# Patient Record
Sex: Female | Born: 1945
Health system: Southern US, Community
[De-identification: ages and names within clinical notes are randomized; demographics above are authoritative.]

## PROBLEM LIST (undated history)

## (undated) DIAGNOSIS — C50919 Malignant neoplasm of unspecified site of unspecified female breast: Secondary | ICD-10-CM

## (undated) DIAGNOSIS — Z923 Personal history of irradiation: Secondary | ICD-10-CM

## (undated) DIAGNOSIS — Z853 Personal history of malignant neoplasm of breast: Secondary | ICD-10-CM

## (undated) DIAGNOSIS — R569 Unspecified convulsions: Secondary | ICD-10-CM

## (undated) DIAGNOSIS — I10 Essential (primary) hypertension: Secondary | ICD-10-CM

## (undated) DIAGNOSIS — Z803 Family history of malignant neoplasm of breast: Secondary | ICD-10-CM

## (undated) DIAGNOSIS — Z9221 Personal history of antineoplastic chemotherapy: Secondary | ICD-10-CM

## (undated) DIAGNOSIS — T7840XA Allergy, unspecified, initial encounter: Secondary | ICD-10-CM

## (undated) DIAGNOSIS — Z8041 Family history of malignant neoplasm of ovary: Secondary | ICD-10-CM

## (undated) DIAGNOSIS — C801 Malignant (primary) neoplasm, unspecified: Secondary | ICD-10-CM

## (undated) HISTORY — PX: BREAST SURGERY: SHX581

## (undated) HISTORY — DX: Family history of malignant neoplasm of breast: Z80.3

## (undated) HISTORY — DX: Family history of malignant neoplasm of ovary: Z80.41

## (undated) HISTORY — PX: ABDOMINAL HYSTERECTOMY: SHX81

## (undated) HISTORY — DX: Malignant (primary) neoplasm, unspecified: C80.1

## (undated) HISTORY — DX: Allergy, unspecified, initial encounter: T78.40XA

## (undated) HISTORY — DX: Essential (primary) hypertension: I10

## (undated) HISTORY — PX: JOINT REPLACEMENT: SHX530

## (undated) HISTORY — DX: Unspecified convulsions: R56.9

## (undated) HISTORY — PX: SPINE SURGERY: SHX786

## (undated) HISTORY — PX: BREAST EXCISIONAL BIOPSY: SUR124

## (undated) HISTORY — DX: Personal history of malignant neoplasm of breast: Z85.3

---

## 1998-05-24 ENCOUNTER — Ambulatory Visit (HOSPITAL_COMMUNITY): Admission: RE | Admit: 1998-05-24 | Discharge: 1998-05-24 | Payer: Self-pay | Admitting: Internal Medicine

## 1998-06-14 ENCOUNTER — Inpatient Hospital Stay (HOSPITAL_COMMUNITY): Admission: RE | Admit: 1998-06-14 | Discharge: 1998-06-16 | Payer: Self-pay | Admitting: Neurosurgery

## 1999-02-15 ENCOUNTER — Ambulatory Visit (HOSPITAL_COMMUNITY): Admission: RE | Admit: 1999-02-15 | Discharge: 1999-02-15 | Payer: Self-pay | Admitting: Gynecology

## 1999-02-15 ENCOUNTER — Encounter: Payer: Self-pay | Admitting: Gynecology

## 1999-05-31 ENCOUNTER — Encounter: Payer: Self-pay | Admitting: Emergency Medicine

## 1999-05-31 ENCOUNTER — Emergency Department (HOSPITAL_COMMUNITY): Admission: EM | Admit: 1999-05-31 | Discharge: 1999-05-31 | Payer: Self-pay | Admitting: Emergency Medicine

## 1999-06-21 ENCOUNTER — Encounter: Admission: RE | Admit: 1999-06-21 | Discharge: 1999-08-05 | Payer: Self-pay | Admitting: Neurosurgery

## 1999-09-20 ENCOUNTER — Encounter: Payer: Self-pay | Admitting: Emergency Medicine

## 1999-09-20 ENCOUNTER — Inpatient Hospital Stay (HOSPITAL_COMMUNITY): Admission: EM | Admit: 1999-09-20 | Discharge: 1999-09-22 | Payer: Self-pay | Admitting: Emergency Medicine

## 1999-09-21 ENCOUNTER — Encounter: Payer: Self-pay | Admitting: Internal Medicine

## 2000-02-20 ENCOUNTER — Encounter: Payer: Self-pay | Admitting: Internal Medicine

## 2000-02-20 ENCOUNTER — Ambulatory Visit (HOSPITAL_COMMUNITY): Admission: RE | Admit: 2000-02-20 | Discharge: 2000-02-20 | Payer: Self-pay | Admitting: Internal Medicine

## 2001-05-27 ENCOUNTER — Ambulatory Visit (HOSPITAL_COMMUNITY): Admission: RE | Admit: 2001-05-27 | Discharge: 2001-05-27 | Payer: Self-pay | Admitting: Gynecology

## 2001-05-27 ENCOUNTER — Encounter: Payer: Self-pay | Admitting: Gynecology

## 2001-06-06 ENCOUNTER — Other Ambulatory Visit: Admission: RE | Admit: 2001-06-06 | Discharge: 2001-06-06 | Payer: Self-pay | Admitting: Gynecology

## 2001-06-27 ENCOUNTER — Encounter: Payer: Self-pay | Admitting: Internal Medicine

## 2001-06-27 ENCOUNTER — Ambulatory Visit (HOSPITAL_COMMUNITY): Admission: RE | Admit: 2001-06-27 | Discharge: 2001-06-27 | Payer: Self-pay | Admitting: Internal Medicine

## 2003-01-27 ENCOUNTER — Ambulatory Visit (HOSPITAL_COMMUNITY): Admission: RE | Admit: 2003-01-27 | Discharge: 2003-01-27 | Payer: Self-pay | Admitting: Internal Medicine

## 2003-01-27 ENCOUNTER — Encounter: Payer: Self-pay | Admitting: Internal Medicine

## 2003-05-21 ENCOUNTER — Other Ambulatory Visit: Admission: RE | Admit: 2003-05-21 | Discharge: 2003-05-21 | Payer: Self-pay | Admitting: Gynecology

## 2003-05-21 ENCOUNTER — Ambulatory Visit (HOSPITAL_COMMUNITY): Admission: RE | Admit: 2003-05-21 | Discharge: 2003-05-21 | Payer: Self-pay | Admitting: Gynecology

## 2003-05-21 ENCOUNTER — Encounter: Payer: Self-pay | Admitting: Gynecology

## 2003-11-22 ENCOUNTER — Emergency Department (HOSPITAL_COMMUNITY): Admission: AD | Admit: 2003-11-22 | Discharge: 2003-11-22 | Payer: Self-pay | Admitting: Internal Medicine

## 2004-07-04 ENCOUNTER — Ambulatory Visit (HOSPITAL_COMMUNITY): Admission: RE | Admit: 2004-07-04 | Discharge: 2004-07-04 | Payer: Self-pay | Admitting: Gynecology

## 2004-09-29 ENCOUNTER — Emergency Department (HOSPITAL_COMMUNITY): Admission: EM | Admit: 2004-09-29 | Discharge: 2004-09-29 | Payer: Self-pay | Admitting: Family Medicine

## 2004-11-17 ENCOUNTER — Other Ambulatory Visit: Admission: RE | Admit: 2004-11-17 | Discharge: 2004-11-17 | Payer: Self-pay | Admitting: Gynecology

## 2005-02-01 ENCOUNTER — Encounter: Admission: RE | Admit: 2005-02-01 | Discharge: 2005-03-28 | Payer: Self-pay | Admitting: Orthopedic Surgery

## 2005-03-30 ENCOUNTER — Encounter: Admission: RE | Admit: 2005-03-30 | Discharge: 2005-03-30 | Payer: Self-pay | Admitting: Orthopaedic Surgery

## 2005-06-21 ENCOUNTER — Encounter: Admission: RE | Admit: 2005-06-21 | Discharge: 2005-07-13 | Payer: Self-pay | Admitting: Orthopedic Surgery

## 2005-07-14 ENCOUNTER — Ambulatory Visit (HOSPITAL_COMMUNITY): Admission: RE | Admit: 2005-07-14 | Discharge: 2005-07-14 | Payer: Self-pay | Admitting: Gynecology

## 2005-12-10 ENCOUNTER — Emergency Department (HOSPITAL_COMMUNITY): Admission: EM | Admit: 2005-12-10 | Discharge: 2005-12-10 | Payer: Self-pay | Admitting: Family Medicine

## 2005-12-10 ENCOUNTER — Emergency Department (HOSPITAL_COMMUNITY): Admission: EM | Admit: 2005-12-10 | Discharge: 2005-12-11 | Payer: Self-pay | Admitting: Emergency Medicine

## 2005-12-27 ENCOUNTER — Encounter: Payer: Self-pay | Admitting: Internal Medicine

## 2005-12-27 ENCOUNTER — Encounter: Payer: Self-pay | Admitting: Neurosurgery

## 2006-06-08 ENCOUNTER — Ambulatory Visit (HOSPITAL_COMMUNITY): Admission: RE | Admit: 2006-06-08 | Discharge: 2006-06-08 | Payer: Self-pay | Admitting: Internal Medicine

## 2006-07-05 ENCOUNTER — Inpatient Hospital Stay (HOSPITAL_COMMUNITY): Admission: RE | Admit: 2006-07-05 | Discharge: 2006-07-09 | Payer: Self-pay | Admitting: Orthopedic Surgery

## 2006-09-04 ENCOUNTER — Ambulatory Visit (HOSPITAL_COMMUNITY): Admission: RE | Admit: 2006-09-04 | Discharge: 2006-09-04 | Payer: Self-pay | Admitting: Gynecology

## 2006-09-06 ENCOUNTER — Other Ambulatory Visit: Admission: RE | Admit: 2006-09-06 | Discharge: 2006-09-06 | Payer: Self-pay | Admitting: Gynecology

## 2006-09-12 ENCOUNTER — Encounter: Admission: RE | Admit: 2006-09-12 | Discharge: 2006-09-12 | Payer: Self-pay | Admitting: Gynecology

## 2006-10-01 ENCOUNTER — Ambulatory Visit (HOSPITAL_COMMUNITY): Admission: RE | Admit: 2006-10-01 | Discharge: 2006-10-01 | Payer: Self-pay | Admitting: Internal Medicine

## 2007-03-08 ENCOUNTER — Encounter: Admission: RE | Admit: 2007-03-08 | Discharge: 2007-03-08 | Payer: Self-pay | Admitting: Gynecology

## 2007-04-12 ENCOUNTER — Ambulatory Visit (HOSPITAL_COMMUNITY): Admission: RE | Admit: 2007-04-12 | Discharge: 2007-04-12 | Payer: Self-pay | Admitting: Internal Medicine

## 2007-04-14 ENCOUNTER — Emergency Department (HOSPITAL_COMMUNITY): Admission: EM | Admit: 2007-04-14 | Discharge: 2007-04-14 | Payer: Self-pay | Admitting: Emergency Medicine

## 2007-08-29 HISTORY — PX: BREAST LUMPECTOMY: SHX2

## 2008-03-08 ENCOUNTER — Emergency Department (HOSPITAL_COMMUNITY): Admission: EM | Admit: 2008-03-08 | Discharge: 2008-03-08 | Payer: Self-pay | Admitting: Family Medicine

## 2008-03-12 ENCOUNTER — Encounter (INDEPENDENT_AMBULATORY_CARE_PROVIDER_SITE_OTHER): Payer: Self-pay | Admitting: Gynecology

## 2008-03-12 ENCOUNTER — Encounter: Admission: RE | Admit: 2008-03-12 | Discharge: 2008-03-12 | Payer: Self-pay | Admitting: Gynecology

## 2008-03-19 ENCOUNTER — Encounter: Admission: RE | Admit: 2008-03-19 | Discharge: 2008-03-19 | Payer: Self-pay | Admitting: Gynecology

## 2008-04-06 ENCOUNTER — Encounter: Admission: RE | Admit: 2008-04-06 | Discharge: 2008-04-06 | Payer: Self-pay | Admitting: Surgery

## 2008-04-08 ENCOUNTER — Ambulatory Visit (HOSPITAL_BASED_OUTPATIENT_CLINIC_OR_DEPARTMENT_OTHER): Admission: RE | Admit: 2008-04-08 | Discharge: 2008-04-08 | Payer: Self-pay | Admitting: Surgery

## 2008-04-08 ENCOUNTER — Encounter: Admission: RE | Admit: 2008-04-08 | Discharge: 2008-04-08 | Payer: Self-pay | Admitting: Surgery

## 2008-04-08 ENCOUNTER — Encounter (INDEPENDENT_AMBULATORY_CARE_PROVIDER_SITE_OTHER): Payer: Self-pay | Admitting: Surgery

## 2008-04-09 ENCOUNTER — Ambulatory Visit: Payer: Self-pay | Admitting: Oncology

## 2008-04-21 ENCOUNTER — Ambulatory Visit: Admission: RE | Admit: 2008-04-21 | Discharge: 2008-07-20 | Payer: Self-pay | Admitting: Radiation Oncology

## 2008-04-22 LAB — CBC WITH DIFFERENTIAL/PLATELET
BASO%: 0.5 % (ref 0.0–2.0)
Basophils Absolute: 0 10*3/uL (ref 0.0–0.1)
EOS%: 1.1 % (ref 0.0–7.0)
MCH: 31.2 pg (ref 26.0–34.0)
MCHC: 33.6 g/dL (ref 32.0–36.0)
MCV: 92.8 fL (ref 81.0–101.0)
MONO%: 4.6 % (ref 0.0–13.0)
RBC: 4.4 10*6/uL (ref 3.70–5.32)
RDW: 12.3 % (ref 11.3–14.5)

## 2008-04-23 LAB — COMPREHENSIVE METABOLIC PANEL
AST: 12 U/L (ref 0–37)
Albumin: 4.1 g/dL (ref 3.5–5.2)
Alkaline Phosphatase: 78 U/L (ref 39–117)
BUN: 14 mg/dL (ref 6–23)
Potassium: 4 mEq/L (ref 3.5–5.3)

## 2008-04-23 LAB — VITAMIN D 25 HYDROXY (VIT D DEFICIENCY, FRACTURES): Vit D, 25-Hydroxy: 19 ng/mL — ABNORMAL LOW (ref 30–89)

## 2008-04-24 ENCOUNTER — Ambulatory Visit (HOSPITAL_COMMUNITY): Admission: RE | Admit: 2008-04-24 | Discharge: 2008-04-24 | Payer: Self-pay | Admitting: Oncology

## 2008-05-08 ENCOUNTER — Ambulatory Visit: Payer: Self-pay

## 2008-05-08 ENCOUNTER — Encounter: Payer: Self-pay | Admitting: Oncology

## 2008-05-11 ENCOUNTER — Ambulatory Visit (HOSPITAL_COMMUNITY): Admission: RE | Admit: 2008-05-11 | Discharge: 2008-05-11 | Payer: Self-pay | Admitting: Surgery

## 2008-05-20 ENCOUNTER — Emergency Department (HOSPITAL_COMMUNITY): Admission: EM | Admit: 2008-05-20 | Discharge: 2008-05-20 | Payer: Self-pay | Admitting: Emergency Medicine

## 2008-05-28 ENCOUNTER — Ambulatory Visit: Payer: Self-pay | Admitting: Oncology

## 2008-05-28 ENCOUNTER — Ambulatory Visit (HOSPITAL_COMMUNITY): Admission: RE | Admit: 2008-05-28 | Discharge: 2008-05-28 | Payer: Self-pay | Admitting: Oncology

## 2008-05-28 LAB — CBC WITH DIFFERENTIAL/PLATELET
BASO%: 0.9 % (ref 0.0–2.0)
Eosinophils Absolute: 0 10*3/uL (ref 0.0–0.5)
HCT: 39.2 % (ref 34.8–46.6)
HGB: 13.2 g/dL (ref 11.6–15.9)
LYMPH%: 25.8 % (ref 14.0–48.0)
MCH: 30.5 pg (ref 26.0–34.0)
MONO%: 5.3 % (ref 0.0–13.0)
Platelets: 157 10*3/uL (ref 145–400)

## 2008-06-01 ENCOUNTER — Ambulatory Visit (HOSPITAL_COMMUNITY): Admission: RE | Admit: 2008-06-01 | Discharge: 2008-06-01 | Payer: Self-pay | Admitting: General Surgery

## 2008-06-11 LAB — CBC WITH DIFFERENTIAL/PLATELET
BASO%: 1 % (ref 0.0–2.0)
Basophils Absolute: 0.1 10*3/uL (ref 0.0–0.1)
EOS%: 0.5 % (ref 0.0–7.0)
HCT: 36 % (ref 34.8–46.6)
HGB: 12.2 g/dL (ref 11.6–15.9)
LYMPH%: 25.2 % (ref 14.0–48.0)
MCH: 31.1 pg (ref 26.0–34.0)
MCHC: 33.9 g/dL (ref 32.0–36.0)
MCV: 91.8 fL (ref 81.0–101.0)
MONO%: 6.5 % (ref 0.0–13.0)
NEUT%: 66.8 % (ref 39.6–76.8)
Platelets: 90 10*3/uL — ABNORMAL LOW (ref 145–400)
lymph#: 2 10*3/uL (ref 0.9–3.3)

## 2008-06-18 LAB — CBC WITH DIFFERENTIAL/PLATELET
BASO%: 0.9 % (ref 0.0–2.0)
Basophils Absolute: 0 10*3/uL (ref 0.0–0.1)
EOS%: 0.3 % (ref 0.0–7.0)
HGB: 12.6 g/dL (ref 11.6–15.9)
MCH: 31.7 pg (ref 26.0–34.0)
MCHC: 33.6 g/dL (ref 32.0–36.0)
MCV: 94.1 fL (ref 81.0–101.0)
MONO%: 4.8 % (ref 0.0–13.0)
RDW: 14.9 % — ABNORMAL HIGH (ref 11.3–14.5)

## 2008-06-25 LAB — CBC WITH DIFFERENTIAL/PLATELET
EOS%: 0.2 % (ref 0.0–7.0)
Eosinophils Absolute: 0 10*3/uL (ref 0.0–0.5)
MCH: 31.8 pg (ref 26.0–34.0)
MCV: 95.4 fL (ref 81.0–101.0)
MONO%: 5.2 % (ref 0.0–13.0)
NEUT#: 3.9 10*3/uL (ref 1.5–6.5)
RBC: 4 10*6/uL (ref 3.70–5.32)
RDW: 15.3 % — ABNORMAL HIGH (ref 11.3–14.5)
lymph#: 1.6 10*3/uL (ref 0.9–3.3)

## 2008-06-25 LAB — COMPREHENSIVE METABOLIC PANEL
ALT: 19 U/L (ref 0–35)
AST: 20 U/L (ref 0–37)
Albumin: 4.2 g/dL (ref 3.5–5.2)
Alkaline Phosphatase: 84 U/L (ref 39–117)
Potassium: 3.9 mEq/L (ref 3.5–5.3)
Sodium: 138 mEq/L (ref 135–145)
Total Protein: 6.7 g/dL (ref 6.0–8.3)

## 2008-07-02 LAB — CBC WITH DIFFERENTIAL/PLATELET
BASO%: 1.6 % (ref 0.0–2.0)
Eosinophils Absolute: 0.1 10*3/uL (ref 0.0–0.5)
LYMPH%: 42.9 % (ref 14.0–48.0)
MCHC: 33.4 g/dL (ref 32.0–36.0)
MONO#: 0.3 10*3/uL (ref 0.1–0.9)
NEUT#: 2.8 10*3/uL (ref 1.5–6.5)
RBC: 3.93 10*6/uL (ref 3.70–5.32)
RDW: 14.5 % (ref 11.3–14.5)
WBC: 5.7 10*3/uL (ref 3.9–10.0)

## 2008-07-09 LAB — CBC WITH DIFFERENTIAL/PLATELET
BASO%: 1.3 % (ref 0.0–2.0)
Eosinophils Absolute: 0 10*3/uL (ref 0.0–0.5)
HCT: 37.1 % (ref 34.8–46.6)
LYMPH%: 44.8 % (ref 14.0–48.0)
MCHC: 33.7 g/dL (ref 32.0–36.0)
MONO#: 0.3 10*3/uL (ref 0.1–0.9)
NEUT#: 2.4 10*3/uL (ref 1.5–6.5)
NEUT%: 47.1 % (ref 39.6–76.8)
Platelets: 289 10*3/uL (ref 145–400)
RBC: 3.86 10*6/uL (ref 3.70–5.32)
WBC: 5 10*3/uL (ref 3.9–10.0)
lymph#: 2.2 10*3/uL (ref 0.9–3.3)

## 2008-07-14 ENCOUNTER — Ambulatory Visit: Payer: Self-pay | Admitting: Oncology

## 2008-07-16 LAB — CBC WITH DIFFERENTIAL/PLATELET
Basophils Absolute: 0.1 10*3/uL (ref 0.0–0.1)
HCT: 34.9 % (ref 34.8–46.6)
HGB: 11.9 g/dL (ref 11.6–15.9)
LYMPH%: 40.5 % (ref 14.0–48.0)
MONO#: 0.3 10*3/uL (ref 0.1–0.9)
NEUT%: 52.5 % (ref 39.6–76.8)
Platelets: 230 10*3/uL (ref 145–400)
WBC: 5.5 10*3/uL (ref 3.9–10.0)
lymph#: 2.2 10*3/uL (ref 0.9–3.3)

## 2008-07-16 LAB — COMPREHENSIVE METABOLIC PANEL
ALT: 26 U/L (ref 0–35)
BUN: 16 mg/dL (ref 6–23)
CO2: 24 mEq/L (ref 19–32)
Calcium: 8.9 mg/dL (ref 8.4–10.5)
Chloride: 104 mEq/L (ref 96–112)
Creatinine, Ser: 0.96 mg/dL (ref 0.40–1.20)
Glucose, Bld: 152 mg/dL — ABNORMAL HIGH (ref 70–99)
Total Bilirubin: 0.3 mg/dL (ref 0.3–1.2)

## 2008-07-21 LAB — CBC WITH DIFFERENTIAL/PLATELET
BASO%: 1.5 % (ref 0.0–2.0)
Eosinophils Absolute: 0.1 10*3/uL (ref 0.0–0.5)
HCT: 36 % (ref 34.8–46.6)
HGB: 12.4 g/dL (ref 11.6–15.9)
MCHC: 34.3 g/dL (ref 32.0–36.0)
MONO#: 0.4 10*3/uL (ref 0.1–0.9)
NEUT#: 2.3 10*3/uL (ref 1.5–6.5)
NEUT%: 46.3 % (ref 39.6–76.8)
WBC: 4.9 10*3/uL (ref 3.9–10.0)
lymph#: 2.1 10*3/uL (ref 0.9–3.3)

## 2008-07-30 LAB — CBC WITH DIFFERENTIAL/PLATELET
Basophils Absolute: 0 10*3/uL (ref 0.0–0.1)
HCT: 33.9 % — ABNORMAL LOW (ref 34.8–46.6)
HGB: 11.8 g/dL (ref 11.6–15.9)
LYMPH%: 35.6 % (ref 14.0–48.0)
MONO#: 0.2 10*3/uL (ref 0.1–0.9)
NEUT%: 58.5 % (ref 39.6–76.8)
Platelets: 84 10*3/uL — ABNORMAL LOW (ref 145–400)
WBC: 5.1 10*3/uL (ref 3.9–10.0)
lymph#: 1.8 10*3/uL (ref 0.9–3.3)

## 2008-08-06 ENCOUNTER — Ambulatory Visit: Admission: RE | Admit: 2008-08-06 | Discharge: 2008-09-03 | Payer: Self-pay | Admitting: Radiation Oncology

## 2008-08-13 LAB — CBC WITH DIFFERENTIAL/PLATELET
Basophils Absolute: 0 10*3/uL (ref 0.0–0.1)
EOS%: 0.8 % (ref 0.0–7.0)
HCT: 30.9 % — ABNORMAL LOW (ref 34.8–46.6)
HGB: 10.6 g/dL — ABNORMAL LOW (ref 11.6–15.9)
LYMPH%: 63 % — ABNORMAL HIGH (ref 14.0–48.0)
MCH: 33.9 pg (ref 26.0–34.0)
MCV: 98.8 fL (ref 81.0–101.0)
MONO%: 4.9 % (ref 0.0–13.0)
NEUT%: 30.3 % — ABNORMAL LOW (ref 39.6–76.8)
Platelets: 307 10*3/uL (ref 145–400)

## 2008-08-19 ENCOUNTER — Ambulatory Visit: Payer: Self-pay

## 2008-08-19 ENCOUNTER — Encounter: Payer: Self-pay | Admitting: Oncology

## 2008-09-01 ENCOUNTER — Ambulatory Visit: Payer: Self-pay | Admitting: Oncology

## 2008-09-03 ENCOUNTER — Ambulatory Visit: Admission: RE | Admit: 2008-09-03 | Discharge: 2008-11-05 | Payer: Self-pay | Admitting: Radiation Oncology

## 2008-09-03 LAB — CBC WITH DIFFERENTIAL/PLATELET
BASO%: 0.5 % (ref 0.0–2.0)
EOS%: 0.4 % (ref 0.0–7.0)
HCT: 36.9 % (ref 34.8–46.6)
MCHC: 33.5 g/dL (ref 32.0–36.0)
MONO#: 0.4 10*3/uL (ref 0.1–0.9)
RBC: 3.49 10*6/uL — ABNORMAL LOW (ref 3.70–5.32)
RDW: 16.7 % — ABNORMAL HIGH (ref 11.3–14.5)
WBC: 6.7 10*3/uL (ref 3.9–10.0)
lymph#: 2.1 10*3/uL (ref 0.9–3.3)

## 2008-09-03 LAB — COMPREHENSIVE METABOLIC PANEL
ALT: 21 U/L (ref 0–35)
Albumin: 4.3 g/dL (ref 3.5–5.2)
CO2: 23 mEq/L (ref 19–32)
Calcium: 9.6 mg/dL (ref 8.4–10.5)
Chloride: 102 mEq/L (ref 96–112)
Creatinine, Ser: 0.83 mg/dL (ref 0.40–1.20)
Potassium: 3.9 mEq/L (ref 3.5–5.3)
Total Protein: 7 g/dL (ref 6.0–8.3)

## 2008-09-10 LAB — CBC WITH DIFFERENTIAL/PLATELET
Basophils Absolute: 0 10*3/uL (ref 0.0–0.1)
HCT: 37.7 % (ref 34.8–46.6)
HGB: 12.6 g/dL (ref 11.6–15.9)
MONO#: 0.1 10*3/uL (ref 0.1–0.9)
NEUT#: 5.5 10*3/uL (ref 1.5–6.5)
NEUT%: 72.5 % (ref 39.6–76.8)
RDW: 15.5 % — ABNORMAL HIGH (ref 11.3–14.5)
WBC: 7.6 10*3/uL (ref 3.9–10.0)
lymph#: 1.9 10*3/uL (ref 0.9–3.3)

## 2008-09-17 LAB — CBC WITH DIFFERENTIAL/PLATELET
BASO%: 0.1 % (ref 0.0–2.0)
Basophils Absolute: 0 10*3/uL (ref 0.0–0.1)
Eosinophils Absolute: 0.1 10*3/uL (ref 0.0–0.5)
HCT: 38.7 % (ref 34.8–46.6)
HGB: 12.9 g/dL (ref 11.6–15.9)
LYMPH%: 25.4 % (ref 14.0–48.0)
MCHC: 33.3 g/dL (ref 32.0–36.0)
MONO#: 0.3 10*3/uL (ref 0.1–0.9)
NEUT#: 5.7 10*3/uL (ref 1.5–6.5)
NEUT%: 69.5 % (ref 39.6–76.8)
Platelets: 212 10*3/uL (ref 145–400)
WBC: 8.2 10*3/uL (ref 3.9–10.0)
lymph#: 2.1 10*3/uL (ref 0.9–3.3)

## 2008-09-24 LAB — CBC WITH DIFFERENTIAL/PLATELET
BASO%: 0.4 % (ref 0.0–2.0)
Basophils Absolute: 0 10*3/uL (ref 0.0–0.1)
EOS%: 1.3 % (ref 0.0–7.0)
HCT: 37.8 % (ref 34.8–46.6)
HGB: 12.6 g/dL (ref 11.6–15.9)
LYMPH%: 24.6 % (ref 14.0–48.0)
MCH: 34.6 pg — ABNORMAL HIGH (ref 26.0–34.0)
MCHC: 33.5 g/dL (ref 32.0–36.0)
MCV: 103.4 fL — ABNORMAL HIGH (ref 81.0–101.0)
NEUT%: 65.9 % (ref 39.6–76.8)
Platelets: 174 10*3/uL (ref 145–400)
lymph#: 1.8 10*3/uL (ref 0.9–3.3)

## 2008-09-24 LAB — COMPREHENSIVE METABOLIC PANEL
AST: 19 U/L (ref 0–37)
BUN: 17 mg/dL (ref 6–23)
Calcium: 9.5 mg/dL (ref 8.4–10.5)
Chloride: 106 mEq/L (ref 96–112)
Creatinine, Ser: 1 mg/dL (ref 0.40–1.20)
Total Bilirubin: 0.2 mg/dL — ABNORMAL LOW (ref 0.3–1.2)

## 2008-10-15 LAB — CBC WITH DIFFERENTIAL/PLATELET
Eosinophils Absolute: 0.1 10*3/uL (ref 0.0–0.5)
HCT: 38.3 % (ref 34.8–46.6)
LYMPH%: 20 % (ref 14.0–48.0)
MONO#: 0.3 10*3/uL (ref 0.1–0.9)
NEUT#: 3.5 10*3/uL (ref 1.5–6.5)
NEUT%: 71.6 % (ref 39.6–76.8)
Platelets: 159 10*3/uL (ref 145–400)
WBC: 4.8 10*3/uL (ref 3.9–10.0)

## 2008-10-20 ENCOUNTER — Ambulatory Visit: Payer: Self-pay | Admitting: Oncology

## 2008-10-22 LAB — COMPREHENSIVE METABOLIC PANEL
AST: 13 U/L (ref 0–37)
Alkaline Phosphatase: 78 U/L (ref 39–117)
BUN: 17 mg/dL (ref 6–23)
Calcium: 9.6 mg/dL (ref 8.4–10.5)
Chloride: 103 mEq/L (ref 96–112)
Creatinine, Ser: 0.79 mg/dL (ref 0.40–1.20)

## 2008-10-22 LAB — CBC WITH DIFFERENTIAL/PLATELET
BASO%: 0.6 % (ref 0.0–2.0)
EOS%: 1 % (ref 0.0–7.0)
HCT: 38.4 % (ref 34.8–46.6)
LYMPH%: 18.8 % (ref 14.0–49.7)
MCH: 32.1 pg (ref 25.1–34.0)
MCHC: 33.6 g/dL (ref 31.5–36.0)
NEUT%: 71.8 % (ref 38.4–76.8)
Platelets: 159 10*3/uL (ref 145–400)

## 2008-10-29 LAB — CBC WITH DIFFERENTIAL/PLATELET
Basophils Absolute: 0 10*3/uL (ref 0.0–0.1)
EOS%: 1.1 % (ref 0.0–7.0)
Eosinophils Absolute: 0.1 10*3/uL (ref 0.0–0.5)
HCT: 40.2 % (ref 34.8–46.6)
HGB: 13.6 g/dL (ref 11.6–15.9)
MCH: 33.2 pg (ref 25.1–34.0)
NEUT#: 3.8 10*3/uL (ref 1.5–6.5)
NEUT%: 71.6 % (ref 38.4–76.8)
lymph#: 1.2 10*3/uL (ref 0.9–3.3)

## 2008-11-10 ENCOUNTER — Encounter: Payer: Self-pay | Admitting: Oncology

## 2008-11-10 ENCOUNTER — Ambulatory Visit: Admission: RE | Admit: 2008-11-10 | Discharge: 2008-11-10 | Payer: Self-pay | Admitting: Oncology

## 2008-11-10 ENCOUNTER — Ambulatory Visit: Payer: Self-pay | Admitting: Cardiology

## 2008-11-12 LAB — COMPREHENSIVE METABOLIC PANEL
AST: 16 U/L (ref 0–37)
Albumin: 4.3 g/dL (ref 3.5–5.2)
BUN: 15 mg/dL (ref 6–23)
CO2: 23 mEq/L (ref 19–32)
Calcium: 9.1 mg/dL (ref 8.4–10.5)
Chloride: 106 mEq/L (ref 96–112)
Creatinine, Ser: 0.87 mg/dL (ref 0.40–1.20)
Glucose, Bld: 200 mg/dL — ABNORMAL HIGH (ref 70–99)

## 2008-11-12 LAB — CBC WITH DIFFERENTIAL/PLATELET
Basophils Absolute: 0 10*3/uL (ref 0.0–0.1)
Eosinophils Absolute: 0 10*3/uL (ref 0.0–0.5)
HCT: 40.3 % (ref 34.8–46.6)
HGB: 13.7 g/dL (ref 11.6–15.9)
MONO#: 0.4 10*3/uL (ref 0.1–0.9)
NEUT#: 3.5 10*3/uL (ref 1.5–6.5)
NEUT%: 68 % (ref 38.4–76.8)
RDW: 12.2 % (ref 11.2–14.5)
WBC: 5.2 10*3/uL (ref 3.9–10.3)
lymph#: 1.2 10*3/uL (ref 0.9–3.3)

## 2008-12-03 ENCOUNTER — Ambulatory Visit: Payer: Self-pay | Admitting: Oncology

## 2008-12-07 LAB — COMPREHENSIVE METABOLIC PANEL
AST: 14 U/L (ref 0–37)
Alkaline Phosphatase: 82 U/L (ref 39–117)
BUN: 16 mg/dL (ref 6–23)
Calcium: 9.9 mg/dL (ref 8.4–10.5)
Creatinine, Ser: 0.82 mg/dL (ref 0.40–1.20)

## 2008-12-07 LAB — CBC WITH DIFFERENTIAL/PLATELET
Basophils Absolute: 0 10*3/uL (ref 0.0–0.1)
EOS%: 1.1 % (ref 0.0–7.0)
MCH: 31.3 pg (ref 25.1–34.0)
MONO%: 6.9 % (ref 0.0–14.0)
NEUT#: 4.5 10*3/uL (ref 1.5–6.5)
NEUT%: 71.5 % (ref 38.4–76.8)
WBC: 6.3 10*3/uL (ref 3.9–10.3)

## 2008-12-09 ENCOUNTER — Encounter: Admission: RE | Admit: 2008-12-09 | Discharge: 2009-02-01 | Payer: Self-pay | Admitting: Oncology

## 2008-12-20 ENCOUNTER — Emergency Department (HOSPITAL_COMMUNITY): Admission: EM | Admit: 2008-12-20 | Discharge: 2008-12-20 | Payer: Self-pay | Admitting: Emergency Medicine

## 2008-12-27 IMAGING — CR DG CHEST 1V PORT
1 series · 1 of 1 positions shown · non-contrast
Comparison: Two-view chest x-ray 04/06/2008 and CT chest
04/24/2008.

CLINICAL DATA: Port-A-Cath placement.  History of breast cancer.

PORTABLE CHEST - 1 VIEW [DATE]/[id number] hours:

[view not recorded]
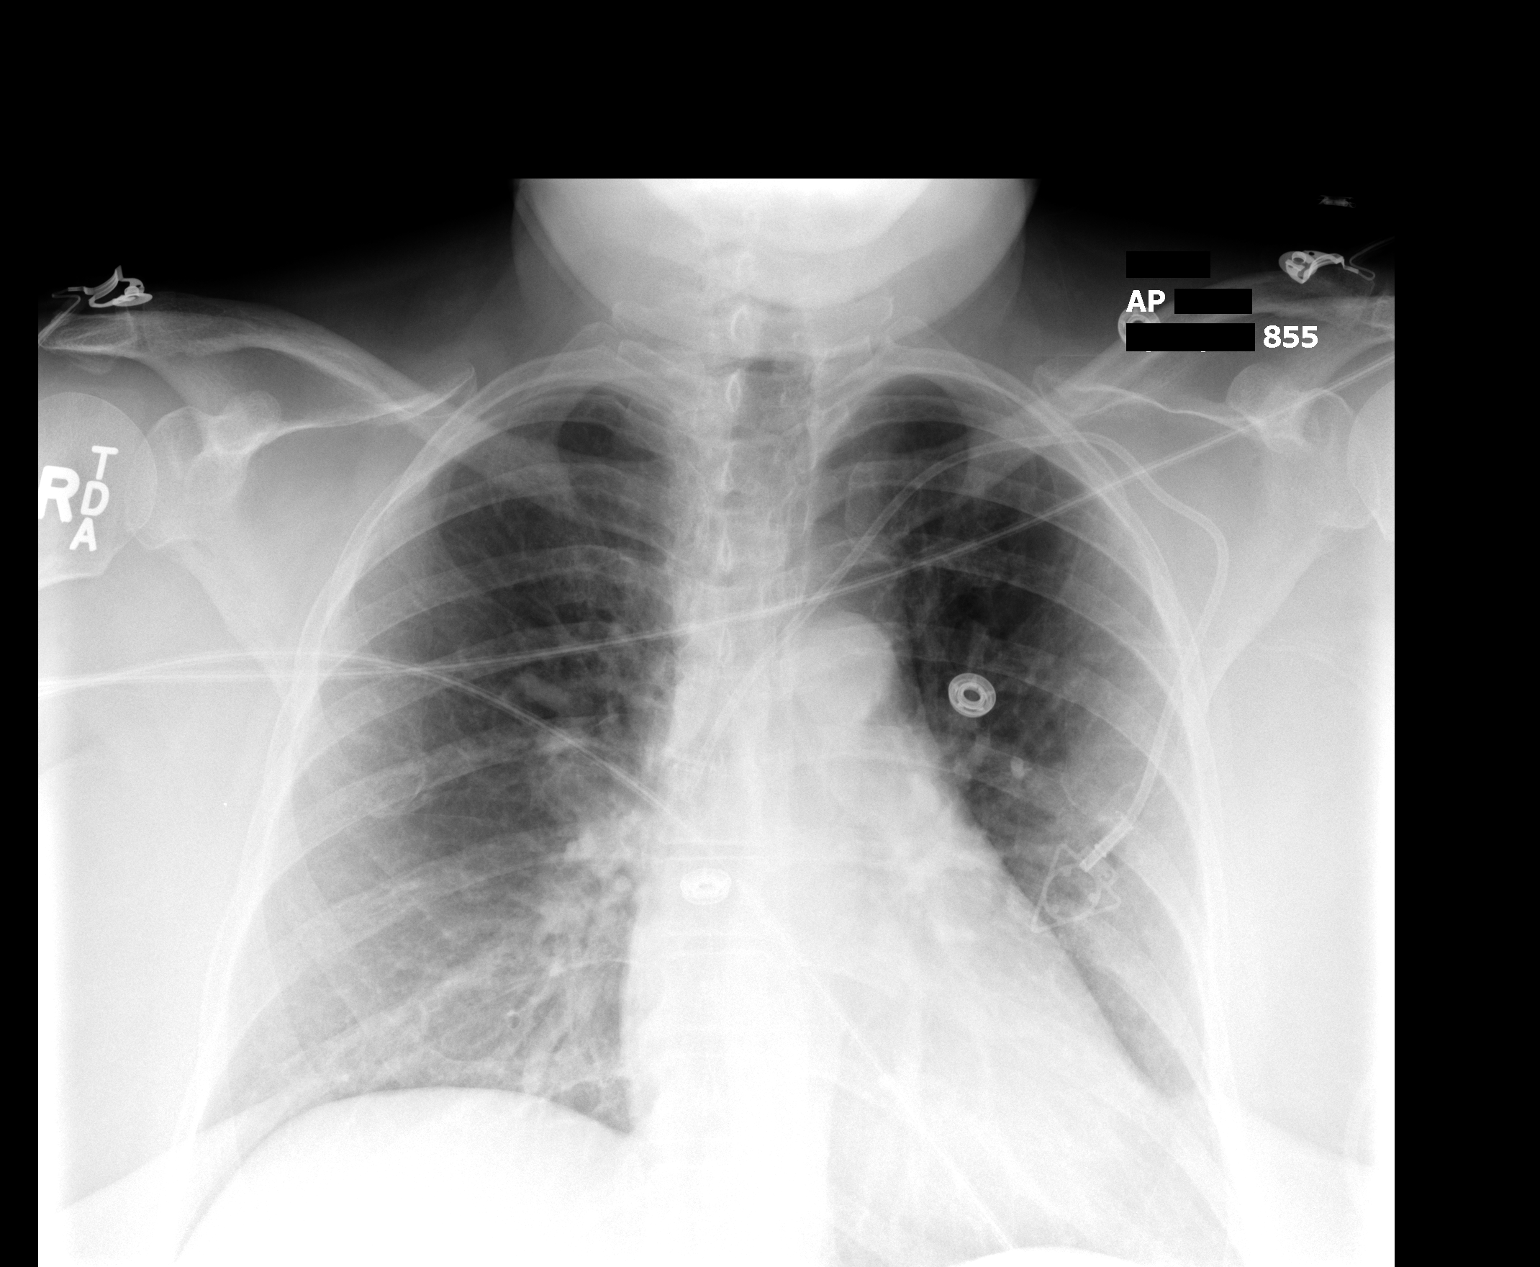

[1 of 1 positions shown; findings below may reference images not displayed]

FINDINGS: Heart size upper normal for technique.  Left subclavian
Port-A-Cath tip in the upper SVC.  No evidence of pneumothorax or
mediastinal hematoma.  Lungs clear.  No visible pleural effusions.
IMPRESSION: 1.  Left subclavian Port-A-Cath tip in the upper SVC.  No acute
complicating features.
2.  No acute cardiopulmonary disease.

## 2008-12-31 LAB — COMPREHENSIVE METABOLIC PANEL
ALT: 15 U/L (ref 0–35)
AST: 12 U/L (ref 0–37)
Albumin: 4.1 g/dL (ref 3.5–5.2)
Calcium: 9.3 mg/dL (ref 8.4–10.5)
Chloride: 104 mEq/L (ref 96–112)
Potassium: 3.8 mEq/L (ref 3.5–5.3)
Sodium: 139 mEq/L (ref 135–145)

## 2008-12-31 LAB — CBC WITH DIFFERENTIAL/PLATELET
BASO%: 0.3 % (ref 0.0–2.0)
EOS%: 1.3 % (ref 0.0–7.0)
MCH: 31.5 pg (ref 25.1–34.0)
MCHC: 34.5 g/dL (ref 31.5–36.0)
RDW: 12.7 % (ref 11.2–14.5)
lymph#: 1.6 10*3/uL (ref 0.9–3.3)

## 2008-12-31 LAB — CANCER ANTIGEN 27.29: CA 27.29: 19 U/mL (ref 0–39)

## 2009-01-13 IMAGING — RF IR CV CATH INJECTION
5 series · 14 of 16 positions shown · non-contrast
Comparison: No prior contrast injections.

CLINICAL DATA: No blood return for a Port-A-Cath.

CV CATH INJECTION

[Series 1: run · 1 of 1 slices shown (1 of 5)]
[im 1/1]
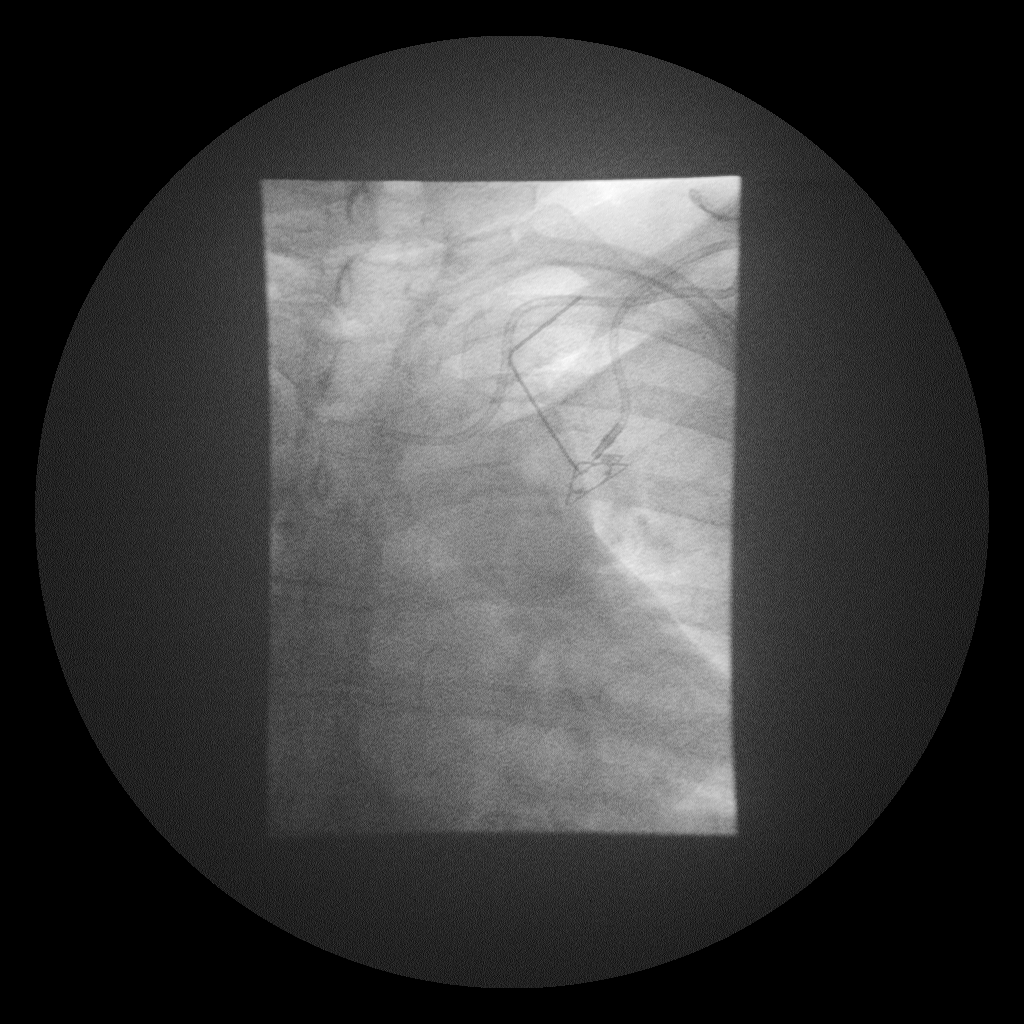

[Series 2: run · 1 of 2 slices shown (2 of 5)]
[im 1/2]
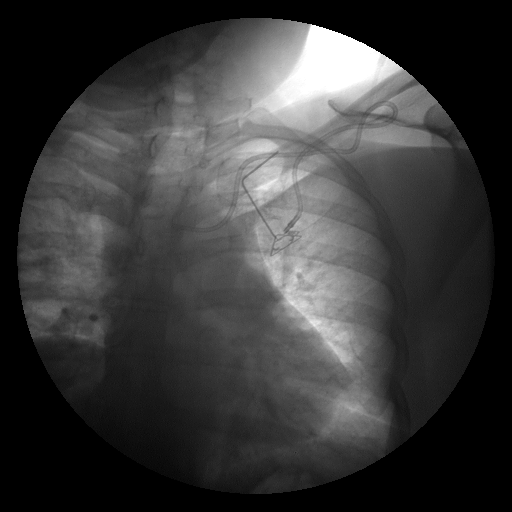

[Series 3: run · 1 of 1 slices shown (3 of 5)]
[im 1/1]
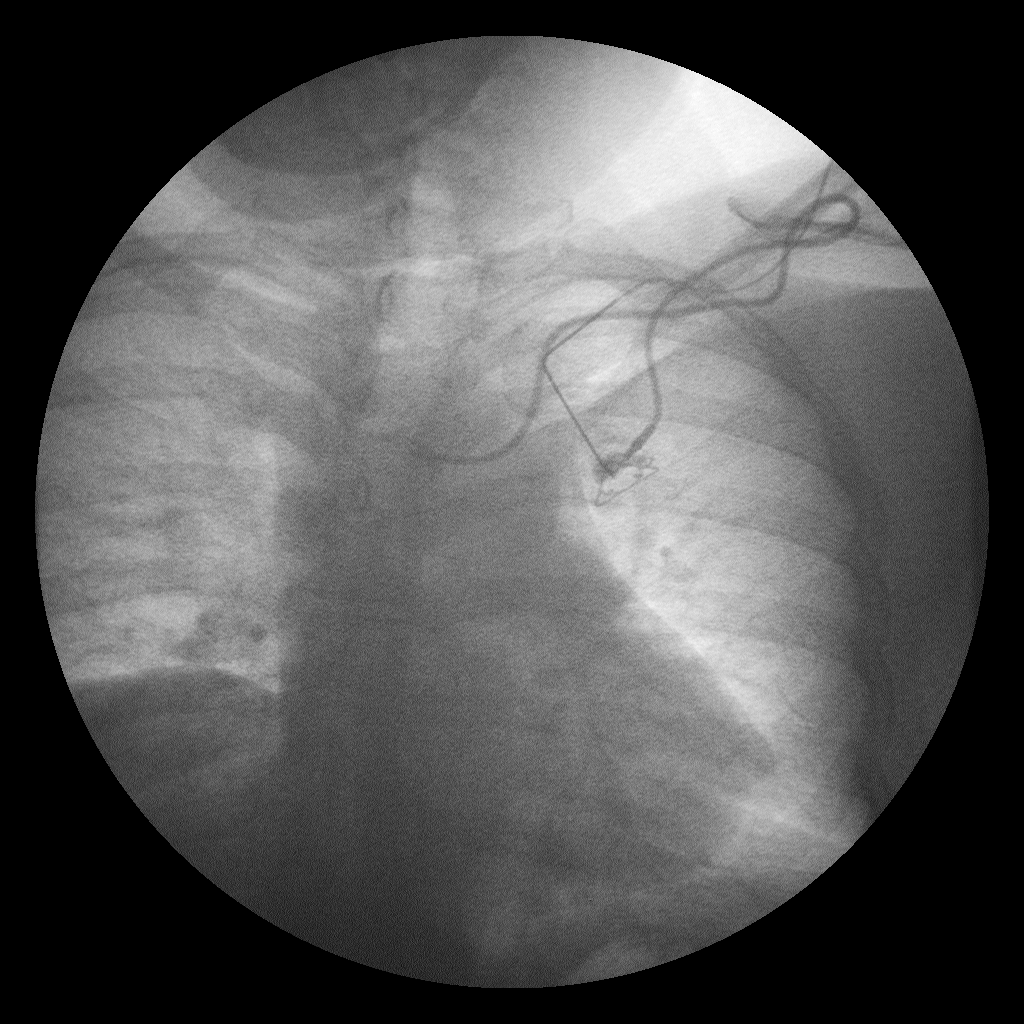

[Series 4: run · 6 of 15 slices shown (4 of 5)]
[im 3/15]
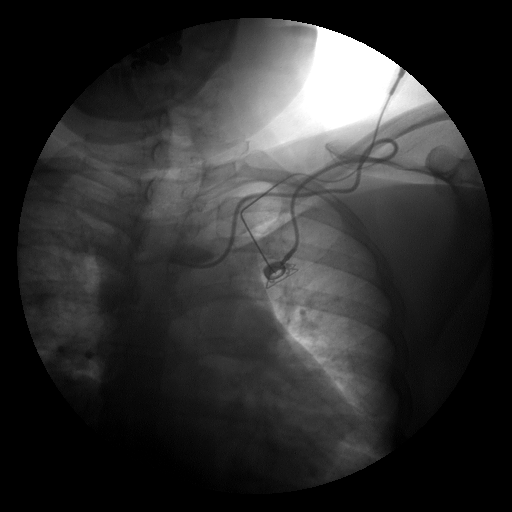
[im 5/15]
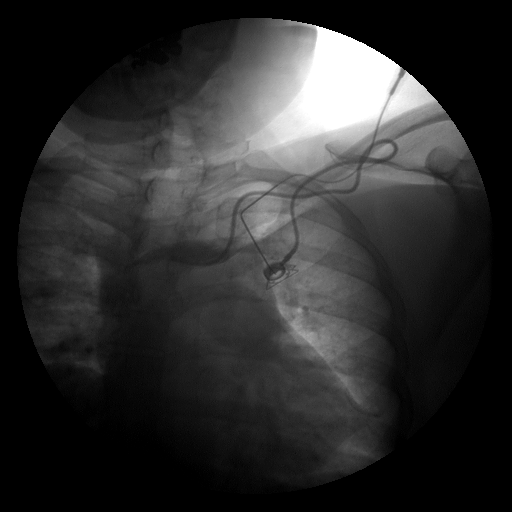
[im 8/15]
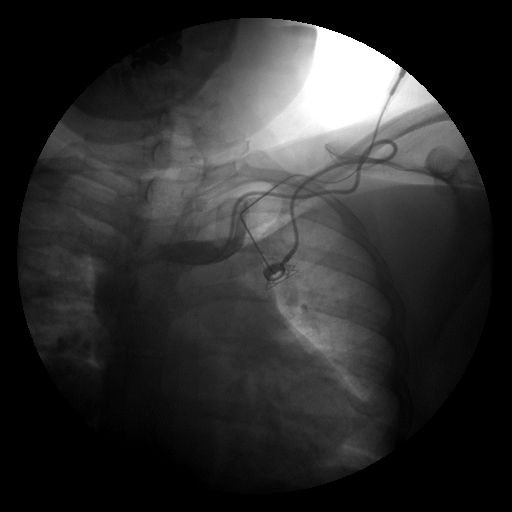
[im 10/15]
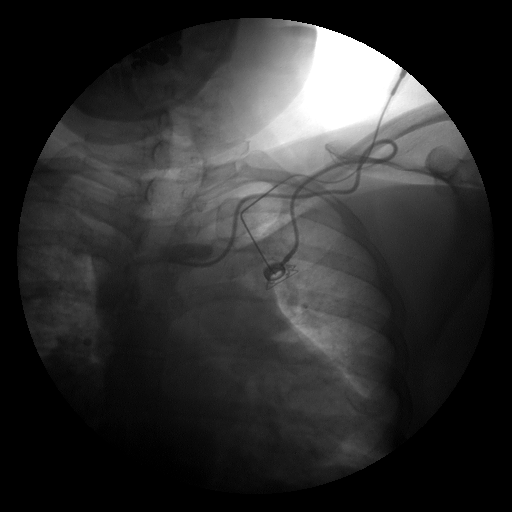
[im 12/15]
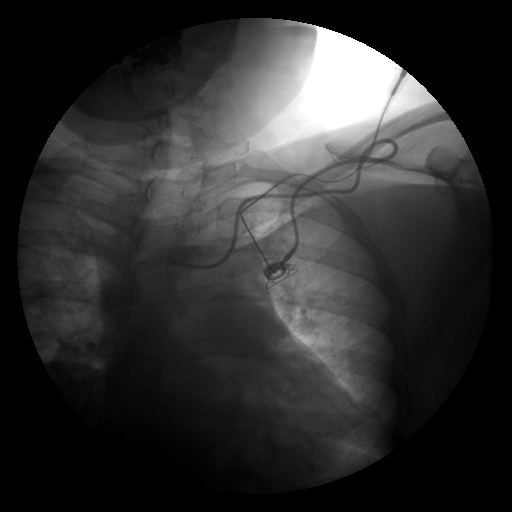
[im 15/15]
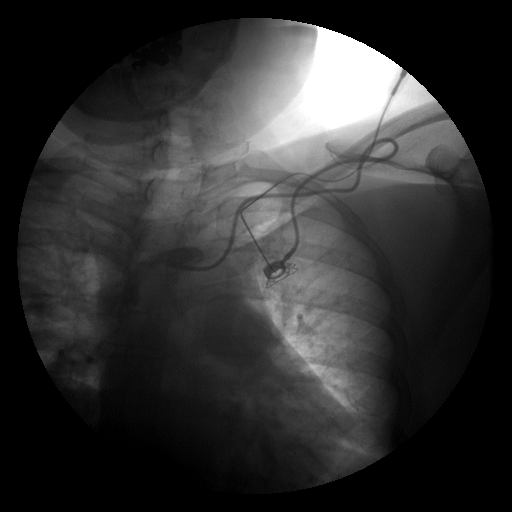

[Series 5: run · 5 of 11 slices shown (5 of 5)]
[im 1/11]
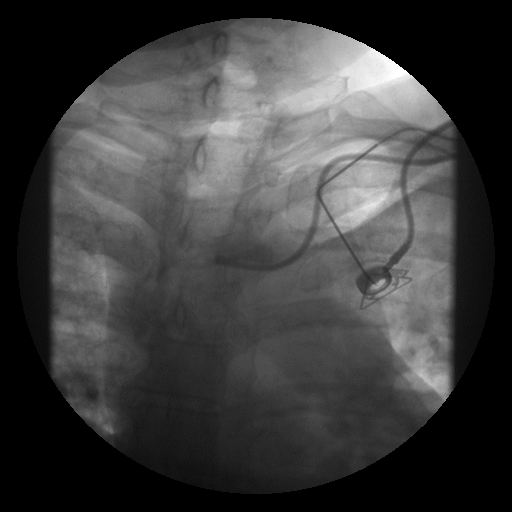
[im 5/11]
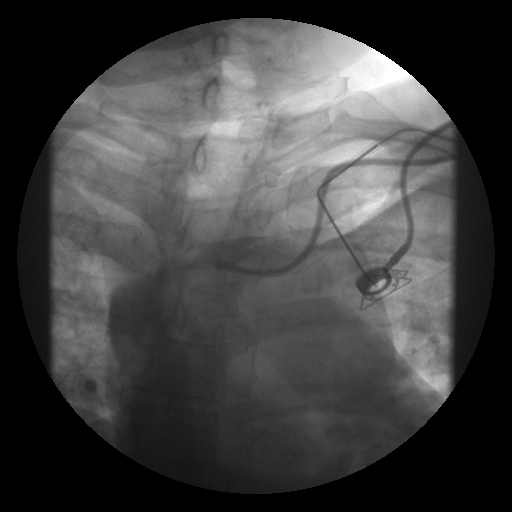
[im 7/11]
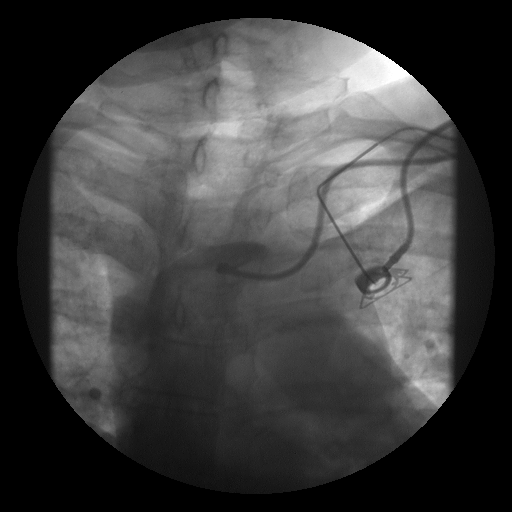
[im 9/11]
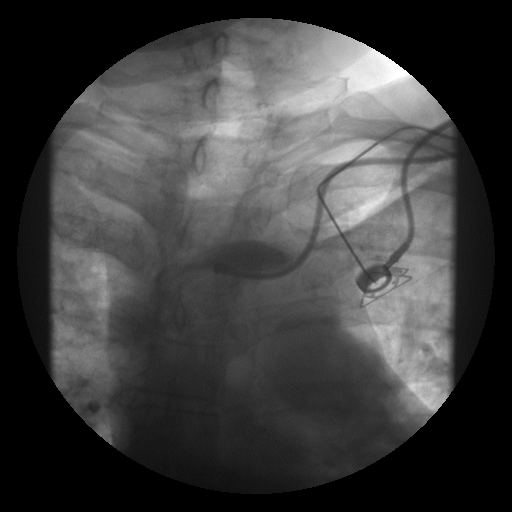
[im 11/11]
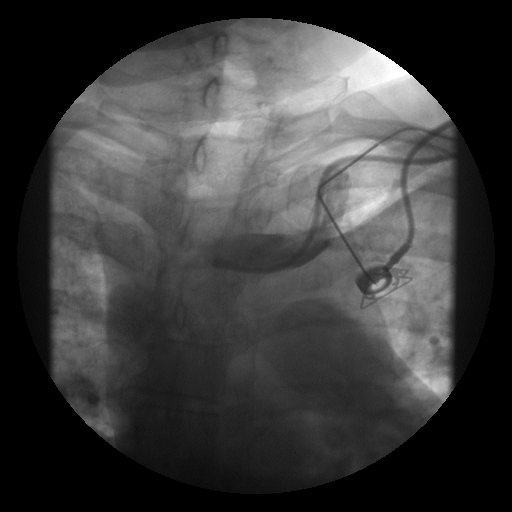

[14 of 16 positions shown; findings below may reference images not displayed]

However there is
correlation with a portable chest x-ray dated 05/11/2008, after
placement of the left Port-A-Cath.
FINDINGS: The Port-A-Cath is now malpositioned.  The tip, which was
formerly in the proximal SVC, is now in the left innominate vein.
The more proximal tubing has coiled in the subclavicular region.
Injection of contrast shows patency of the catheter without leak
from the catheter or port.  The contrast flows freely from the
catheter into a patent left innominate vein.  There is a small
amount of contrast collecting around the tip of the catheter
consistent with a small fibrin sheath.
IMPRESSION: 1.  The Port-A-Cath is now malpositioned, with the tip in the left
innominate vein, and the proximal tubing coiled in the
subclavicular space, peripheral to the venous insertion site.
2.  Venous structures patent.
3.  Small fibrin sheath around the tip of the catheter.

## 2009-01-19 ENCOUNTER — Ambulatory Visit: Payer: Self-pay | Admitting: Oncology

## 2009-01-21 LAB — COMPREHENSIVE METABOLIC PANEL
AST: 13 U/L (ref 0–37)
Albumin: 4.3 g/dL (ref 3.5–5.2)
BUN: 18 mg/dL (ref 6–23)
CO2: 23 mEq/L (ref 19–32)
Calcium: 9.8 mg/dL (ref 8.4–10.5)
Chloride: 103 mEq/L (ref 96–112)
Potassium: 3.8 mEq/L (ref 3.5–5.3)

## 2009-01-21 LAB — CBC WITH DIFFERENTIAL/PLATELET
BASO%: 0.6 % (ref 0.0–2.0)
Basophils Absolute: 0 10*3/uL (ref 0.0–0.1)
Eosinophils Absolute: 0.1 10*3/uL (ref 0.0–0.5)
HCT: 38.9 % (ref 34.8–46.6)
HGB: 13.2 g/dL (ref 11.6–15.9)
MCHC: 33.9 g/dL (ref 31.5–36.0)
MONO#: 0.5 10*3/uL (ref 0.1–0.9)
NEUT#: 4.4 10*3/uL (ref 1.5–6.5)
NEUT%: 66 % (ref 38.4–76.8)
WBC: 6.7 10*3/uL (ref 3.9–10.3)
lymph#: 1.7 10*3/uL (ref 0.9–3.3)

## 2009-02-10 ENCOUNTER — Encounter: Payer: Self-pay | Admitting: Oncology

## 2009-02-10 ENCOUNTER — Ambulatory Visit: Admission: RE | Admit: 2009-02-10 | Discharge: 2009-02-10 | Payer: Self-pay | Admitting: Oncology

## 2009-02-10 ENCOUNTER — Ambulatory Visit: Payer: Self-pay | Admitting: Internal Medicine

## 2009-02-11 LAB — COMPREHENSIVE METABOLIC PANEL
ALT: 16 U/L (ref 0–35)
AST: 14 U/L (ref 0–37)
Calcium: 9.7 mg/dL (ref 8.4–10.5)
Chloride: 102 mEq/L (ref 96–112)
Creatinine, Ser: 1 mg/dL (ref 0.40–1.20)
Potassium: 3.9 mEq/L (ref 3.5–5.3)
Sodium: 139 mEq/L (ref 135–145)
Total Protein: 7.2 g/dL (ref 6.0–8.3)

## 2009-02-11 LAB — CBC WITH DIFFERENTIAL/PLATELET
BASO%: 0.8 % (ref 0.0–2.0)
EOS%: 1.2 % (ref 0.0–7.0)
HCT: 39.9 % (ref 34.8–46.6)
LYMPH%: 22.2 % (ref 14.0–49.7)
MCH: 32.4 pg (ref 25.1–34.0)
MCHC: 34.3 g/dL (ref 31.5–36.0)
NEUT%: 69.2 % (ref 38.4–76.8)
Platelets: 226 10*3/uL (ref 145–400)
RBC: 4.23 10*6/uL (ref 3.70–5.45)
lymph#: 1.5 10*3/uL (ref 0.9–3.3)

## 2009-03-02 ENCOUNTER — Ambulatory Visit: Payer: Self-pay | Admitting: Oncology

## 2009-03-04 LAB — CBC WITH DIFFERENTIAL/PLATELET
Basophils Absolute: 0.1 10*3/uL (ref 0.0–0.1)
Eosinophils Absolute: 0.1 10*3/uL (ref 0.0–0.5)
HGB: 13.4 g/dL (ref 11.6–15.9)
MCV: 90.7 fL (ref 79.5–101.0)
MONO#: 0.5 10*3/uL (ref 0.1–0.9)
NEUT#: 4.6 10*3/uL (ref 1.5–6.5)
RDW: 12.5 % (ref 11.2–14.5)
WBC: 6.8 10*3/uL (ref 3.9–10.3)
lymph#: 1.6 10*3/uL (ref 0.9–3.3)

## 2009-03-04 LAB — COMPREHENSIVE METABOLIC PANEL
ALT: 15 U/L (ref 0–35)
Albumin: 4.2 g/dL (ref 3.5–5.2)
CO2: 21 mEq/L (ref 19–32)
Chloride: 104 mEq/L (ref 96–112)
Glucose, Bld: 198 mg/dL — ABNORMAL HIGH (ref 70–99)
Potassium: 3.8 mEq/L (ref 3.5–5.3)
Sodium: 137 mEq/L (ref 135–145)
Total Protein: 6.8 g/dL (ref 6.0–8.3)

## 2009-03-15 ENCOUNTER — Encounter: Admission: RE | Admit: 2009-03-15 | Discharge: 2009-03-15 | Payer: Self-pay | Admitting: Oncology

## 2009-03-25 LAB — CBC WITH DIFFERENTIAL/PLATELET
BASO%: 0.5 % (ref 0.0–2.0)
Eosinophils Absolute: 0.1 10*3/uL (ref 0.0–0.5)
MCHC: 33.7 g/dL (ref 31.5–36.0)
MCV: 91 fL (ref 79.5–101.0)
MONO#: 0.4 10*3/uL (ref 0.1–0.9)
MONO%: 5.6 % (ref 0.0–14.0)
NEUT#: 4.4 10*3/uL (ref 1.5–6.5)
RBC: 4.31 10*6/uL (ref 3.70–5.45)
RDW: 12.6 % (ref 11.2–14.5)
WBC: 6.3 10*3/uL (ref 3.9–10.3)

## 2009-03-25 LAB — COMPREHENSIVE METABOLIC PANEL
AST: 13 U/L (ref 0–37)
Albumin: 4.2 g/dL (ref 3.5–5.2)
BUN: 19 mg/dL (ref 6–23)
CO2: 23 mEq/L (ref 19–32)
Calcium: 9.6 mg/dL (ref 8.4–10.5)
Chloride: 106 mEq/L (ref 96–112)
Creatinine, Ser: 0.95 mg/dL (ref 0.40–1.20)
Glucose, Bld: 144 mg/dL — ABNORMAL HIGH (ref 70–99)
Potassium: 3.8 mEq/L (ref 3.5–5.3)

## 2009-04-13 ENCOUNTER — Ambulatory Visit: Payer: Self-pay | Admitting: Oncology

## 2009-04-15 LAB — COMPREHENSIVE METABOLIC PANEL
AST: 12 U/L (ref 0–37)
BUN: 18 mg/dL (ref 6–23)
Calcium: 9.4 mg/dL (ref 8.4–10.5)
Chloride: 105 mEq/L (ref 96–112)
Creatinine, Ser: 0.89 mg/dL (ref 0.40–1.20)

## 2009-04-15 LAB — CBC WITH DIFFERENTIAL/PLATELET
Basophils Absolute: 0.1 10*3/uL (ref 0.0–0.1)
EOS%: 1 % (ref 0.0–7.0)
HCT: 38.8 % (ref 34.8–46.6)
HGB: 13.1 g/dL (ref 11.6–15.9)
MCH: 30.8 pg (ref 25.1–34.0)
MCV: 91.1 fL (ref 79.5–101.0)
NEUT%: 66.4 % (ref 38.4–76.8)
lymph#: 1.7 10*3/uL (ref 0.9–3.3)

## 2009-04-15 LAB — CANCER ANTIGEN 27.29: CA 27.29: 23 U/mL (ref 0–39)

## 2009-05-06 LAB — CBC WITH DIFFERENTIAL/PLATELET
Basophils Absolute: 0 10*3/uL (ref 0.0–0.1)
Eosinophils Absolute: 0.1 10*3/uL (ref 0.0–0.5)
HGB: 13.2 g/dL (ref 11.6–15.9)
MONO#: 0.4 10*3/uL (ref 0.1–0.9)
NEUT#: 4.5 10*3/uL (ref 1.5–6.5)
RBC: 4.3 10*6/uL (ref 3.70–5.45)
RDW: 12.7 % (ref 11.2–14.5)
WBC: 6.6 10*3/uL (ref 3.9–10.3)

## 2009-05-06 LAB — COMPREHENSIVE METABOLIC PANEL
Albumin: 3.6 g/dL (ref 3.5–5.2)
Alkaline Phosphatase: 84 U/L (ref 39–117)
BUN: 15 mg/dL (ref 6–23)
CO2: 27 mEq/L (ref 19–32)
Glucose, Bld: 142 mg/dL — ABNORMAL HIGH (ref 70–99)
Sodium: 139 mEq/L (ref 135–145)
Total Bilirubin: 0.3 mg/dL (ref 0.3–1.2)
Total Protein: 6.6 g/dL (ref 6.0–8.3)

## 2009-05-17 ENCOUNTER — Encounter: Payer: Self-pay | Admitting: Oncology

## 2009-05-17 ENCOUNTER — Ambulatory Visit: Admission: RE | Admit: 2009-05-17 | Discharge: 2009-05-17 | Payer: Self-pay | Admitting: Oncology

## 2009-05-25 ENCOUNTER — Ambulatory Visit: Payer: Self-pay | Admitting: Oncology

## 2009-05-27 LAB — CBC WITH DIFFERENTIAL/PLATELET
BASO%: 0.9 % (ref 0.0–2.0)
LYMPH%: 23.1 % (ref 14.0–49.7)
MCHC: 33.8 g/dL (ref 31.5–36.0)
MONO#: 0.5 10*3/uL (ref 0.1–0.9)
Platelets: 199 10*3/uL (ref 145–400)
RBC: 4.19 10*6/uL (ref 3.70–5.45)
RDW: 12.6 % (ref 11.2–14.5)
WBC: 6.6 10*3/uL (ref 3.9–10.3)
lymph#: 1.5 10*3/uL (ref 0.9–3.3)
nRBC: 0 % (ref 0–0)

## 2009-05-27 LAB — COMPREHENSIVE METABOLIC PANEL
Albumin: 4.3 g/dL (ref 3.5–5.2)
BUN: 20 mg/dL (ref 6–23)
CO2: 24 mEq/L (ref 19–32)
Calcium: 9.4 mg/dL (ref 8.4–10.5)
Chloride: 104 mEq/L (ref 96–112)
Glucose, Bld: 151 mg/dL — ABNORMAL HIGH (ref 70–99)
Potassium: 3.7 mEq/L (ref 3.5–5.3)
Sodium: 138 mEq/L (ref 135–145)
Total Protein: 6.4 g/dL (ref 6.0–8.3)

## 2009-05-27 LAB — CANCER ANTIGEN 27.29: CA 27.29: 23 U/mL (ref 0–39)

## 2009-06-25 ENCOUNTER — Ambulatory Visit (HOSPITAL_BASED_OUTPATIENT_CLINIC_OR_DEPARTMENT_OTHER): Admission: RE | Admit: 2009-06-25 | Discharge: 2009-06-25 | Payer: Self-pay | Admitting: Surgery

## 2009-08-07 IMAGING — CR DG HAND COMPLETE 3+V*R*
3 series · 3 of 3 positions shown · non-contrast
Comparison: None

CLINICAL DATA: Fell and injured right hand.

RIGHT HAND - COMPLETE 3+ VIEW

[x hand ap right]
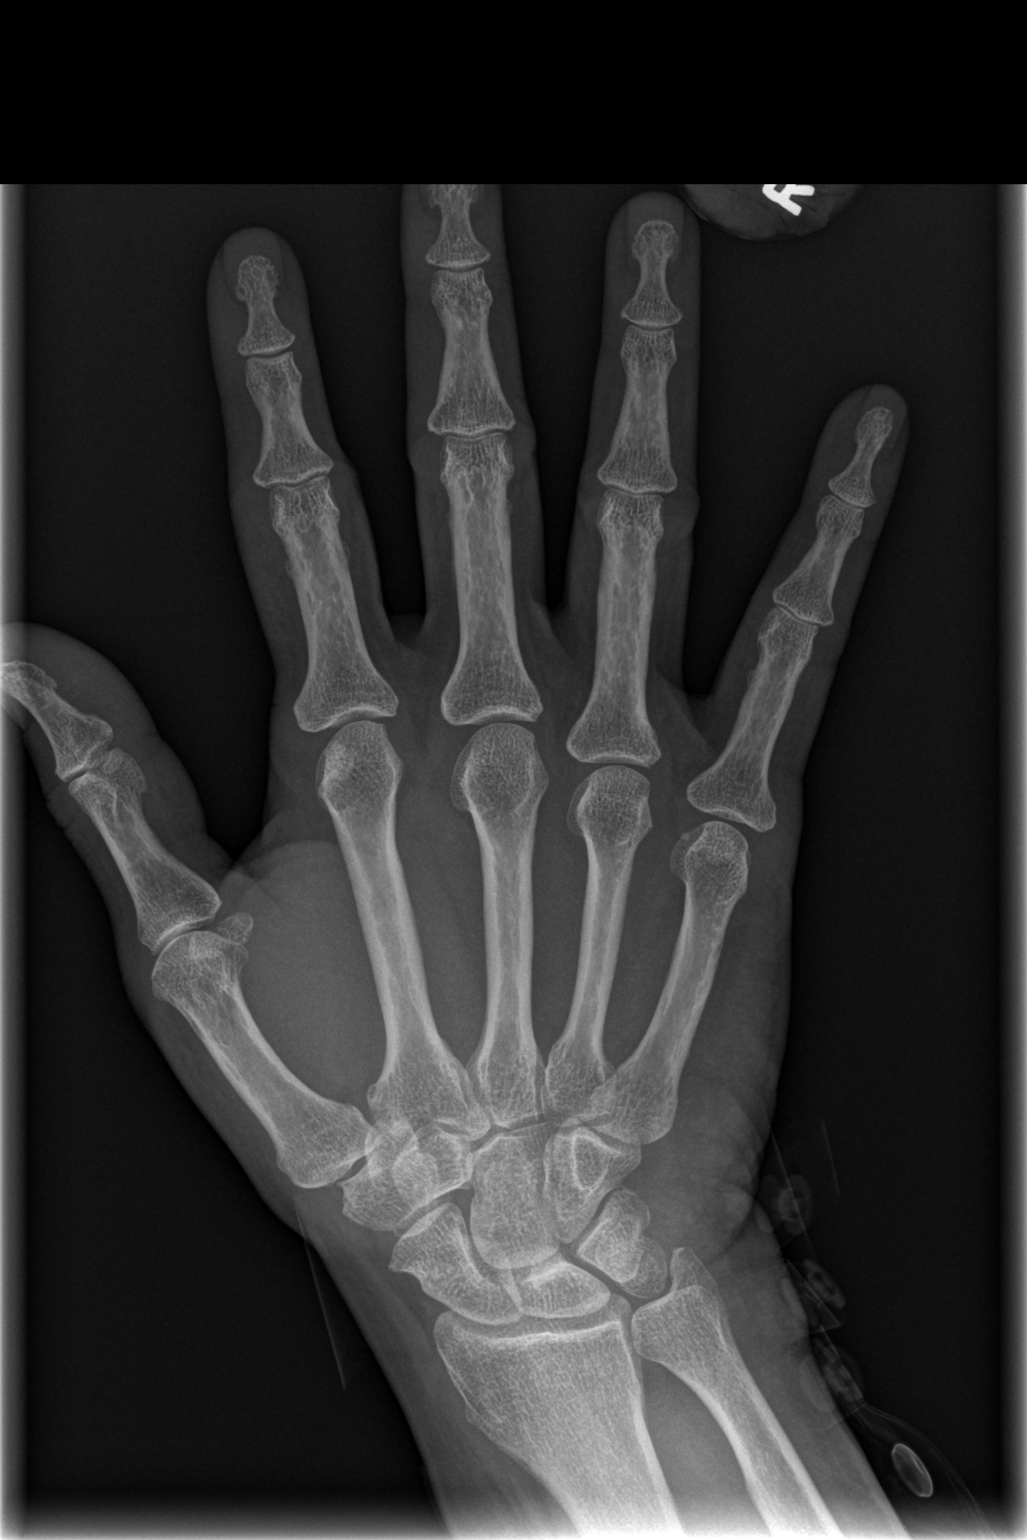

[x hand oblique right]
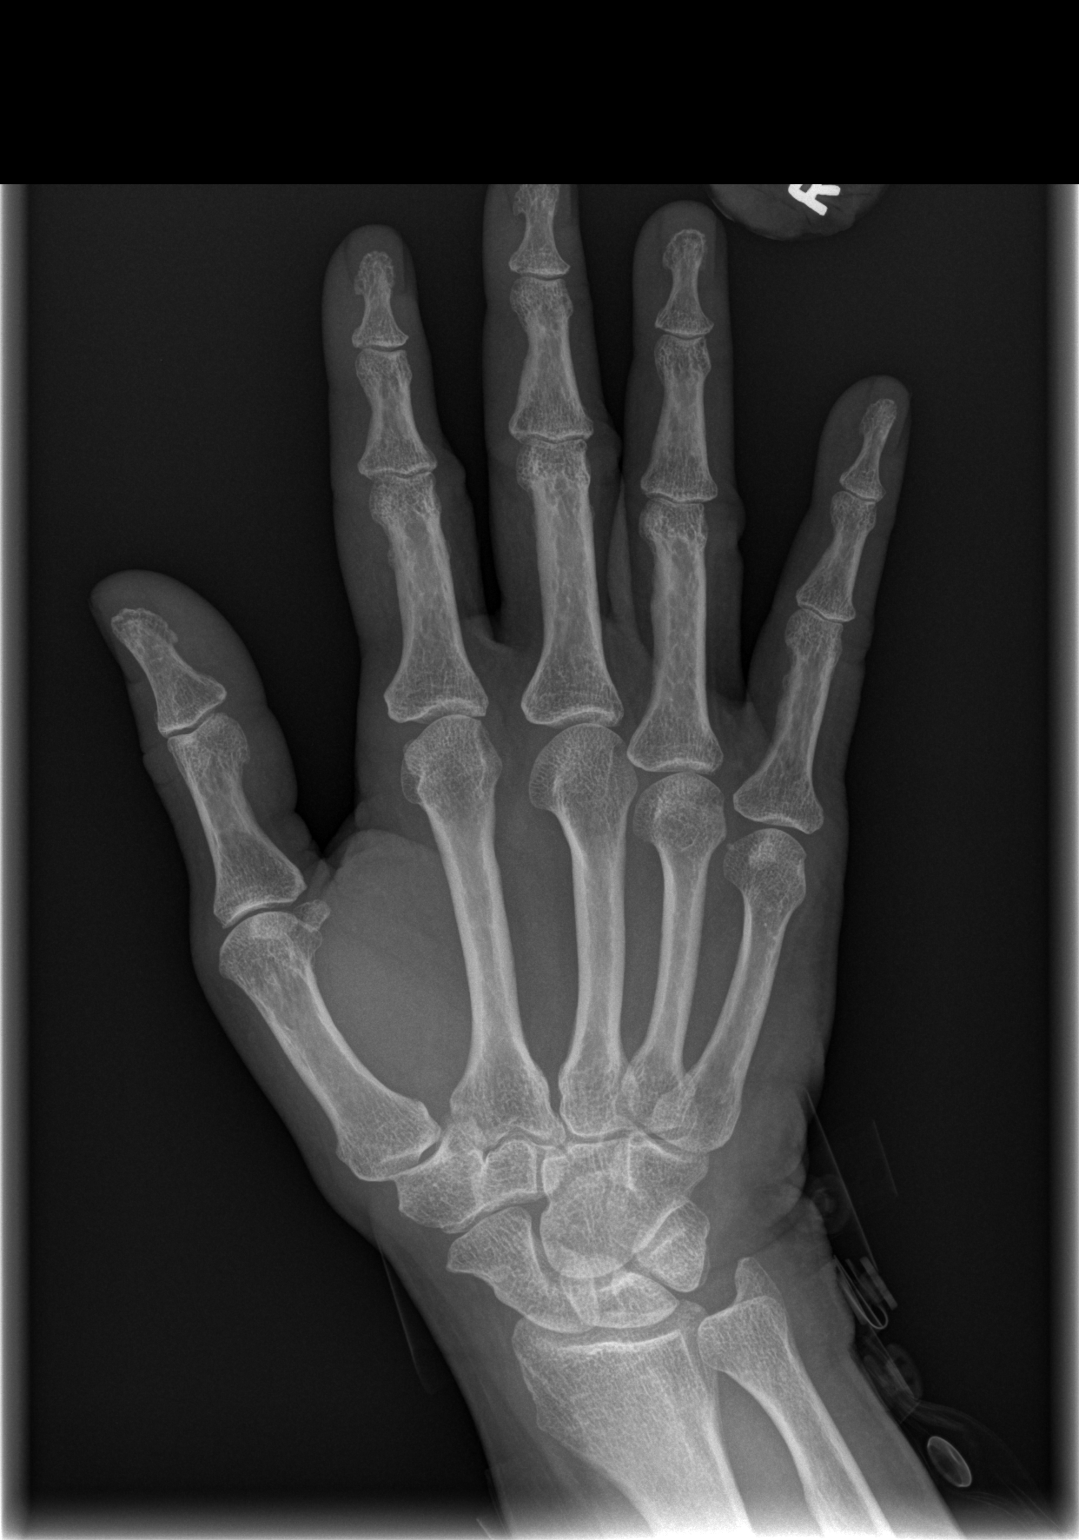

[x hand lat right]
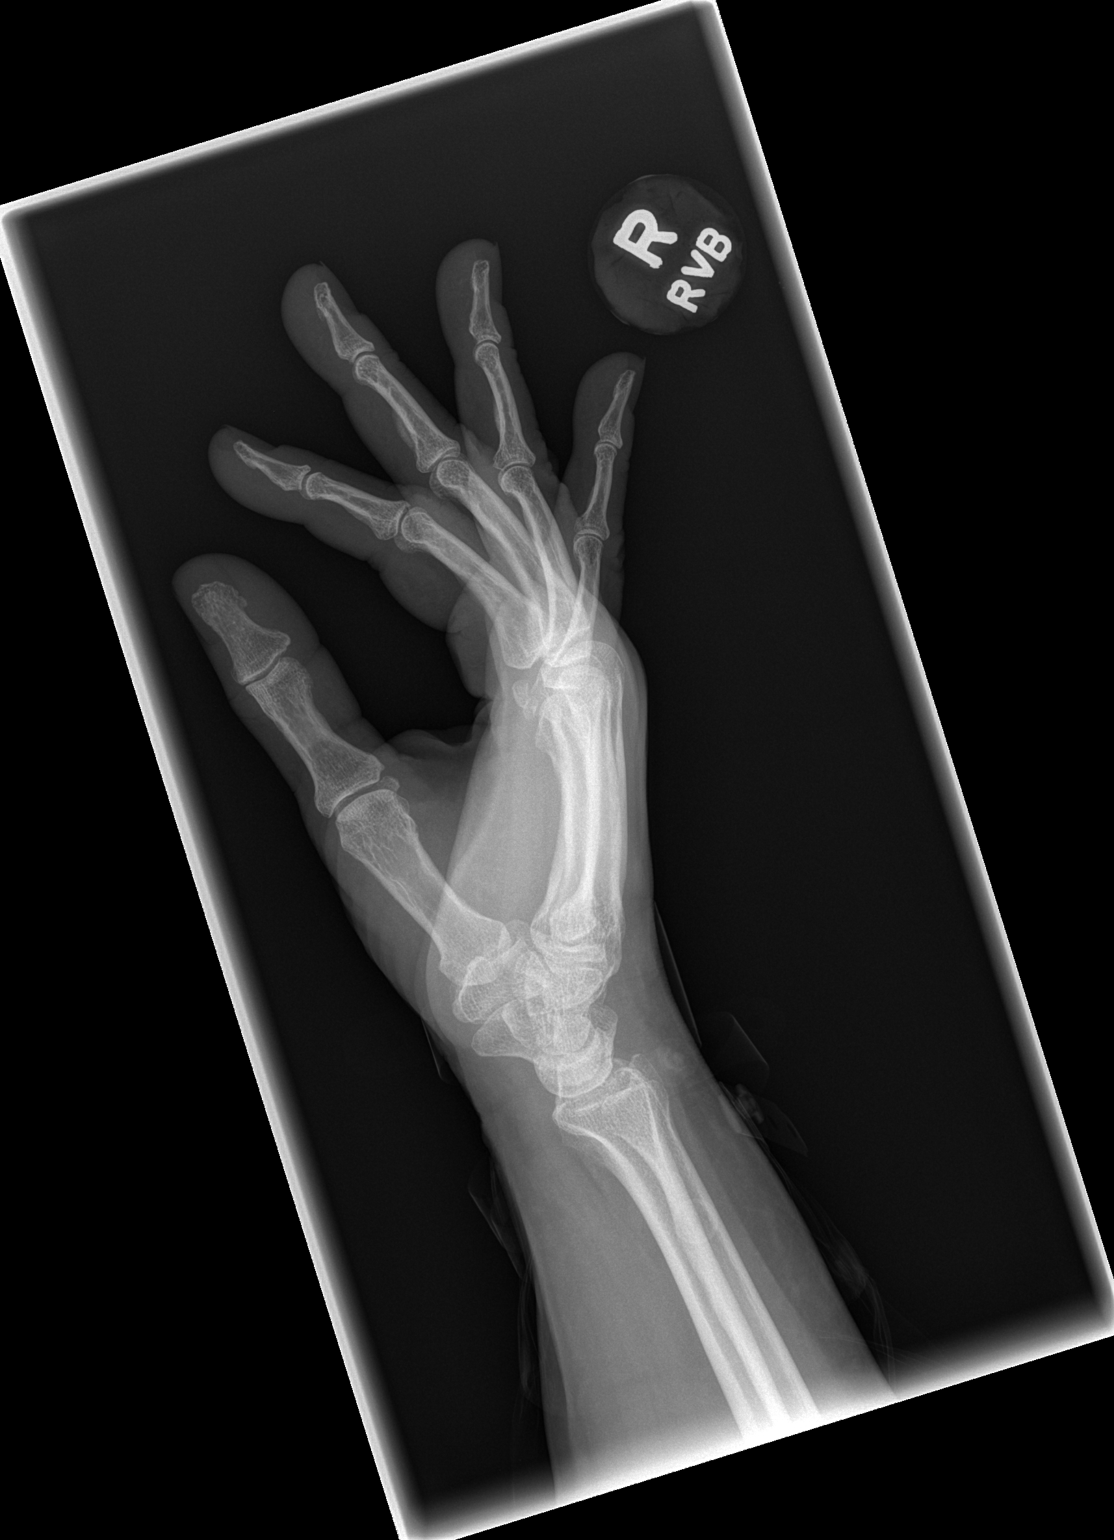

[3 of 3 positions shown; findings below may reference images not displayed]

FINDINGS: There are mild DIP and PIP joint degenerative changes.
No acute bony findings.
IMPRESSION: No acute bony findings.

## 2009-08-12 ENCOUNTER — Ambulatory Visit (HOSPITAL_BASED_OUTPATIENT_CLINIC_OR_DEPARTMENT_OTHER): Admission: RE | Admit: 2009-08-12 | Discharge: 2009-08-12 | Payer: Self-pay | Admitting: Orthopedic Surgery

## 2009-10-05 ENCOUNTER — Ambulatory Visit: Payer: Self-pay | Admitting: Oncology

## 2009-10-08 LAB — CBC WITH DIFFERENTIAL/PLATELET
BASO%: 1.2 % (ref 0.0–2.0)
EOS%: 1.5 % (ref 0.0–7.0)
HCT: 40.7 % (ref 34.8–46.6)
LYMPH%: 26.7 % (ref 14.0–49.7)
MCH: 32.2 pg (ref 25.1–34.0)
MCHC: 33.6 g/dL (ref 31.5–36.0)
MCV: 95.7 fL (ref 79.5–101.0)
MONO#: 0.3 10*3/uL (ref 0.1–0.9)
MONO%: 5.9 % (ref 0.0–14.0)
NEUT%: 64.7 % (ref 38.4–76.8)
Platelets: 225 10*3/uL (ref 145–400)

## 2009-10-08 LAB — COMPREHENSIVE METABOLIC PANEL
ALT: 18 U/L (ref 0–35)
CO2: 24 mEq/L (ref 19–32)
Creatinine, Ser: 0.86 mg/dL (ref 0.40–1.20)
Total Bilirubin: 0.4 mg/dL (ref 0.3–1.2)

## 2009-10-08 LAB — LACTATE DEHYDROGENASE: LDH: 111 U/L (ref 94–250)

## 2009-10-08 LAB — CANCER ANTIGEN 27.29: CA 27.29: 16 U/mL (ref 0–39)

## 2009-11-17 ENCOUNTER — Encounter: Admission: RE | Admit: 2009-11-17 | Discharge: 2009-12-22 | Payer: Self-pay | Admitting: Orthopedic Surgery

## 2010-03-02 ENCOUNTER — Ambulatory Visit: Payer: Self-pay | Admitting: Oncology

## 2010-03-02 LAB — COMPREHENSIVE METABOLIC PANEL
ALT: 15 U/L (ref 0–35)
AST: 15 U/L (ref 0–37)
Albumin: 4.6 g/dL (ref 3.5–5.2)
Alkaline Phosphatase: 89 U/L (ref 39–117)
Chloride: 102 mEq/L (ref 96–112)
Potassium: 3.9 mEq/L (ref 3.5–5.3)
Sodium: 138 mEq/L (ref 135–145)
Total Protein: 7.3 g/dL (ref 6.0–8.3)

## 2010-03-02 LAB — CBC WITH DIFFERENTIAL/PLATELET
EOS%: 1.1 % (ref 0.0–7.0)
MCH: 31.6 pg (ref 25.1–34.0)
MCV: 95.1 fL (ref 79.5–101.0)
MONO%: 6.7 % (ref 0.0–14.0)
NEUT#: 4.8 10*3/uL (ref 1.5–6.5)
RBC: 4.56 10*6/uL (ref 3.70–5.45)
RDW: 12.1 % (ref 11.2–14.5)
lymph#: 2.1 10*3/uL (ref 0.9–3.3)

## 2010-03-03 ENCOUNTER — Ambulatory Visit (HOSPITAL_COMMUNITY): Admission: RE | Admit: 2010-03-03 | Discharge: 2010-03-03 | Payer: Self-pay | Admitting: Internal Medicine

## 2010-03-22 ENCOUNTER — Encounter: Admission: RE | Admit: 2010-03-22 | Discharge: 2010-03-22 | Payer: Self-pay | Admitting: Oncology

## 2010-03-31 LAB — CBC WITH DIFFERENTIAL/PLATELET
Basophils Absolute: 0 10*3/uL (ref 0.0–0.1)
Eosinophils Absolute: 0.1 10*3/uL (ref 0.0–0.5)
HCT: 41.3 % (ref 34.8–46.6)
HGB: 14 g/dL (ref 11.6–15.9)
LYMPH%: 27.3 % (ref 14.0–49.7)
MCV: 93.7 fL (ref 79.5–101.0)
MONO#: 0.5 10*3/uL (ref 0.1–0.9)
MONO%: 7 % (ref 0.0–14.0)
NEUT#: 4.8 10*3/uL (ref 1.5–6.5)
NEUT%: 64.2 % (ref 38.4–76.8)
Platelets: 255 10*3/uL (ref 145–400)
RBC: 4.41 10*6/uL (ref 3.70–5.45)
WBC: 7.5 10*3/uL (ref 3.9–10.3)

## 2010-04-01 LAB — COMPREHENSIVE METABOLIC PANEL
Albumin: 4.4 g/dL (ref 3.5–5.2)
Alkaline Phosphatase: 95 U/L (ref 39–117)
BUN: 13 mg/dL (ref 6–23)
CO2: 22 mEq/L (ref 19–32)
Glucose, Bld: 175 mg/dL — ABNORMAL HIGH (ref 70–99)
Sodium: 138 mEq/L (ref 135–145)
Total Bilirubin: 0.3 mg/dL (ref 0.3–1.2)
Total Protein: 7.1 g/dL (ref 6.0–8.3)

## 2010-04-01 LAB — CANCER ANTIGEN 27.29: CA 27.29: 15 U/mL (ref 0–39)

## 2010-04-01 LAB — LACTATE DEHYDROGENASE: LDH: 109 U/L (ref 94–250)

## 2010-04-01 LAB — VITAMIN D 25 HYDROXY (VIT D DEFICIENCY, FRACTURES): Vit D, 25-Hydroxy: 29 ng/mL — ABNORMAL LOW (ref 30–89)

## 2010-04-04 ENCOUNTER — Ambulatory Visit (HOSPITAL_BASED_OUTPATIENT_CLINIC_OR_DEPARTMENT_OTHER): Payer: BC Managed Care – PPO | Admitting: Oncology

## 2010-04-12 ENCOUNTER — Ambulatory Visit (HOSPITAL_BASED_OUTPATIENT_CLINIC_OR_DEPARTMENT_OTHER): Admission: RE | Admit: 2010-04-12 | Discharge: 2010-04-12 | Payer: Self-pay | Admitting: Orthopedic Surgery

## 2010-09-17 ENCOUNTER — Encounter: Payer: Self-pay | Admitting: Gynecology

## 2010-09-18 ENCOUNTER — Encounter: Payer: Self-pay | Admitting: Gynecology

## 2010-09-18 ENCOUNTER — Encounter: Payer: Self-pay | Admitting: Oncology

## 2010-11-11 LAB — BASIC METABOLIC PANEL
CO2: 25 mEq/L (ref 19–32)
Calcium: 9.7 mg/dL (ref 8.4–10.5)
GFR calc Af Amer: >60 mL/min (ref >60)
GFR calc non Af Amer: >60 mL/min (ref >60)
Sodium: 139 mEq/L (ref 135–145)

## 2010-11-21 ENCOUNTER — Encounter (HOSPITAL_BASED_OUTPATIENT_CLINIC_OR_DEPARTMENT_OTHER): Payer: BC Managed Care – PPO | Admitting: Oncology

## 2010-11-21 DIAGNOSIS — Z170 Estrogen receptor positive status [ER+]: Secondary | ICD-10-CM

## 2010-11-21 DIAGNOSIS — C50119 Malignant neoplasm of central portion of unspecified female breast: Secondary | ICD-10-CM

## 2010-11-21 LAB — CBC WITH DIFFERENTIAL/PLATELET
Eosinophils Absolute: 0.1 10*3/uL (ref 0.0–0.5)
MONO#: 0.3 10*3/uL (ref 0.1–0.9)
NEUT#: 4.5 10*3/uL (ref 1.5–6.5)
RBC: 4.45 10*6/uL (ref 3.70–5.45)
RDW: 12.4 % (ref 11.2–14.5)
WBC: 6.7 10*3/uL (ref 3.9–10.3)

## 2010-11-21 LAB — COMPREHENSIVE METABOLIC PANEL
ALT: 14 U/L (ref 0–35)
CO2: 26 mEq/L (ref 19–32)
Calcium: 9.9 mg/dL (ref 8.4–10.5)
Chloride: 103 mEq/L (ref 96–112)
Sodium: 139 mEq/L (ref 135–145)
Total Protein: 7.1 g/dL (ref 6.0–8.3)

## 2010-11-21 LAB — CANCER ANTIGEN 27.29: CA 27.29: 27 U/mL (ref 0–39)

## 2010-11-21 LAB — LACTATE DEHYDROGENASE: LDH: 112 U/L (ref 94–250)

## 2010-11-29 LAB — BASIC METABOLIC PANEL
CO2: 28 mEq/L (ref 19–32)
Calcium: 9.5 mg/dL (ref 8.4–10.5)
Creatinine, Ser: 0.8 mg/dL (ref 0.4–1.2)
Glucose, Bld: 213 mg/dL — ABNORMAL HIGH (ref 70–99)
Sodium: 140 mEq/L (ref 135–145)

## 2011-01-10 NOTE — Op Note (Signed)
Shelly Simpson, Shelly Simpson               ACCOUNT NO.:  [id number]   MEDICAL RECORD NO.:  [id number]          PATIENT TYPE:  AMB   LOCATION:  DSC                          FACILITY:  [facility]   PHYSICIAN:  Currie Paris, M.D.DATE OF BIRTH:  [birth year]   DATE OF PROCEDURE:  04/08/2008  DATE OF DISCHARGE:                               OPERATIVE REPORT   PREOPERATIVE DIAGNOSIS:  Carcinoma, right breast, centrally located,  clinical stage I.   POSTOPERATIVE DIAGNOSIS:  Carcinoma, right breast, centrally located,  clinical stage I.   OPERATION:  Needle-guided excision of right breast cancer with blue-dye  injection and axillary sentinel lymph node biopsy.   SURGEON:  Currie Paris, MD   ANESTHESIA:  General.   CLINICAL HISTORY:  This is a 61-year lady recently presenting with a  mammographically located breast cancer, which appears to be subareolarly  and about the 6 o'clock position.  I thought that it was too close to  the nipple-areolar complex, it was successfully removed with a good  superficial margin without taking off the nipple-areolar complex.  We  discussed mastectomies, its alternatives and we also talked about  sentinel nodes as staging procedures.  After all questions were  answered, she elected to proceed to a needle-guided wide local excision  to include the nipple-areolar complex and sentinel lymph node  evaluation.   DESCRIPTION OF PROCEDURE:  The patient was seen in the holding area and  she had no further questions.  I initialed the right breast as the  operative side as did the patient.  Her guidewire had already been  placed and I reviewed those films.  She was injected with a radioactive  isotope prior to my seeing her.   The patient was taken to the operating room.  After satisfactory general  (LMA) anesthesia had been obtained, the time-out was done.  The right  breast was then injected with dilute methylene blue subareolarly and  massaged in.   Entire breast and axillary area were prepped and draped.  Using  NeoProbe, I found a hot area in the axilla made an incision directly  over that.  As I divided the subcutaneous tissue, I found a pink about  1.2-cm lymph node that seem to have counts in it and I excised that with  a cautery.  It had counts about 150, but no blue dye.  Another hotter  area seemed to be some somewhat deeper, so I divided more of the fatty  tissue, found a blue lymphatic and traced it in and found a blue lymph  node that was grasped and then excised.  Ex vivo had had counts of about  600.   With this out of the axilla, there were was no other palpable  adenopathy, no blue dye noted and all counts dropped down to a  background level of 0-10.  While waiting for pathology, I placed a moist  pack.  Once the pathology report had come back, I removed that,  irrigated, and closed in layers with 3-0 Vicryl and Monocryl  subcuticular.   While waiting for path, I turned my  attention back to the breast.  She  had the guidewire entering laterally and tracking medially, and it  appeared that the lesion was going to be directly under the areolar  margin at the 6 o'clock position.  I made an incision transversely to  take out the nipple-areolar complex, but very little other skin except  extending a little bit laterally and medially.  I divided subcutaneous  and breast tissue down along the superior edge going straight down since  I thought I would have a large margin superiorly, then I went around  medially and then came inferiorly and under it.  Again, this did not  appear very deep and she has somewhat large breasts, so I did not go all  the way to the chest wall for the deep margin, but could identify the  tip of the guidewire medially and I came well under that.  Finally, when  I was surrounded and had it freed up on the superior, medial, inferior,  and deep, I was able to put traction on it and divide the final  lateral  attachments.  I oriented the specimen for the pathologist.   I could palpate the tumor and I saw that I had good fatty tissue moving  all directions, but it appeared to be closest to the inferior margin, so  I went back and right at that area took another centimeter thick piece  of tissue for an additional inferior margin and sent that separately.  The specimen mammogram showed the clip to be in the middle of the  specimen.   I spent several minutes irrigating and making sure everything was dry.  I used several suture ligatures on some of the larger vessels and then  closed the breast with interrupted 2-0 Vicryl, 3-0 Vicryl, and 4-0  Monocryl subcuticular plus Dermabond.   Pathology did report the nodes were negative.  Dr. Deboraha Sprang thought that  the clip was well in the middle of the specimen.  This concluded the  procedure.  She has tolerated it well and there were no operative  complications.  All counts were correct.      Currie Paris, M.D.  Electronically Signed     CJS/MEDQ  D:  04/08/2008  T:  04/09/2008  Job:  [id number]   cc:   Margaretmary Bayley, M.D.  Gretta Cool, M.D.

## 2011-01-10 NOTE — Op Note (Signed)
Shelly Simpson, DORIN               ACCOUNT NO.:  [id number]   MEDICAL RECORD NO.:  [id number]          PATIENT TYPE:  AMB   LOCATION:  DAY                          FACILITY:  [facility]   PHYSICIAN:  Anselm Pancoast. Weatherly, M.D.DATE OF BIRTH:  [birth year]   DATE OF PROCEDURE:  06/01/2008  DATE OF DISCHARGE:                               OPERATIVE REPORT   PREOPERATIVE DIAGNOSIS:  Malpositioned Port-A-Cath, history of breast  cancer.   OPERATION:  A replacement Port-A-Cath, left subclavian area.  General  anesthesia, LOA.   SURGEON:  Dr. Consuello Bossier.   HISTORY:  Shelly Simpson is a 65 year old moderately overweight female  who had a recent lumpectomy and has received one dose of chemotherapy  through a Port-A-Cath that Dr. Jamey Ripa placed.  This was placed in the  subclavian area.  He placed it up kind of medial above her left breast  and recently it has been noted that the end of the catheter has migrated  on up near about left subclavian area and Dr. Tenna Child surgical time  does not permit time for him to reposition the catheter at this time.  He asked me on Friday since it looked as if I had a block could I do  this and I saw the patient for the first time this morning.  I am not  sure exactly why the catheter has tended to migrate back and I suggest  with her that we will kind of  open the area, try to figure out exactly  why and then most likely replace the catheter and use a little longer  intrathoracic portion of the catheter.   DESCRIPTION OF PROCEDURE:  This was done.  We put her to sleep with LOA  tube.  She had received a gram of Ancef and then the left chest was  prepped with Hibiclens.  She is supposedly allergic to shellfish and  then draped in sterile manner.  We brought the C-arm in and it looks as  if there is about extra kind of like coil outside of the subclavian soft  of in the left subclavian subcutaneous area.  I first opened up the  reservoir or the little  incision where the reservoir was placed.  It  gets down kind of in the top part of the breast medially and at the  completion, I placed the catheter a little bit more higher so that the  movement her breast hopefully is not going to manipulate the reservoir  as much as where it is presently positioned.  We then freed up the  reservoir, cut in the little Prolene sutures that had anchored it and  then opened the little incision where it had been inserted so I could  detached the catheter, bring out the catheter.  You could not slide the  catheter back in so I put a guidewire in it.  The guide were slipped on  down into the atrium easily and then pulled this portion of catheter  over the guide wire, put introducer and replaced a new using the Power  [place] is what he has used.  This catheter was a little bit stiffer than  some and that might be a part of an option but since he had used that  and this introducer, etc. I replaced since I had not had this problem  previously.  I then slipped the catheter on down far, switched it back  kind of through the little subcutaneous to where it would hit to the  reservoir and then tried to pull the catheter up so that it is really  just in the atrium.  With this position, we then kind of manipulated the  catheter and noted that it looks like it is definitely migrating up  again and this is without using of the movement of the arm.  I then  slipped it down again putting in the guidewire, slipping it down and  then tried to put the catheter in the subcutaneous portion a little  deeper so it was not rising up into the immediate subcutaneous area so  it is kind of fixed a little bit to the deeper subcutaneous tissue in  the subclavian area.  I then readjusted it.  This time it looks as if it  is actually in the atrium probably about an inch but I think that since  it is going to most likely back up a little bit that should be adequate  position.  I then closed  the little subcutaneous tissue in the  subclavian area, that kind of helped position it and then cut off the  catheter, hooked it up to the reservoir and then the actual little  subcutaneous pocket is more over the midline area so that it is not  really in the breast.  It was anchored to the fascia again with the 3  sutures of 2-0 Prolene and the reservoir had been previously filled with  heparinized saline.  I then closed the subcutaneous tissue around the  catheter with 3-0 Vicryl, used a 4-0 Monocryl on the subcuticular and  then put a little pressure dressing on the area after placing Steri-  Strips and Benzoin on the skin.  I put a little circle where the area  can be accessed.  I accessed it easily with a straight Huber needle.  It  withdraws blood easily and then filled with 3 cc of 100 units of heparin  per cc of the heparinized higher concentrated flush.  The compressive  dressing of 4x4s were then placed, held in place with Hypafix.  The  patient has a treatment scheduled for Thursday and we will let them  access.  I think they can go without removing the Steri-Strips and keep  a compressive dressing back on it for about a week and also advised her  not to be using the arm very vigorously during this first few days since  I think that somehow or another the movement of the arm and etc. is  tending to back the catheter out somehow.           ______________________________  Anselm Pancoast. Zachery Dakins, M.D.     WJW/MEDQ  D:  06/01/2008  T:  06/01/2008  Job:  [id number]   cc:   Pierce Crane, MD  Fax: [phone]

## 2011-01-10 NOTE — Op Note (Signed)
Shelly Simpson, LEVETT               ACCOUNT NO.:  [id number]   MEDICAL RECORD NO.:  [id number]          PATIENT TYPE:  AMB   LOCATION:  SDS                          FACILITY:  [facility]   PHYSICIAN:  Currie Paris, M.D.DATE OF BIRTH:  03-09-1946   DATE OF PROCEDURE:  05/11/2008  DATE OF DISCHARGE:  05/11/2008                               OPERATIVE REPORT   PREOPERATIVE DIAGNOSIS:  Carcinoma, right breast.   POSTOPERATIVE DIAGNOSIS:  Carcinoma, right breast.   OPERATION:  Port-a-Cath placement (power port).   SURGEON:  Currie Paris, MD   ANESTHESIA:  MAC.   CLINICAL HISTORY:  This patient is a 65 year old lady getting ready to  begin her chemotherapy and needs IV access.   DESCRIPTION OF PROCEDURE:  The patient was seen in the holding area and  she had no further questions.  We confirmed Port-a-Cath placement as the  planned procedure.   She was taken to the operating room and given IV sedation.  She was  placed in Trendelenburg position and the upper chest and lower neck were  prepped and draped as a sterile field.  The time out was done.   The left subclavian vein was entered on the second attempt and had good  backflow.  The guide wire was threaded and then using fluoro, I advanced  into the superior vena cava.   Additional local was infiltrated onto the anterior chest wall and a  transverse incision was made and a subcutaneous pocket fashioned for the  reservoir.  The Port-a-Cath tubing was pullled through a subcutaneous  tunnel from the reservoir spot to the guide wire spot.   Guidewire tract was dilated once with dilator peel-away sheath, and the  dilator and guidewire were removed.  The catheter was threaded to 25 cm  easily and the peel-away sheath removed.   Using fluoro, I backed this up to where I thought I was in the mid to  distal SVC.  It was aspirated and flushed easily.   The reservoir was flushed, attached, and locking mechanism engaged.  This  was aspirated and flushed easily.  This was secured to the fascia  with sutures of Prolene to make sure that it would not move and would  not flip.   I made sure everything was dry.  I did a final fluoro to check  positioning and everything appeared okay.   Incision was then closed with 3-0 Vicryl, 4-0 Monocryl subcuticular, and  Dermabond.   The patient tolerated the procedure well and there are no complications.  All counts were correct.      Currie Paris, M.D.  Electronically Signed     CJS/MEDQ  D:  05/11/2008  T:  05/11/2008  Job:  [id number]

## 2011-01-13 NOTE — Discharge Summary (Signed)
NAMECAILAH, REACH               ACCOUNT NO.:  [id number]   MEDICAL RECORD NO.:  [id number]          PATIENT TYPE:  INP   LOCATION:  5014                         FACILITY:  [facility]   PHYSICIAN:  Lenard Galloway. Mortenson, M.D.DATE OF BIRTH:  July 15, 1946   DATE OF ADMISSION:  07/05/2006  DATE OF DISCHARGE:  07/09/2006                               DISCHARGE SUMMARY   ADMISSION DIAGNOSIS:  Osteoarthritis of the right knee.   DISCHARGE DIAGNOSES:  1. Osteoarthritis of the right knee.  2. Hypertension.  3. Asthma.  4. History of seizures.   PROCEDURE:  Right total knee arthroplasty.   HISTORY OF PRESENT ILLNESS:  This is a 65 year old African-American  female with persistent pain in her right knee to the point where she is  having problems with activities of daily living.  Does have arthritis 3  x 2 without relief.  Viscous supplementation with only short term  benefits.  Has constant severe pain, worse with ambulation.  Wakes her  at nighttime.  She has radiographic bone-on-bone medial compartment.  Indicated now for right total knee arthroplasty.   HOSPITAL COURSE:  A 65 year old African-American female admitted  July 05, 2006 as appropriate laboratory tests were obtained as well  as 1 gram Ancef IV on-call to the operating room, was taken to the  operating room where she underwent a left only arthroplasty by Dr.  Rinaldo Ratel, assisted by Mliss Sax P.A.C.  Tolerated the procedure  well.  Continued on Ancef 1 gram IV x3 doses, Arixtra 2.5 mg  subcutaneous daily starting at 8 a.m. on July 06, 2006, was begun for  antithrombotic prophylaxis.  Foley was placed intraoperatively.  CPM 0  to 90 degrees for 6 to 8 hours per day.   Consults with PT, OT, and care management were made.  Partial weight  bearing 50% body weight only.  Out of bed to a chair the following day.  IV fluid rate was increased 100 per hour.  On July 07, 2006, she was  weaned to oral pain medicines.  The  PCA was discontinued and the IV with  saline locked.  She was taught Arixtra injections.  Placed on sliding  scale for her diabetes.  On July 07, 2006, she was given Claritin 10  mg p.o. daily p.r.n.  Her remainder hospital course was uneventful and  was discharged on the July 09, 2006, to return to the office on  Monday, 1 week from the time of discharge.   LABORATORY STUDIES:  Admitted with a hemoglobin 13.9, hematocrit 41.4%,  white count 9400, platelets 305,000.  Discharge hemoglobin 9.8,  hematocrit 29%, white count 11,500, platelets 236,000.  Preop  chemistries:  Sodium 139, potassium 3.8, chloride 105, CO2 29, glucose  147, BUN 12, creatinine 0.8, calcium 9.5.  Total protein 7.1, albumin  2.9, AST 18, ALT 21, ALP 72, total bilirubin 0.5.  Direct less than 0.1,  indirect was not calculated.  Discharge sodium 139, potassium 3.5,  chloride 103, CO2 29, glucose 174, BUN 5, creatinine 0.7, calcium 8.7.  Glycosylate hemoglobin was 6.7.  Urinalysis benign for voided urine with  only 40,000 colonies of multiple bacteria morphotypes present on  culture.  Blood type is O positive, antibody screen negative.   DISCHARGE INSTRUCTIONS:  She is to watch her carbs as previously noted.  No driving or lifting for 6 weeks.  Increase activity slowly.  May  shower.  Crutches weight bearing partially 50%.  Follow the blue  instruction sheet.   1. Arixtra 2.5 mg injected subcutaneous q.8 a.m.  Last dose July 12, 2006.  2. Begin aspirin July 13, 2006.  3. Percocet 5/325 1 to 2 tabs every 4 hours as needed for pain.  4. Robaxin 500 mg 1 tablet every 6 hours as needed for spasm.  5. Dilantin 350 mg daily at bedtime.  6. Benicar 12.5 mg daily to begin the next day post discharge.   Call for temperatures greater then 101.  [facility] for rehab.  CPM 0 to 90  degrees for 6 to 8 hours per day.  Keep the incision clean and dry and  cover daily.  Follow back up with Dr. Chaney Malling Monday  for evaluation.   Discharged in improved condition.      Oris Drone Petrarca, P.A.-C.    ______________________________  Lenard Galloway Chaney Malling, M.D.    BDP/MEDQ  D:  08/14/2006  T:  08/14/2006  Job:  [id number]

## 2011-01-13 NOTE — Op Note (Signed)
Shelly Simpson, Shelly Simpson               ACCOUNT NO.:  [id number]   MEDICAL RECORD NO.:  [id number]          PATIENT TYPE:  INP   LOCATION:  5014                         FACILITY:  [facility]   PHYSICIAN:  Lenard Galloway. Mortenson, M.D.DATE OF BIRTH:  1946-05-20   DATE OF PROCEDURE:  07/05/2006  DATE OF DISCHARGE:                                 OPERATIVE REPORT   PREOPERATIVE DIAGNOSIS:  Severe osteoarthritis, right knee.   POSTOPERATIVE DIAGNOSIS:  Severe osteoarthritis, right knee.   OPERATION:  Total knee replacement on the right using DePuy cemented  components with a standard plus right femoral component, a size 4 cemented  MBT keel tibial tray with a 12.5 poly insert and standard plus cemented  metal-backed patella.   SURGEON:  Lenard Galloway. Chaney Malling, M.D.   Threasa HeadsChestine Spore, MD   ANESTHESIA:  General.   DESCRIPTION OF PROCEDURE:  The patient placed on the operative table in the  supine position, a pneumatic tourniquet applied to the right upper thigh and  the entire  right lower extremity was prepped with DuraPrep and draped in  the usual manner.  A Vi-Drape was placed over the operative site. The leg is  wrapped  with an esmarch, tourniquet was elevated.  A straight incision made  starting above the patella and carried down to the tibial tubercle directly  in the midline.  Skin edges were retracted. Bleeders coagulated.  A long  medial parapatellar incision made and the patella was everted.  The knee was  flexed 90 degrees.  Both medial and lateral meniscus were excised.  Fat pad  was excised and cruciate ligaments were excised.  This gave excess access to  the anterior aspect of the knee.  A localized synovectomy was done and the  knee had a fair amount of scarring in the suprapatellar pouch.  Using Schanz  pins, 2 Schanz pins were placed in the distal femur, proximal tibia in the  standard manner and the femoral and tibial rays tibial were applied. Once  this was accomplished,  registration was done.  First the femoral head  centered at the pivot mechanical axis,  proximal and tibial plateau of the  distal femoral condyles and epicondyles were defined.  They registered in  the compute.  The tibial resection settings were registered and tibial  planning was then done.  Using the tibial resection guide, this was placed  in the proximal end of the tibia and using the computer this was set at  appropriate angle varus and valgus positioning and once I was satisfied with  the position, this was fixed in place with fixation pins.  The capture guide  was applied and the proximal end of the tibia was amputated.  Checking the  confirmation rays, the proximal end of the tibia cut was found to be almost  perfect.  At this point attention was then turned to soft tissue balancing.  This was done using the tension device both in flexion and extension.  Releases were done to allow this to balance and excellent balancing of the  collateral ligaments in flexed and extended positions was  achieved.  Next  the femoral implant planning was registered and following computer prompting  the anterior and posterior resection guide was placed on the femur, put into  position.  Following the computer prompting, it was locked in place.  The  __________ guides applied.  The anterior aspect of the femoral condyle was  amputated as was the posterior condyles.  A spacer block was inserted and  there was good balancing of the ligaments in flexion.  Distal femoral guide  #2:  The distal femoral resection guide was placed over the distal  femur  and manipulated into the proper position using the computer, and this was  locked in place and the femur was thin, resected and using a confirmation  guide minor touch-ups were done where this was in excellent position.  With  the leg in extension, a space block was inserted, and there was excellent  balancing of ligaments in extension.  The final femoral  guide #3 was placed  over the distal end of the femur and locked in place with fixation pins.  Chamfer cuts are made and drill holes were placed just in the femur and the  guide was removed.  Extra bones were removed from the posterior condyles.  Soft tissue release was done posteriorly.  Anterior and posterior cruciate  ligaments were completely released.  The popliteus was preserved.  The tibia  was then subluxed anteriorly using the keel and a single spike laterally.  A  size 4 trial fit very nicely with proximal cut tibia and this was locked in  place with fixation pins.  The smoke stack was applied, a drill hole was  made.  Smoke stack removed and the wind Kiel was driving down into the tibia  and this was locked into place.  A 12 mm poly trial was then inserted and  the femoral component trial was selected and placed over the distal end of  the femur and impacted.  The knee was then articulated and put through a  full range of motion.  There was full flexion, full extension almost  neutral, varus and valgus alignment.  There was slight hyperextension.  The  ligaments were balanced very nicely in both flexion and extension.  At this  point the patella was everted, cutting guide was applied and the posterior  aspect of the patella was resected.  Three holes were placed on the cut  surface of the patella and a final patella trial was put in place and it was  put through a full range of motion.  There was full flexion and full  extension.  A lateral release was not necessary.  All trial components were  removed.  Pulsating lavage was used.  All debris was removed.  At this time  all components were placed on the back table.  Glue was mixed.  Glue was  then applied to each component sequentially, was placed in the proximal  tibia and the tibial component inserted.  Excess glue was removed.  Glue was placed over the distal end of the femur and the distal femoral component was  impacted.   The poly had been put in place.  Glue was  placed posterior  aspect of the patella.  This was articulated in the holes and held in  position with the patellar clamp.  All excess glue was removed and the  patella was cured.  Once this was accomplished, osteotome was used to remove  all excess glue.  A great deal  of time was spent cleaning out any excess  glue or particles of glue.  The tibia poly was then removed.  The wound was  irrigated again with pulsating lavage and tourniquet was dropped.  Bleeders  were coagulated.  Good hemostasis was achieved.  No major bleeding was  found.  The final tibial poly was then snapped and fitted into place and the  knee put through full range of motion.  Again the patella tracked very  nicely.  Hemovac drain was inserted.  The long medial parapatellar incision  was closed with interrupted Ethibond.  Vicryl was used to close the  subcutaneous tissue.  Stainless steel staples was used to closed the skin.  Sterile dressings were applied.  The patient returned to the recovery room  in excellent condition.   DRAINS:  None.   COMPLICATIONS:  None.           ______________________________  Lenard Galloway. Chaney Malling, M.D.     RAM/MEDQ  D:  07/05/2006  T:  07/06/2006  Job:  [id number]

## 2011-01-13 NOTE — Cardiovascular Report (Signed)
[place]  Patient:    Shelly Simpson                       MRN: [id number] Proc. Date: 09/20/99 Adm. Date:  [id number] Attending:  Virgia Land CC:         Lindell Spar. Chestine Spore, M.D.             Cardiac Catheterization Laboratory                        Cardiac Catheterization  CINE NUMBER:  [id number]  REFERRING PHYSICIAN:  Lindell Spar. Chestine Spore, M.D.  PROCEDURES PERFORMED:  Left heart catheterization, selective coronary angiography, left ventricular function study, and descending aortography.  INDICATIONS:  This is a 65 year old black female who had recurrent typical angina with abnormal EKG and possibility of inferior and posterior infarction.  COMPLICATIONS:  None.  APPROACH:  Right femoral artery using 6-French diagnostic catheters.  RESULTS:  HEMODYNAMIC DATA:  The left ventricular pressure was 170/21.  The aortic pressure was 170/95.  LEFT VENTRICULOGRAM:  The left ventriculogram was essentially unremarkable.  AORTOGRAPHY:  The aortogram did not show any renal artery stenosis.  CORONARY ANATOMY: 1. The left main coronary artery was unremarkable.  2. The left anterior descending coronary artery was also unremarkable and it    wrapped around the apex of the heart.  The diagonal-1 and diagonal-2 vessels    were also unremarkable.  3. The left circumflex coronary artery was a large vessel and included obtuse    marginal branches #1 and #2, as well as small obtuse marginal branches #3    and #4.  These were all essentially unremarkable.  4. The right coronary artery was dominant and unremarkable.  There was a smaller    posterior descending coronary artery.  IMPRESSION: 1. Normal coronaries. 2. Normal left ventricular systolic function. 3. Normal renal arteries.  RECOMMENDATIONS:  This patient will continue medical therapy and noncardiac chest pain evaluation. DD:  09/20/99 TD:  09/21/99 Job: [id number]

## 2011-03-29 ENCOUNTER — Other Ambulatory Visit: Payer: Self-pay | Admitting: Oncology

## 2011-03-29 DIAGNOSIS — Z9889 Other specified postprocedural states: Secondary | ICD-10-CM

## 2011-03-29 DIAGNOSIS — Z853 Personal history of malignant neoplasm of breast: Secondary | ICD-10-CM

## 2011-04-19 ENCOUNTER — Ambulatory Visit
Admission: RE | Admit: 2011-04-19 | Discharge: 2011-04-19 | Disposition: A | Discharge status: Home / Self Care | Payer: BC Managed Care – PPO | Source: Ambulatory Visit | Attending: Oncology | Admitting: Oncology

## 2011-04-19 DIAGNOSIS — Z853 Personal history of malignant neoplasm of breast: Secondary | ICD-10-CM

## 2011-04-19 DIAGNOSIS — Z9889 Other specified postprocedural states: Secondary | ICD-10-CM

## 2011-05-24 ENCOUNTER — Other Ambulatory Visit: Payer: Self-pay | Admitting: Medical

## 2011-05-24 ENCOUNTER — Encounter (HOSPITAL_BASED_OUTPATIENT_CLINIC_OR_DEPARTMENT_OTHER): Payer: BC Managed Care – PPO | Admitting: Oncology

## 2011-05-24 DIAGNOSIS — Z170 Estrogen receptor positive status [ER+]: Secondary | ICD-10-CM

## 2011-05-24 DIAGNOSIS — C50119 Malignant neoplasm of central portion of unspecified female breast: Secondary | ICD-10-CM

## 2011-05-24 LAB — CBC WITH DIFFERENTIAL/PLATELET
Basophils Absolute: 0 10*3/uL (ref 0.0–0.1)
HCT: 42.3 % (ref 34.8–46.6)
HGB: 14.1 g/dL (ref 11.6–15.9)
MCH: 31.5 pg (ref 25.1–34.0)
MONO#: 0.3 10*3/uL (ref 0.1–0.9)
NEUT%: 59 % (ref 38.4–76.8)
WBC: 6 10*3/uL (ref 3.9–10.3)
lymph#: 2 10*3/uL (ref 0.9–3.3)

## 2011-05-24 LAB — COMPREHENSIVE METABOLIC PANEL
ALT: 16 U/L (ref 0–35)
Albumin: 4.3 g/dL (ref 3.5–5.2)
CO2: 24 mEq/L (ref 19–32)
Glucose, Bld: 145 mg/dL — ABNORMAL HIGH (ref 70–99)
Potassium: 4 mEq/L (ref 3.5–5.3)
Sodium: 140 mEq/L (ref 135–145)
Total Protein: 6.5 g/dL (ref 6.0–8.3)

## 2011-05-24 LAB — CANCER ANTIGEN 27.29: CA 27.29: 32 U/mL (ref 0–39)

## 2011-05-24 LAB — VITAMIN D 25 HYDROXY (VIT D DEFICIENCY, FRACTURES): Vit D, 25-Hydroxy: 33 ng/mL (ref 30–89)

## 2011-05-26 LAB — CBC
HCT: 43.7
MCHC: 33
MCV: 94.1
Platelets: 251
RDW: 12.2

## 2011-05-26 LAB — DIFFERENTIAL
Basophils Absolute: 0.1
Lymphocytes Relative: 31
Monocytes Absolute: 0.4
Monocytes Relative: 6
Neutro Abs: 4.2

## 2011-05-26 LAB — CANCER ANTIGEN 27.29: CA 27.29: 26

## 2011-05-26 LAB — COMPREHENSIVE METABOLIC PANEL
Albumin: 4
BUN: 12
Creatinine, Ser: 0.91
Total Protein: 7

## 2011-05-26 LAB — URINALYSIS, ROUTINE W REFLEX MICROSCOPIC
Bilirubin Urine: NEGATIVE
Leukocytes, UA: NEGATIVE
Nitrite: NEGATIVE
Specific Gravity, Urine: 1.019
pH: 6

## 2011-05-26 LAB — URINE MICROSCOPIC-ADD ON

## 2011-05-29 LAB — COMPREHENSIVE METABOLIC PANEL
Alkaline Phosphatase: 97
BUN: 11
CO2: 29
GFR calc non Af Amer: >60
Glucose, Bld: 111 — ABNORMAL HIGH
Potassium: 4
Total Protein: 6.3

## 2011-05-29 LAB — CBC
HCT: 39.8
Hemoglobin: 13
MCHC: 32.6
Platelets: 182
RBC: 4.21
RDW: 12.5
RDW: 12.9
WBC: 6.8

## 2011-05-29 LAB — DIFFERENTIAL
Basophils Absolute: 0.1
Basophils Absolute: 0
Basophils Relative: 1
Eosinophils Absolute: 0.1
Eosinophils Absolute: 0
Eosinophils Relative: 0
Lymphocytes Relative: 30
Lymphs Abs: 2.1
Monocytes Relative: 6
Neutro Abs: 7.2
Neutrophils Relative %: 69
Neutrophils Relative %: 61

## 2011-05-29 LAB — URINALYSIS, ROUTINE W REFLEX MICROSCOPIC
Bilirubin Urine: NEGATIVE
Ketones, ur: NEGATIVE
Nitrite: NEGATIVE
Protein, ur: NEGATIVE
Urobilinogen, UA: 0.2
pH: 6.5

## 2011-05-29 LAB — PROTIME-INR
INR: 1
INR: 1
Prothrombin Time: 13.7
Prothrombin Time: 13.8

## 2011-05-29 LAB — BASIC METABOLIC PANEL
BUN: 14
Calcium: 9.1
Creatinine, Ser: 0.88
GFR calc non Af Amer: >60
Glucose, Bld: 136 — ABNORMAL HIGH

## 2011-05-31 LAB — BASIC METABOLIC PANEL
CO2: 26
Calcium: 10.3
Creatinine, Ser: 0.92
GFR calc Af Amer: >60
GFR calc non Af Amer: >60
Glucose, Bld: 94
Sodium: 139

## 2011-05-31 LAB — CBC
Hemoglobin: 14.6
MCHC: 32.9
RDW: 12.4

## 2011-06-12 ENCOUNTER — Other Ambulatory Visit: Payer: Self-pay | Admitting: Oncology

## 2011-06-12 ENCOUNTER — Encounter (HOSPITAL_BASED_OUTPATIENT_CLINIC_OR_DEPARTMENT_OTHER): Payer: BC Managed Care – PPO | Admitting: Oncology

## 2011-06-12 DIAGNOSIS — Z170 Estrogen receptor positive status [ER+]: Secondary | ICD-10-CM

## 2011-06-12 DIAGNOSIS — Z853 Personal history of malignant neoplasm of breast: Secondary | ICD-10-CM

## 2011-06-12 DIAGNOSIS — C50119 Malignant neoplasm of central portion of unspecified female breast: Secondary | ICD-10-CM

## 2011-06-21 ENCOUNTER — Other Ambulatory Visit: Payer: BC Managed Care – PPO

## 2011-07-17 ENCOUNTER — Other Ambulatory Visit: Payer: Self-pay | Admitting: Oncology

## 2011-07-17 DIAGNOSIS — C50919 Malignant neoplasm of unspecified site of unspecified female breast: Secondary | ICD-10-CM

## 2011-07-31 ENCOUNTER — Ambulatory Visit
Admission: RE | Admit: 2011-07-31 | Discharge: 2011-07-31 | Disposition: A | Discharge status: Home / Self Care | Payer: BC Managed Care – PPO | Source: Ambulatory Visit | Attending: Oncology | Admitting: Oncology

## 2011-07-31 ENCOUNTER — Telehealth: Payer: Self-pay | Admitting: *Deleted

## 2011-07-31 DIAGNOSIS — C50919 Malignant neoplasm of unspecified site of unspecified female breast: Secondary | ICD-10-CM

## 2011-07-31 NOTE — Telephone Encounter (Signed)
[facility] is [facility].  Pt. Was scheduled for bone density.  She has been on Arimidex for 74yrs.  She had previous Bone Density with Dr. Nicholas Lose and radiologist felt it was better to have pt. Get this on the same machine - would give more accurate results.  So they have scheduled her back at Dr. Johnn Hai office with the referral for 09/01/11.

## 2011-08-27 ENCOUNTER — Ambulatory Visit: Payer: BC Managed Care – PPO

## 2011-09-01 ENCOUNTER — Other Ambulatory Visit: Payer: Self-pay | Admitting: Gynecology

## 2011-09-30 ENCOUNTER — Telehealth: Payer: Self-pay | Admitting: *Deleted

## 2011-09-30 NOTE — Telephone Encounter (Signed)
Pt was called on cell first to be reminded of future appts , pt stated that it was Saturday and would like to be called back on Monday. I asked if it would be ok to leave appt information on home phone answering machine, pt agreed this was ok. A message was left on home phone that stated appt date of April 19,2013(lab@[phone]  and Dr. Donnie Coffin @10am 

## 2011-11-21 ENCOUNTER — Telehealth: Payer: Self-pay | Admitting: *Deleted

## 2011-11-21 NOTE — Telephone Encounter (Signed)
patient confirmed over phone the new date and time on 12-11-2011 starting at 9:30am

## 2011-12-11 ENCOUNTER — Other Ambulatory Visit (HOSPITAL_BASED_OUTPATIENT_CLINIC_OR_DEPARTMENT_OTHER): Payer: Managed Care, Other (non HMO)

## 2011-12-11 ENCOUNTER — Ambulatory Visit (HOSPITAL_BASED_OUTPATIENT_CLINIC_OR_DEPARTMENT_OTHER): Payer: Managed Care, Other (non HMO) | Admitting: Oncology

## 2011-12-11 ENCOUNTER — Telehealth: Payer: Self-pay | Admitting: Oncology

## 2011-12-11 VITALS — BP 127/76 | HR 88 | Temp 98.6°F | Ht 65.5 in | Wt 225.5 lb

## 2011-12-11 DIAGNOSIS — C50919 Malignant neoplasm of unspecified site of unspecified female breast: Secondary | ICD-10-CM

## 2011-12-11 DIAGNOSIS — E559 Vitamin D deficiency, unspecified: Secondary | ICD-10-CM

## 2011-12-11 DIAGNOSIS — C50119 Malignant neoplasm of central portion of unspecified female breast: Secondary | ICD-10-CM

## 2011-12-11 LAB — CBC WITH DIFFERENTIAL/PLATELET
Basophils Absolute: 0 10*3/uL (ref 0.0–0.1)
Eosinophils Absolute: 0.1 10*3/uL (ref 0.0–0.5)
HGB: 13.7 g/dL (ref 11.6–15.9)
MONO#: 0.4 10*3/uL (ref 0.1–0.9)
NEUT#: 4.5 10*3/uL (ref 1.5–6.5)
RBC: 4.34 10*6/uL (ref 3.70–5.45)
RDW: 12.1 % (ref 11.2–14.5)
WBC: 6.6 10*3/uL (ref 3.9–10.3)
lymph#: 1.6 10*3/uL (ref 0.9–3.3)
nRBC: 0 % (ref 0–0)

## 2011-12-11 NOTE — Telephone Encounter (Signed)
gve the pt her nov 2013 appt calendar along with the mammo appt in aug.

## 2011-12-11 NOTE — Progress Notes (Signed)
Hematology and Oncology Follow Up Visit  Shelly Simpson [id number] 25-Mar-1946 66 y.o. 12/11/2011 10:44 AM PCP dr Margaretmary Bayley   Principle Diagnosis: T1c N1 ER PR HER-2 positive breast cancer status post lumpectomy followed by node dissection 8/29 followed by 4 cycles of carboplatin Abraxane with Herceptin after 2 cycles of [facility], radiation therapy completed March 2010 status post q. 3 week Herceptin for a year, previously on Femara now on Arimidex  Interim History:  There have been no intercurrent illness, hospitalizations or medication changes.she is doing well without complaints. She continues on dilantin ( past 53 y!), and is now on oral hypoglycemics. She has had a bone density thru her ob-gyn office. Medications: I have reviewed the patient's current medications. Prior to Admission:   Allergies:  Allergies  Allergen Reactions  . Iohexol      Desc: pt states in '80's wheezing; dyspnea w/ contrast--pt needs pre meds in future; slg 04/24/2008     Past Medical History, Surgical history, Social history, and Family History were reviewed and updated.  Review of Systems: Constitutional:  Negative for fever, chills, night sweats, anorexia, weight loss, pain. Cardiovascular: no chest pain or dyspnea on exertion Respiratory: no cough, shortness of breath, or wheezing Neurological: negative Dermatological: negative ENT: negative Skin Gastrointestinal: negative Genito-Urinary: negative Hematological and Lymphatic: negative Breast: negative Musculoskeletal: negative Remaining ROS negative.  Physical Exam: Blood pressure 127/76, pulse 88, temperature 98.6 F (37 C), height 5' 5.5" (1.664 m), weight 225 lb 8 oz (102.286 kg). ECOG:  General appearance: alert, cooperative and appears stated age Head: Normocephalic, without obvious abnormality, atraumatic Neck: no adenopathy, no carotid bruit, no JVD, supple, symmetrical, trachea midline and thyroid not enlarged, symmetric, no  tenderness/mass/nodules Lymph nodes: Cervical, supraclavicular, and axillary nodes normal. Cardiac : regular rate and rhythm, no murmurs or gallops Pulmonary:clear to auscultation bilaterally and normal percussion bilaterally Breasts: inspection negative, no nipple discharge or bleeding, no masses or nodularity palpable Abdomen:soft, non-tender; bowel sounds normal; no masses,  no organomegaly Extremities negative Neuro: alert, oriented, normal speech, no focal findings or movement disorder noted  Lab Results: Lab Results  Component Value Date   WBC 6.6 12/11/2011   HGB 13.7 12/11/2011   HCT 40.8 12/11/2011   MCV 94.0 12/11/2011   PLT 265 12/11/2011     Chemistry      Component Value Date/Time   NA 140 05/24/2011 0921   NA 140 05/24/2011 0921   K 4.0 05/24/2011 0921   K 4.0 05/24/2011 0921   CL 103 05/24/2011 0921   CL 103 05/24/2011 0921   CO2 24 05/24/2011 0921   CO2 24 05/24/2011 0921   BUN 13 05/24/2011 0921   BUN 13 05/24/2011 0921   CREATININE 0.84 05/24/2011 0921   CREATININE 0.84 05/24/2011 0921      Component Value Date/Time   CALCIUM 9.6 05/24/2011 0921   CALCIUM 9.6 05/24/2011 0921   ALKPHOS 85 05/24/2011 0921   ALKPHOS 85 05/24/2011 0921   AST 14 05/24/2011 0921   AST 14 05/24/2011 0921   ALT 16 05/24/2011 0921   ALT 16 05/24/2011 0921   BILITOT 0.3 05/24/2011 0921   BILITOT 0.3 05/24/2011 0921      .pathology. Radiological Studies: chest X-ray n/a Mammogram N/a- due 8/13 Bone density n/a  Impression and Plan: Ms Granberry is doing well, she has lost weight and wil continue to do so . i wil see her in 6 months with a f/u mammogram  More than 50% of the visit  was spent in patient-related counselling   Pierce Crane, MD 4/15/201310:44 AM

## 2011-12-12 LAB — COMPREHENSIVE METABOLIC PANEL
ALT: 13 U/L (ref 0–35)
AST: 15 U/L (ref 0–37)
Albumin: 4.3 g/dL (ref 3.5–5.2)
Calcium: 9.8 mg/dL (ref 8.4–10.5)
Chloride: 103 mEq/L (ref 96–112)
Potassium: 3.8 mEq/L (ref 3.5–5.3)
Total Protein: 6.9 g/dL (ref 6.0–8.3)

## 2011-12-13 ENCOUNTER — Ambulatory Visit: Payer: BC Managed Care – PPO | Admitting: Oncology

## 2011-12-13 ENCOUNTER — Other Ambulatory Visit: Payer: BC Managed Care – PPO | Admitting: Lab

## 2011-12-15 ENCOUNTER — Ambulatory Visit: Payer: BC Managed Care – PPO | Admitting: Oncology

## 2011-12-15 ENCOUNTER — Other Ambulatory Visit: Payer: BC Managed Care – PPO | Admitting: Lab

## 2012-04-04 ENCOUNTER — Other Ambulatory Visit: Payer: Self-pay | Admitting: *Deleted

## 2012-04-04 DIAGNOSIS — C50919 Malignant neoplasm of unspecified site of unspecified female breast: Secondary | ICD-10-CM

## 2012-04-04 DIAGNOSIS — E559 Vitamin D deficiency, unspecified: Secondary | ICD-10-CM

## 2012-04-04 MED ORDER — ANASTROZOLE 1 MG PO TABS: 1.0000 mg | ORAL_TABLET | Freq: Every day | ORAL | Status: DC

## 2012-05-08 ENCOUNTER — Ambulatory Visit
Admission: RE | Admit: 2012-05-08 | Discharge: 2012-05-08 | Disposition: A | Discharge status: Home / Self Care | Payer: Managed Care, Other (non HMO) | Source: Ambulatory Visit | Attending: Oncology | Admitting: Oncology

## 2012-05-08 DIAGNOSIS — E559 Vitamin D deficiency, unspecified: Secondary | ICD-10-CM

## 2012-05-08 DIAGNOSIS — C50919 Malignant neoplasm of unspecified site of unspecified female breast: Secondary | ICD-10-CM

## 2012-07-10 ENCOUNTER — Encounter: Payer: Self-pay | Admitting: *Deleted

## 2012-07-10 NOTE — Progress Notes (Signed)
Received call from patient stating she needs to cancel her appts. For tomorrow 07/11/12 and she will call back to reschedule.

## 2012-07-11 ENCOUNTER — Other Ambulatory Visit: Payer: Managed Care, Other (non HMO) | Admitting: Lab

## 2012-07-11 ENCOUNTER — Ambulatory Visit: Payer: Managed Care, Other (non HMO) | Admitting: Oncology

## 2012-07-24 ENCOUNTER — Telehealth: Payer: Self-pay | Admitting: *Deleted

## 2012-07-24 NOTE — Telephone Encounter (Signed)
Patient had cancelled her appointment from that was in 06-2012 patient called back on 07-24-2012 requesting on appointment with the md unfortulatey the first opening will be 09-2012 patient stated she was not pleased and I did apoloize to the patient for the long way out appointment

## 2012-08-05 ENCOUNTER — Telehealth: Payer: Self-pay | Admitting: *Deleted

## 2012-08-05 NOTE — Telephone Encounter (Signed)
Returned patient phone call to inform the patient that dr.rubin has had no cancellations as of 08-05-2012 at 1:30pm

## 2012-09-24 ENCOUNTER — Telehealth: Payer: Self-pay | Admitting: *Deleted

## 2012-09-24 NOTE — Telephone Encounter (Signed)
Called pt concerning r/s f/u appt.  Pt request Dr. Darnelle Catalan.  Pt scheduled to see Dr. Darnelle Catalan on 09/27/12 at 1200.  Confirmed new date and time.

## 2012-09-27 ENCOUNTER — Telehealth: Payer: Self-pay | Admitting: Oncology

## 2012-09-27 ENCOUNTER — Ambulatory Visit (HOSPITAL_BASED_OUTPATIENT_CLINIC_OR_DEPARTMENT_OTHER): Payer: Managed Care, Other (non HMO) | Admitting: Oncology

## 2012-09-27 VITALS — BP 111/70 | HR 86 | Temp 98.7°F | Resp 14 | Wt 230.3 lb

## 2012-09-27 DIAGNOSIS — Z170 Estrogen receptor positive status [ER+]: Secondary | ICD-10-CM

## 2012-09-27 DIAGNOSIS — C50119 Malignant neoplasm of central portion of unspecified female breast: Secondary | ICD-10-CM

## 2012-09-27 DIAGNOSIS — E559 Vitamin D deficiency, unspecified: Secondary | ICD-10-CM

## 2012-09-27 DIAGNOSIS — C50919 Malignant neoplasm of unspecified site of unspecified female breast: Secondary | ICD-10-CM

## 2012-09-27 MED ORDER — ANASTROZOLE 1 MG PO TABS: 1.0000 mg | ORAL_TABLET | Freq: Every day | ORAL | Status: DC

## 2012-09-29 DIAGNOSIS — C50919 Malignant neoplasm of unspecified site of unspecified female breast: Secondary | ICD-10-CM | POA: Insufficient documentation

## 2012-09-29 NOTE — Progress Notes (Signed)
ID: Shelly Simpson   DOB: 1945-12-08  MR#: [id number]  CSN#:[id number]  PCP: Laurena Slimmer, MD GYN: Beather Arbour SU: Cicero Duck OTHER MD: Dorothy Puffer   HISTORY OF PRESENT ILLNESS: From Dr. Theron Arista Rubin's noted 04/29/2008: She underwent a screening mammogram in January.  She has had previous six monthly screening mammogram for evaluation of benign appearing calcifications.  She subsequently had a six-month followup mammogram in July 16th, 2009.  This showed indeterminate calcifications in the retroareolar right breast.  Biopsy was recommended.  Invasive ductal cancer associated with DCIS was noted.  The invasive component was ER and PR positive at 99% and 51% respectively, HER-2 was 2+, and proliferative index was 10 %.  The patient had an MRI scan on March 19, 2008.  This showed a 1 x 1 x 0.8 cm area of DCIS with small amount of invasive cancer 6 o'clock position.  The other exam is otherwise unremarkable.  The patient elected to undergo a lumpectomy and sentinel lymph node evaluation on 04/08/2008.  Final pathology showed invasive ductal cancer grade 2 of 3 measuring 1.2 cm.  The invasive component was less than 0.1 cm to the deep margin.  Additional margin was removed, which showed invasive cancer with DCIS focally less than 0.1 cm with a new margin.  Two sentinel lymph nodes were identified, one sentinel lymph node had micro metastases noted.  Of note is that the final FISH testing was performed on the primary tumor, which was shown to be positive for amplification in the ratio of 2.45 Subsequent history is as detailed below.  INTERVAL HISTORY: Shelly Simpson returns today with her husband Shelly Simpson for followup of her breast cancer. She is establishing herself in my service today.  REVIEW OF SYSTEMS: The patient is tolerating her anastrozole generally well, her chief complaints they're being vaginal dryness and loss of libido. Her hot flashes are well-controlled on clonidine. A detailed review of systems  today was otherwise stable.  PAST MEDICAL HISTORY: No past medical history on file. Seizures, hypertension, hypercholesterolemia  PAST SURGICAL HISTORY: No past surgical history on file. Status post hysterectomy with bilateral stalpingo- oophorectomy, status post laminectomy, status post tonsillectomy and adenoidectomy, status post knee surgery  FAMILY HISTORY No family history on file. The patient's father died in his 17s from congestive heart failure and what sounds like a pulmonary embolus. The patient's mother died at the age of 67, from ovarian cancer, which was diagnosed shortly before her birth. Also the patient's mother is brother had a diagnosis of breast cancer, late in life.   GYNECOLOGIC HISTORY: Menarche age 65, first live birth age 67, she is GX P2, undergoing hysterectomy in 30. She took hormone replacement for approximately 16 years.  SOCIAL HISTORY: She used to work for [employer]. Her husband Shelly Simpson is also retired. Daughter Cathren Sween works in [place] as a Radiation protection practitioner; daughter August Saucer teaches biology in college; she has a PhD degree. The patient has 1 grandchild.   ADVANCED DIRECTIVES: Not in place  HEALTH MAINTENANCE: History  Substance Use Topics  . Smoking status: Not on file  . Smokeless tobacco: Not on file  . Alcohol Use: Not on file     Colonoscopy:  PAP:  Bone density:  Lipid panel:  Allergies  Allergen Reactions  . Iohexol      Desc: pt states in '80's wheezing; dyspnea w/ contrast--pt needs pre meds in future; slg 04/24/2008     Current Outpatient Prescriptions  Medication Sig Dispense Refill  . anastrozole (  ARIMIDEX) 1 MG tablet Take 1 tablet (1 mg total) by mouth daily.  90 tablet  3  . anastrozole (ARIMIDEX) 1 MG tablet Take 1 tablet (1 mg total) by mouth daily.  90 tablet  3  . calcium citrate-vitamin D (CITRACAL+D) 315-200 MG-UNIT per tablet Take 1 tablet by mouth 2 (two) times daily.      . cetirizine (ZYRTEC) 10 MG  tablet Take 10 mg by mouth daily.      . cloNIDine (CATAPRES) 0.1 MG tablet Take 0.1 mg by mouth 2 (two) times daily.      . magnesium citrate SOLN Take 296 mLs by mouth once.      . Multiple Vitamin (MULTIVITAMIN) capsule Take 1 capsule by mouth daily.      Marland Kitchen olmesartan-hydrochlorothiazide (BENICAR HCT) 20-12.5 MG per tablet Take 1 tablet by mouth daily.      . phenytoin (DILANTIN) 100 MG ER capsule Take by mouth 3 (three) times daily.      Marland Kitchen RESVERATROL 100 MG CAPS Take by mouth.        OBJECTIVE: Middle-aged woman in no acute distress Filed Vitals:   09/27/12 1230  BP: 111/70  Pulse: 86  Temp: 98.7 F (37.1 C)  Resp: 14     There is no height on file to calculate BMI.    ECOG FS: 1  Sclerae unicteric Oropharynx clear No cervical or supraclavicular adenopathy Lungs no rales or rhonchi Heart regular rate and rhythm Abd benign MSK no focal spinal tenderness, no peripheral edema Neuro: nonfocal Breasts: The right breast is status post lumpectomy and radiation. There is no evidence of local recurrence. The right axilla is benign. The left breast is unremarkable.   LAB RESULTS: Lab Results  Component Value Date   WBC 6.6 12/11/2011   NEUTROABS 4.5 12/11/2011   HGB 13.7 12/11/2011   HCT 40.8 12/11/2011   MCV 94.0 12/11/2011   PLT 265 12/11/2011      Chemistry      Component Value Date/Time   NA 139 12/11/2011 0922   K 3.8 12/11/2011 0922   CL 103 12/11/2011 0922   CO2 24 12/11/2011 0922   BUN 17 12/11/2011 0922   CREATININE 0.79 12/11/2011 0922      Component Value Date/Time   CALCIUM 9.8 12/11/2011 0922   ALKPHOS 73 12/11/2011 0922   AST 15 12/11/2011 0922   ALT 13 12/11/2011 0922   BILITOT 0.3 12/11/2011 0922       Lab Results  Component Value Date   LABCA2 32 05/24/2011    No components found with this basename: [id number]    No results found for this basename: INR:1;PROTIME:1 in the last 168 hours  Urinalysis    Component Value Date/Time   COLORURINE YELLOW  05/20/2008 1548   APPEARANCEUR CLEAR 05/20/2008 1548   LABSPEC 1.008 05/20/2008 1548   PHURINE 6.5 05/20/2008 1548   GLUCOSEU NEGATIVE 05/20/2008 1548   HGBUR NEGATIVE 05/20/2008 1548   BILIRUBINUR NEGATIVE 05/20/2008 1548   KETONESUR NEGATIVE 05/20/2008 1548   PROTEINUR NEGATIVE 05/20/2008 1548   UROBILINOGEN 0.2 05/20/2008 1548   NITRITE NEGATIVE 05/20/2008 1548   LEUKOCYTESUR NEGATIVE MICROSCOPIC NOT DONE ON URINES WITH NEGATIVE PROTEIN, BLOOD, LEUKOCYTES, NITRITE, OR GLUCOSE <1000 mg/dL. 05/20/2008 1548    STUDIES: No results found. Next mammogram is due September 2014; repeat bone density test is planned for February.  ASSESSMENT: 67 y.o. [place] woman status post right lumpectomy and sentinel lymph node sampling 04/08/2008 for apT1c pN1, stage IIA invasive  ductal carcinoma, grade 2, estrogen receptor 99% and progesterone receptor 51% positive, with an MIB-1 of 10%, and HER-2 amplification by CISH, with a ratio of 2.45.  (1) treated adjuvantly with carboplatin and Taxotere x2 cycles, orally tolerated, followed by 4 cycles of weekly carboplatin/Abraxane completed 07/21/2008  (2) adjuvant radiation therapy to the right breast completed 10/30/2008  (3) started on letrozole March of 2010, switched to anastrozole February of 2011 because of arthralgias/myalgias  PLAN: Chrishonda is tolerating the anastrozole reasonably well, and the plan is to continue that until March of 2015. We went over her adjuvant! prognosis today and she understands her risk of recurrence within 10 years of surgery given the treatment she has received will be in the 7-8%. With the family history noted above I think she will benefit from genetic counseling and this has been requested. She will also have a bone density prior to her next visit here in July. She knows to call for any problems that may develop before the next visit.   Redmond Whittley C    09/29/2012

## 2012-09-30 ENCOUNTER — Telehealth: Payer: Self-pay | Admitting: Oncology

## 2012-09-30 NOTE — Telephone Encounter (Signed)
S/w pt re appt for genetics 4/3 (per 2/2 pof).

## 2012-10-01 ENCOUNTER — Other Ambulatory Visit: Payer: Managed Care, Other (non HMO) | Admitting: Lab

## 2012-10-01 ENCOUNTER — Ambulatory Visit: Payer: Managed Care, Other (non HMO) | Admitting: Oncology

## 2012-10-14 ENCOUNTER — Other Ambulatory Visit: Payer: Self-pay | Admitting: Gynecology

## 2012-11-07 ENCOUNTER — Other Ambulatory Visit: Payer: Self-pay | Admitting: *Deleted

## 2012-11-07 DIAGNOSIS — E559 Vitamin D deficiency, unspecified: Secondary | ICD-10-CM

## 2012-11-07 DIAGNOSIS — C50919 Malignant neoplasm of unspecified site of unspecified female breast: Secondary | ICD-10-CM

## 2012-11-07 MED ORDER — ANASTROZOLE 1 MG PO TABS: 1.0000 mg | ORAL_TABLET | Freq: Every day | ORAL | Status: DC

## 2012-11-28 ENCOUNTER — Other Ambulatory Visit: Payer: Managed Care, Other (non HMO) | Admitting: Lab

## 2012-11-28 ENCOUNTER — Encounter: Payer: Managed Care, Other (non HMO) | Admitting: Genetic Counselor

## 2013-01-14 ENCOUNTER — Encounter: Payer: Self-pay | Admitting: Dietician

## 2013-01-14 NOTE — Progress Notes (Signed)
Breast Cancer Nutrition Class Attendance Note  Date: 01/14/2013  Pt attended [facility]'s Breast Cancer Nutrition Class, "Food For Your Fight". Pt was educated on basic cancer nutrition principles, including plant based diet and principles from AICR  ([employer] Institute [employer]) about latest nutrition findings and recommendations. Questions answered. Handouts and recipes provided.   Ruberta Holck A. Kayan, RD, LDN Pager: [phone] 

## 2013-02-11 ENCOUNTER — Other Ambulatory Visit: Payer: Self-pay | Admitting: Internal Medicine

## 2013-02-11 ENCOUNTER — Ambulatory Visit (HOSPITAL_COMMUNITY)
Admission: RE | Admit: 2013-02-11 | Discharge: 2013-02-11 | Disposition: A | Discharge status: Home / Self Care | Payer: Managed Care, Other (non HMO) | Source: Ambulatory Visit | Attending: Internal Medicine | Admitting: Internal Medicine

## 2013-02-11 DIAGNOSIS — R0989 Other specified symptoms and signs involving the circulatory and respiratory systems: Secondary | ICD-10-CM | POA: Insufficient documentation

## 2013-02-11 DIAGNOSIS — R05 Cough: Secondary | ICD-10-CM | POA: Insufficient documentation

## 2013-02-11 DIAGNOSIS — R059 Cough, unspecified: Secondary | ICD-10-CM

## 2013-03-17 ENCOUNTER — Ambulatory Visit (INDEPENDENT_AMBULATORY_CARE_PROVIDER_SITE_OTHER): Payer: Managed Care, Other (non HMO) | Admitting: Emergency Medicine

## 2013-03-17 VITALS — BP 116/80 | HR 93 | Temp 98.3°F | Resp 18 | Ht 65.5 in | Wt 231.4 lb

## 2013-03-17 DIAGNOSIS — R059 Cough, unspecified: Secondary | ICD-10-CM

## 2013-03-17 DIAGNOSIS — J4530 Mild persistent asthma, uncomplicated: Secondary | ICD-10-CM

## 2013-03-17 DIAGNOSIS — H6691 Otitis media, unspecified, right ear: Secondary | ICD-10-CM

## 2013-03-17 DIAGNOSIS — J029 Acute pharyngitis, unspecified: Secondary | ICD-10-CM

## 2013-03-17 DIAGNOSIS — R05 Cough: Secondary | ICD-10-CM

## 2013-03-17 DIAGNOSIS — J45909 Unspecified asthma, uncomplicated: Secondary | ICD-10-CM

## 2013-03-17 DIAGNOSIS — H6690 Otitis media, unspecified, unspecified ear: Secondary | ICD-10-CM

## 2013-03-17 LAB — POCT CBC
Lymph, poc: 2.5 (ref 0.6–3.4)
MCH, POC: 31.1 pg (ref 27–31.2)
MCHC: 31.7 g/dL — AB (ref 31.8–35.4)
MID (cbc): 0.8 (ref 0–0.9)
MPV: 8.4 fL (ref 0–99.8)
POC LYMPH PERCENT: 22.9 %L (ref 10–50)
POC MID %: 7 %M (ref 0–12)
Platelet Count, POC: 281 10*3/uL (ref 142–424)
RBC: 4.73 M/uL (ref 4.04–5.48)
RDW, POC: 12.8 %
WBC: 11.1 10*3/uL — AB (ref 4.6–10.2)

## 2013-03-17 MED ORDER — ALBUTEROL SULFATE HFA 108 (90 BASE) MCG/ACT IN AERS: 2.0000 | INHALATION_SPRAY | Freq: Four times a day (QID) | RESPIRATORY_TRACT | Status: DC | PRN

## 2013-03-17 MED ORDER — ALBUTEROL SULFATE (2.5 MG/3ML) 0.083% IN NEBU
2.5000 mg | INHALATION_SOLUTION | Freq: Once | RESPIRATORY_TRACT | Status: AC
  Administered 2013-03-17: 2.5 mg via RESPIRATORY_TRACT

## 2013-03-17 MED ORDER — AMOXICILLIN-POT CLAVULANATE 875-125 MG PO TABS: 1.0000 | ORAL_TABLET | Freq: Two times a day (BID) | ORAL | Status: DC

## 2013-03-17 MED ORDER — BENZONATATE 100 MG PO CAPS: 100.0000 mg | ORAL_CAPSULE | Freq: Three times a day (TID) | ORAL | Status: DC | PRN

## 2013-03-17 NOTE — Progress Notes (Signed)
  Subjective:    Patient ID: Shelly Simpson, female    DOB: [birth year], 67 y.o.   MRN: [id number]  HPI presents today with fatigue, ST, cough, and PND that started Saturday. Cough is productive/yellow.  Her throat hurts more on R side and when she swallows it causes pain R ear. Was treated a couple weeks ago for similar sxs with Augmentin and it resolved. She was treated while in [place].  She just returned this past Saturday. States while she was there her allergies seemed to flare up. She was having a lot of drainage. Was seen by Dr. Chestine Spore 3 weeks ago was given albuterol and had normal CXR. Was around her grandson while in [place] who has been sneezing and coughing.    Review of Systems history of breast cancer     Objective:   Physical Exam Peak flow 295 post 300 The patient is alert and cooperative she is not in any distress. Her neck is supple. Her chest exam reveals a few fine rales in the left base.. Cardiac exam is regular rate without murmurs rubs or gallops Results for orders placed in visit on 03/17/13  POCT CBC      Result Value Range   WBC 11.1 (*) 4.6 - 10.2 K/uL   Lymph, poc 2.5  0.6 - 3.4   POC LYMPH PERCENT 22.9  10 - 50 %L   MID (cbc) 0.8  0 - 0.9   POC MID % 7.0  0 - 12 %M   POC Granulocyte 7.8 (*) 2 - 6.9   Granulocyte percent 70.1  37 - 80 %G   RBC 4.73  4.04 - 5.48 M/uL   Hemoglobin 14.7  12.2 - 16.2 g/dL   HCT, POC 40.9  81.1 - 47.9 %   MCV 98.2 (*) 80 - 97 fL   MCH, POC 31.1  27 - 31.2 pg   MCHC 31.7 (*) 31.8 - 35.4 g/dL   RDW, POC 91.4     Platelet Count, POC 281  142 - 424 K/uL   MPV 8.4  0 - 99.8 fL  POCT RAPID STREP A (OFFICE)      Result Value Range   Rapid Strep A Screen Negative  Negative    postirradiation the right TM is slightly erythematous and possibly fluid behind the drum    Assessment & Plan:  . We'll irrigate the right ear. She will be given an albuterol nebulizer to see if that improves her cough. Her white count is up her  urine looks infected and she continues to have chest congestion. We'll treat with Augmentin and albuterol inhaler and Tessalon for cough

## 2013-03-25 ENCOUNTER — Telehealth: Payer: Self-pay | Admitting: Oncology

## 2013-03-25 NOTE — Telephone Encounter (Signed)
returned pt call and r/s appt to Sept per pt request

## 2013-03-26 ENCOUNTER — Ambulatory Visit: Payer: Managed Care, Other (non HMO) | Admitting: Physician Assistant

## 2013-04-08 ENCOUNTER — Other Ambulatory Visit: Payer: Self-pay

## 2013-04-08 ENCOUNTER — Other Ambulatory Visit: Payer: Self-pay | Admitting: Oncology

## 2013-04-08 DIAGNOSIS — Z853 Personal history of malignant neoplasm of breast: Secondary | ICD-10-CM

## 2013-04-21 ENCOUNTER — Telehealth: Payer: Self-pay | Admitting: *Deleted

## 2013-04-21 NOTE — Telephone Encounter (Signed)
Pt called per concern of " went to the dentist and she felt a tumor like thing in my neck ".  Per discussion, Christeena states dentist does a external neck evaluation " because she says this is an area that is often overlooked ",-  " she felt a small lump and made me find it and told me I needed to let someone know and to keep having it tested until they found something ".  Per inquiry by this RN- Rexann describes lump " like a small green pea " Located on the bottom of her neck " right above the shoulder ".  Ebonye could not recall if lump was movable and when asked about shape- she states " it was just like I said- shaped like a small green pea"  Presently she cannot locate abnormality " but then my cancer was hidden too ".  Meleni stated in addition that she is scheduled to be seen by her primary MD, Dr Margaretmary Bayley on Wed 8/27.  Per conversation plan at present is pt will follow up with primary MD as well as this note will be given to MD for review.

## 2013-05-09 ENCOUNTER — Ambulatory Visit
Admission: RE | Admit: 2013-05-09 | Discharge: 2013-05-09 | Disposition: A | Discharge status: Home / Self Care | Payer: Managed Care, Other (non HMO) | Source: Ambulatory Visit | Attending: Oncology | Admitting: Oncology

## 2013-05-09 ENCOUNTER — Telehealth: Payer: Self-pay | Admitting: Physician Assistant

## 2013-05-09 DIAGNOSIS — Z853 Personal history of malignant neoplasm of breast: Secondary | ICD-10-CM

## 2013-05-19 ENCOUNTER — Ambulatory Visit: Payer: Managed Care, Other (non HMO) | Admitting: Physician Assistant

## 2013-06-03 ENCOUNTER — Encounter: Payer: Self-pay | Admitting: Physician Assistant

## 2013-06-03 ENCOUNTER — Telehealth: Payer: Self-pay | Admitting: Oncology

## 2013-06-03 ENCOUNTER — Ambulatory Visit (HOSPITAL_BASED_OUTPATIENT_CLINIC_OR_DEPARTMENT_OTHER): Payer: Managed Care, Other (non HMO) | Admitting: Physician Assistant

## 2013-06-03 VITALS — BP 115/73 | HR 99 | Temp 98.6°F | Resp 18 | Ht 65.0 in | Wt 230.1 lb

## 2013-06-03 DIAGNOSIS — C50119 Malignant neoplasm of central portion of unspecified female breast: Secondary | ICD-10-CM

## 2013-06-03 DIAGNOSIS — Z853 Personal history of malignant neoplasm of breast: Secondary | ICD-10-CM

## 2013-06-03 DIAGNOSIS — C50911 Malignant neoplasm of unspecified site of right female breast: Secondary | ICD-10-CM

## 2013-06-03 DIAGNOSIS — R928 Other abnormal and inconclusive findings on diagnostic imaging of breast: Secondary | ICD-10-CM

## 2013-06-03 DIAGNOSIS — R921 Mammographic calcification found on diagnostic imaging of breast: Secondary | ICD-10-CM

## 2013-06-03 NOTE — Telephone Encounter (Signed)
, °

## 2013-06-03 NOTE — Progress Notes (Signed)
ID: Shelly Simpson   DOB: Dec 28, 1945  MR#: [id number]  CSN#:[id number]  PCP: Laurena Slimmer, MD GYN: Beather Arbour SU: Cicero Duck OTHER MD: Dorothy Puffer  CHIEF COMPLAINT:  Right Breast Cancer   HISTORY OF PRESENT ILLNESS: From Dr. Theron Arista Rubin's noted 04/29/2008: She underwent a screening mammogram in January.  She has had previous six monthly screening mammogram for evaluation of benign appearing calcifications.  She subsequently had a six-month followup mammogram in July 16th, 2009.  This showed indeterminate calcifications in the retroareolar right breast.  Biopsy was recommended.  Invasive ductal cancer associated with DCIS was noted.  The invasive component was ER and PR positive at 99% and 51% respectively, HER-2 was 2+, and proliferative index was 10 %.  The patient had an MRI scan on March 19, 2008.  This showed a 1 x 1 x 0.8 cm area of DCIS with small amount of invasive cancer 6 o'clock position.  The other exam is otherwise unremarkable.  The patient elected to undergo a lumpectomy and sentinel lymph node evaluation on 04/08/2008.  Final pathology showed invasive ductal cancer grade 2 of 3 measuring 1.2 cm.  The invasive component was less than 0.1 cm to the deep margin.  Additional margin was removed, which showed invasive cancer with DCIS focally less than 0.1 cm with a new margin.  Two sentinel lymph nodes were identified, one sentinel lymph node had micro metastases noted.  Of note is that the final FISH testing was performed on the primary tumor, which was shown to be positive for amplification in the ratio of 2.45 Subsequent history is as detailed below.  INTERVAL HISTORY: Shelly Simpson returns alone today  for followup of her right breast cancer. She is very tired, and has been under quite a bit of stress over the past couple of weeks. Unfortunately, her 22 year-old mother-in-law passed away this past 2023/01/20, and they are in the process of planning her funeral.  [name] continues on  anastrozole with good tolerance. She's also on clonidine at night, and has subsequently had no problems with hot flashes. She has no vaginal dryness and denies any abnormal vaginal bleeding. She also denies any increased joint pain.  Interval history is notable for a Shelly Simpson having noted a "lump" at the base of her right neck, just above the right shoulder. This was noted during a dental visit in [place], and I will note that she had just been diagnosed with an upper respiratory infection approximately 3-4 weeks prior. She can no longer palpate this abnormality. It still worries her, however, because her breast cancer was also right-sided.  Interval history is also notable for Shelly Simpson having had her annual mammogram on 05/09/2013 which revealed new right breast calcifications in the lumpectomy bed. Although this was felt to be likely related to fat necrosis, they have suggested a short interval followup in 6 months.   REVIEW OF SYSTEMS: [name] has had no illnesses since the upper respiratory infection in July. She denies any recent fevers or chills. She is fatigued and under some additional stress as noted above. She's eating well denies any nausea or change in bowel or bladder habits. She's had no increased cough, increased shortness of breath, chest pain, or palpitations. She denies any abnormal headaches and has had no dizziness or change in vision. Currently, she also denies any unusual myalgias, arthralgias, bony pain, or peripheral swelling.  A detailed review of systems is otherwise noncontributory.  PAST MEDICAL HISTORY: No past medical history on file. Seizures, hypertension,  hypercholesterolemia  PAST SURGICAL HISTORY: Past Surgical History  Procedure Laterality Date  . Breast surgery    . Abdominal hysterectomy    . Joint replacement    Status post hysterectomy with bilateral stalpingo- oophorectomy, status post laminectomy, status post tonsillectomy and adenoidectomy, status post knee  surgery  FAMILY HISTORY Family History  Problem Relation Age of Onset  . Cancer Mother   The patient's father died in his 90s from congestive heart failure and what sounds like a pulmonary embolus. The patient's mother died at the age of 44, from ovarian cancer, which was diagnosed shortly before her birth. Also the patient's mother is brother had a diagnosis of breast cancer, late in life.   GYNECOLOGIC HISTORY: Menarche age 19, first live birth age 10, she is GX P2, undergoing hysterectomy in 51. She took hormone replacement for approximately 16 years.  SOCIAL HISTORY:  (Updated 06/03/2013) She used to work for [employer]. Her husband Renae Fickle is also retired. Daughter Shelly Simpson works in [place] as a Radiation protection practitioner; daughter Shelly Simpson teaches biology in college; she has a PhD degree. The patient has 1 grandchild.   ADVANCED DIRECTIVES: Not in place  HEALTH MAINTENANCE: (Updated 06/03/2013) History  Substance Use Topics  . Smoking status: Never Smoker   . Smokeless tobacco: Never Used  . Alcohol Use: No     Colonoscopy: Dec 2012, Dr. Kinnie Scales  PAP: Feb 2014, Dr. Nicholas Lose  Bone density:  Lipid panel: UTD, Dr. Chestine Spore  Allergies  Allergen Reactions  . Iohexol      Desc: pt states in '80's wheezing; dyspnea w/ contrast--pt needs pre meds in future; slg 04/24/2008   . Sulfa Antibiotics     Current Outpatient Prescriptions  Medication Sig Dispense Refill  . albuterol (PROVENTIL HFA;VENTOLIN HFA) 108 (90 BASE) MCG/ACT inhaler Inhale 2 puffs into the lungs every 6 (six) hours as needed for wheezing.  1 Inhaler  0  . anastrozole (ARIMIDEX) 1 MG tablet Take 1 tablet (1 mg total) by mouth daily.  30 tablet  3  . calcium citrate-vitamin D (CITRACAL+D) 315-200 MG-UNIT per tablet Take 1 tablet by mouth 2 (two) times daily.      . cetirizine (ZYRTEC) 10 MG tablet Take 10 mg by mouth daily as needed.       . cloNIDine (CATAPRES) 0.1 MG tablet Take 0.1 mg by mouth daily.       .  fluticasone (FLONASE) 50 MCG/ACT nasal spray Place 2 sprays into the nose daily as needed.       Marland Kitchen olmesartan-hydrochlorothiazide (BENICAR HCT) 20-12.5 MG per tablet Take 1 tablet by mouth daily.      . phenytoin (DILANTIN) 100 MG ER capsule Take by mouth 3 (three) times daily.      Marland Kitchen amoxicillin-clavulanate (AUGMENTIN) 875-125 MG per tablet Take 1 tablet by mouth 2 (two) times daily as needed.      . Multiple Vitamin (MULTIVITAMIN) capsule Take 1 capsule by mouth daily.       No current facility-administered medications for this visit.    OBJECTIVE: Middle-aged woman who appears tired but is in no acute distress Filed Vitals:   06/03/13 0900  BP: 115/73  Pulse: 99  Temp: 98.6 F (37 C)  Resp: 18     Body mass index is 38.29 kg/(m^2).    ECOG FS: 0 Filed Weights   06/03/13 0900  Weight: 230 lb 1.6 oz (104.373 kg)   Physical Exam: HEENT:  Sclerae anicteric.  Oropharynx clear.  NODES:  No  cervical or supraclavicular lymphadenopathy palpated. Careful palpation of the right neck and right supraclavicular area revealed no palpable abnormalities today. BREAST EXAM:  Right breast is status post lumpectomy with surgical removal of a complex. There is no evidence of local recurrence. Left breast is unremarkable. Axillae are benign bilaterally, no palpable adenopathy.reolar  LUNGS:  Clear to auscultation bilaterally.  No wheezes or rhonchi HEART:  Regular rate and rhythm. No murmur  ABDOMEN:  Soft,  obese, nontender.  Positive bowel sounds.  MSK:  No focal spinal tenderness to palpation.  full range of motion in the upper extremities.  EXTREMITIES:  No peripheral edema.   NEURO:  Nonfocal. Well oriented.  Fatigued affect.    LAB RESULTS: Lab Results  Component Value Date   WBC 11.1* 03/17/2013   NEUTROABS 4.5 12/11/2011   HGB 14.7 03/17/2013   HCT 46.4 03/17/2013   MCV 98.2* 03/17/2013   PLT 265 12/11/2011      Chemistry      Component Value Date/Time   NA 139 12/11/2011 0922   K 3.8  12/11/2011 0922   CL 103 12/11/2011 0922   CO2 24 12/11/2011 0922   BUN 17 12/11/2011 0922   CREATININE 0.79 12/11/2011 0922      Component Value Date/Time   CALCIUM 9.8 12/11/2011 0922   ALKPHOS 73 12/11/2011 0922   AST 15 12/11/2011 0922   ALT 13 12/11/2011 0922   BILITOT 0.3 12/11/2011 0922      STUDIES: Mm Digital Diagnostic Bilat  05/09/2013   *RADIOLOGY REPORT*  Clinical Data:  Status post right lumpectomy for cancer in 2009.  DIGITAL DIAGNOSTIC BILATERAL MAMMOGRAM WITH CAD DIGITAL BREAST TOMOSYNTHESIS  Digital breast tomosynthesis images are acquired in two projections.  These images are reviewed in combination with the digital mammogram, confirming the findings below.  Comparison: Multiple priors  Findings:  ACR Breast Density Category c:  The breast tissue is heterogeneously dense, which may obscure small masses.  Standard CC and MLO views of both breasts were obtained in addition to 3-D tomosythesis images.  Magnification views of the right breast were also performed.  There are new calcifications at the lumpectomy site in the slightly upper right breast. These are likely secondary to fat necrosis. Otherwise, postlumpectomy changes are stable.  Mammographic images were processed with CAD.  IMPRESSION: New right breast calcifications in the lumpectomy bed, likely related to fat necrosis.  Negative left breast.  RECOMMENDATION: 41-month follow-up right diagnostic mammogram. .  I have discussed the findings and recommendations with the patient. Results were also provided in writing at the conclusion of the visit.  If applicable, a reminder letter will be sent to the patient regarding her next appointment.  BI-RADS CATEGORY 3:  Probably benign finding(s) - short interval follow-up suggested   Original Report Authenticated By: Jerene Dilling, M.D.    ASSESSMENT: 67 y.o. [place] woman   (1)  status post right lumpectomy and sentinel lymph node sampling 04/08/2008 for apT1c pN1, stage IIA  invasive ductal carcinoma, grade 2, estrogen receptor 99% and progesterone receptor 51% positive, with an MIB-1 of 10%, and HER-2 amplification by CISH, with a ratio of 2.45.  (2) treated adjuvantly with carboplatin and Taxotere x2 cycles, orally tolerated, followed by 4 cycles of weekly carboplatin/Abraxane completed 07/21/2008  (3) adjuvant radiation therapy to the right breast completed 10/30/2008  (4) started on letrozole March of 2010, switched to anastrozole February of 2011 because of arthralgias/myalgias. The plan is to continue antiestrogen therapy for a total of  5 years (until March 2015).   PLAN:  Ofilia is tolerating the anastrozole well, and the plan is to continue that until March of 2015. I have ordered a right diagnostic mammogram and right breast ultrasound for early March to followup on the right breast calcifications noted on mammogram in September. She will see Dr. Darnelle Catalan soon thereafter for labs and followup visit to review those results, and discuss discontinuing her antiestrogen therapy. (She tells me she sees Dr. Chestine Spore regularly, and recently had her blood drawn through his office. She tells me "everything was fine" and declines additional blood work today.)  We discussed the "lump" Gaige had in her neck 2 months ago. At this time, as stated above, we cannot locate the abnormality but she is still reasonably worried. We discussed an ultrasound of the neck to evaluate for adenopathy. She declines an ultrasound at this time, but will call us if she reconsiders. At that time, we would simply order an ultrasound of the right neck for further evaluation of a palpable abnormality and assessment for adenopathy.  Keria voices understanding and agreement with our plan today. She will call with any changes or problems prior to her next appointment here.   Amita Atayde    06/03/2013

## 2013-10-03 ENCOUNTER — Other Ambulatory Visit: Payer: Self-pay | Admitting: *Deleted

## 2013-10-03 MED ORDER — ALBUTEROL SULFATE HFA 108 (90 BASE) MCG/ACT IN AERS: 2.0000 | INHALATION_SPRAY | Freq: Four times a day (QID) | RESPIRATORY_TRACT | Status: DC | PRN

## 2013-10-22 ENCOUNTER — Other Ambulatory Visit: Payer: Self-pay | Admitting: *Deleted

## 2013-10-22 DIAGNOSIS — E559 Vitamin D deficiency, unspecified: Secondary | ICD-10-CM

## 2013-10-22 DIAGNOSIS — C50919 Malignant neoplasm of unspecified site of unspecified female breast: Secondary | ICD-10-CM

## 2013-10-22 MED ORDER — ANASTROZOLE 1 MG PO TABS: 1.0000 mg | ORAL_TABLET | Freq: Every day | ORAL | Status: DC

## 2013-11-07 ENCOUNTER — Telehealth: Payer: Self-pay | Admitting: Oncology

## 2013-11-07 NOTE — Telephone Encounter (Signed)
returned pt call and lvm with confirmation of March appt.Shelly KitchenMarland Simpson

## 2013-11-10 ENCOUNTER — Ambulatory Visit
Admission: RE | Admit: 2013-11-10 | Discharge: 2013-11-10 | Disposition: A | Discharge status: Home / Self Care | Payer: Managed Care, Other (non HMO) | Source: Ambulatory Visit | Attending: Physician Assistant | Admitting: Physician Assistant

## 2013-11-10 DIAGNOSIS — Z853 Personal history of malignant neoplasm of breast: Secondary | ICD-10-CM

## 2013-11-10 DIAGNOSIS — R921 Mammographic calcification found on diagnostic imaging of breast: Secondary | ICD-10-CM

## 2013-11-18 ENCOUNTER — Other Ambulatory Visit: Payer: Managed Care, Other (non HMO)

## 2013-11-18 ENCOUNTER — Telehealth: Payer: Self-pay | Admitting: Oncology

## 2013-11-18 NOTE — Telephone Encounter (Signed)
pt called to r/s lab...done...pt aware of new d.t °

## 2013-11-18 NOTE — Telephone Encounter (Signed)
per pof from 3/24 move pts 3/31 appt to dr Owens Loffler.  s/w pt and she was very upset that we were doing this,i advised her that this was just a one time visit and that dr gm would remain her phy.  pt again did not want to do this and per dr gm i advised ther that she could see amy b or him at the next avail slot which was done.  i also advised the pt to call and s/w jeff heffelfinger and she stated that she will write him a letter with  all of her concerns.  she thanked me for my time and we reconfirmed her appts Webb Silversmith

## 2013-11-19 ENCOUNTER — Other Ambulatory Visit: Payer: Managed Care, Other (non HMO)

## 2013-12-10 ENCOUNTER — Other Ambulatory Visit (HOSPITAL_BASED_OUTPATIENT_CLINIC_OR_DEPARTMENT_OTHER): Payer: Managed Care, Other (non HMO)

## 2013-12-10 DIAGNOSIS — C50911 Malignant neoplasm of unspecified site of right female breast: Secondary | ICD-10-CM

## 2013-12-10 DIAGNOSIS — C50119 Malignant neoplasm of central portion of unspecified female breast: Secondary | ICD-10-CM

## 2013-12-10 LAB — COMPREHENSIVE METABOLIC PANEL (CC13)
ALK PHOS: 96 U/L (ref 40–150)
ALT: 16 U/L (ref 0–55)
ANION GAP: 10 meq/L (ref 3–11)
AST: 14 U/L (ref 5–34)
Albumin: 4.1 g/dL (ref 3.5–5.0)
BILIRUBIN TOTAL: 0.31 mg/dL (ref 0.20–1.20)
BUN: 17.6 mg/dL (ref 7.0–26.0)
CO2: 26 meq/L (ref 22–29)
CREATININE: 0.9 mg/dL (ref 0.6–1.1)
Calcium: 10.2 mg/dL (ref 8.4–10.4)
Chloride: 106 mEq/L (ref 98–109)
Glucose: 129 mg/dl (ref 70–140)
Potassium: 4.1 mEq/L (ref 3.5–5.1)
SODIUM: 141 meq/L (ref 136–145)
TOTAL PROTEIN: 7.2 g/dL (ref 6.4–8.3)

## 2013-12-10 LAB — CBC WITH DIFFERENTIAL/PLATELET
BASO%: 1.1 % (ref 0.0–2.0)
Basophils Absolute: 0.1 10*3/uL (ref 0.0–0.1)
EOS%: 1.2 % (ref 0.0–7.0)
Eosinophils Absolute: 0.1 10*3/uL (ref 0.0–0.5)
HCT: 42.7 % (ref 34.8–46.6)
HGB: 13.9 g/dL (ref 11.6–15.9)
LYMPH%: 26.7 % (ref 14.0–49.7)
MCH: 30.5 pg (ref 25.1–34.0)
MCHC: 32.6 g/dL (ref 31.5–36.0)
MCV: 93.4 fL (ref 79.5–101.0)
MONO#: 0.7 10*3/uL (ref 0.1–0.9)
MONO%: 8.2 % (ref 0.0–14.0)
NEUT#: 5.3 10*3/uL (ref 1.5–6.5)
NEUT%: 62.8 % (ref 38.4–76.8)
PLATELETS: 260 10*3/uL (ref 145–400)
RBC: 4.57 10*6/uL (ref 3.70–5.45)
RDW: 12.4 % (ref 11.2–14.5)
WBC: 8.4 10*3/uL (ref 3.9–10.3)
lymph#: 2.2 10*3/uL (ref 0.9–3.3)

## 2013-12-16 ENCOUNTER — Telehealth: Payer: Self-pay | Admitting: *Deleted

## 2013-12-16 NOTE — Telephone Encounter (Signed)
Called pt to inform her that Dr. Jana Hakim is not able to see her tomorrow and she is very upset.  She did agree to reschedule and I confirmed 12/23/13 appt w/ pt.  She refuses to see anyone other than GM.

## 2013-12-17 ENCOUNTER — Ambulatory Visit: Payer: Managed Care, Other (non HMO) | Admitting: Oncology

## 2013-12-23 ENCOUNTER — Telehealth: Payer: Self-pay | Admitting: Oncology

## 2013-12-23 ENCOUNTER — Ambulatory Visit (HOSPITAL_BASED_OUTPATIENT_CLINIC_OR_DEPARTMENT_OTHER): Payer: Managed Care, Other (non HMO) | Admitting: Oncology

## 2013-12-23 VITALS — BP 99/58 | HR 94 | Temp 98.2°F | Resp 18 | Ht 65.0 in | Wt 248.0 lb

## 2013-12-23 DIAGNOSIS — C50919 Malignant neoplasm of unspecified site of unspecified female breast: Secondary | ICD-10-CM

## 2013-12-23 DIAGNOSIS — C50119 Malignant neoplasm of central portion of unspecified female breast: Secondary | ICD-10-CM

## 2013-12-23 DIAGNOSIS — Z170 Estrogen receptor positive status [ER+]: Secondary | ICD-10-CM

## 2013-12-23 NOTE — Telephone Encounter (Signed)
, °

## 2013-12-23 NOTE — Progress Notes (Signed)
ID: Shelly Simpson   DOB: 1946-08-08  MR#: [id number]  CSN#:[id number]  PCP: Foye Spurling, MD GYN: Rexford Maus SU: [facility] OTHER MD: Kyung Rudd   BREAST CANCER HISTORY:  From Dr. Collier Salina Rubin's noted 04/29/2008:  She underwent a screening mammogram in January.  She has had previous six monthly screening mammogram for evaluation of benign appearing calcifications.  She subsequently had a six-month followup mammogram in July 16th, 2009.  This showed indeterminate calcifications in the retroareolar right breast.  Biopsy was recommended.  Invasive ductal cancer associated with DCIS was noted.  The invasive component was ER and PR positive at 99% and 51% respectively, HER-2 was 2+, and proliferative index was 10 %.  The patient had an MRI scan on March 19, 2008.  This showed a 1 x 1 x 0.8 cm area of DCIS with small amount of invasive cancer 6 o'clock position.  The other exam is otherwise unremarkable.  The patient elected to undergo a lumpectomy and sentinel lymph node evaluation on 04/08/2008.  Final pathology showed invasive ductal cancer grade 2 of 3 measuring 1.2 cm.  The invasive component was less than 0.1 cm to the deep margin.  Additional margin was removed, which showed invasive cancer with DCIS focally less than 0.1 cm with a new margin.  Two sentinel lymph nodes were identified, one sentinel lymph node had micro metastases noted.  Of note is that the final FISH testing was performed on the primary tumor, which was shown to be positive for amplification in the ratio of 2.45  Subsequent history is as detailed below.  INTERVAL HISTORY: Shelly Simpson returns today for followup of her breast cancer. She will be completing 5 years of antiestrogen therapy this month and we had planned to have a "graduation ceremony". However it turns out the genetics referral we had placed previously never took place. Shelly Simpson has a mother with ovarian cancer and a mother is brother with female breast cancer. She is at  high risk of carrying a BRCA mutation.  REVIEW OF SYSTEMS: Gen. she is doing "okay". She still feels anxious about the possibility of recurrence and coming here of course brings all that anxiety to the surface. She is however not having any symptoms suggestive of recurrent or systemic disease. She is having mild seasonal allergies. She had a knee replacement. A detailed review of systems today was otherwise noncontributory.  PAST MEDICAL HISTORY: No past medical history on file. Seizures, hypertension, hypercholesterolemia  PAST SURGICAL HISTORY: Past Surgical History  Procedure Laterality Date  . Breast surgery    . Abdominal hysterectomy    . Joint replacement     Status post hysterectomy with bilateral stalpingo- oophorectomy, status post laminectomy, status post tonsillectomy and adenoidectomy, status post knee surgery  FAMILY HISTORY Family History  Problem Relation Age of Onset  . Cancer Mother    The patient's father died in his 19s from congestive heart failure and what sounds like a pulmonary embolus. The patient's mother died at the age of 35, from ovarian cancer, which was diagnosed shortly before her birth. Also the patient's mother is brother had a diagnosis of breast cancer, late in life.   GYNECOLOGIC HISTORY: Menarche age 68, first live birth age 68, she is GX P2, undergoing hysterectomy in 68. She took hormone replacement for approximately 16 years.  SOCIAL HISTORY: She used to work for [employer]. Her husband Eddie Dibbles is also retired. Daughter Shelly Simpson works in [place] as a Audiological scientist; daughter Shelly Simpson teaches biology  in college; she has a PhD degree. The patient has 1 grandchild.   ADVANCED DIRECTIVES: Not in place  HEALTH MAINTENANCE: History  Substance Use Topics  . Smoking status: Never Smoker   . Smokeless tobacco: Never Used  . Alcohol Use: No     Colonoscopy:  PAP:  Bone density:  Lipid panel:  Allergies  Allergen Reactions   . Iohexol      Desc: pt states in '80's wheezing; dyspnea w/ contrast--pt needs pre meds in future; slg 04/24/2008   . Sulfa Antibiotics     Current Outpatient Prescriptions  Medication Sig Dispense Refill  . albuterol (PROAIR HFA) 108 (90 BASE) MCG/ACT inhaler Inhale 2 puffs into the lungs every 6 (six) hours as needed.  3 Inhaler  0  . amoxicillin-clavulanate (AUGMENTIN) 875-125 MG per tablet Take 1 tablet by mouth 2 (two) times daily as needed.      Marland Kitchen anastrozole (ARIMIDEX) 1 MG tablet Take 1 tablet (1 mg total) by mouth daily.  90 tablet  1  . calcium citrate-vitamin D (CITRACAL+D) 315-200 MG-UNIT per tablet Take 1 tablet by mouth 2 (two) times daily.      . cetirizine (ZYRTEC) 10 MG tablet Take 10 mg by mouth daily as needed.       . cloNIDine (CATAPRES) 0.1 MG tablet Take 0.1 mg by mouth daily.       . fluticasone (FLONASE) 50 MCG/ACT nasal spray Place 2 sprays into the nose daily as needed.       . Multiple Vitamin (MULTIVITAMIN) capsule Take 1 capsule by mouth daily.      Marland Kitchen olmesartan-hydrochlorothiazide (BENICAR HCT) 20-12.5 MG per tablet Take 1 tablet by mouth daily.      . phenytoin (DILANTIN) 100 MG ER capsule Take by mouth 3 (three) times daily.       No current facility-administered medications for this visit.    OBJECTIVE: Middle-aged woman who appears stated age 68 Vitals:   12/23/13 1537  BP: 99/58  Pulse: 94  Temp: 98.2 F (36.8 C)  Resp: 18     Body mass index is 41.27 kg/(m^2).    ECOG FS: 0  Sclerae unicteric, EOMs intact Oropharynx clear and moist No cervical or supraclavicular adenopathy Lungs no rales or rhonchi Heart regular rate and rhythm Abd soft, obese, nontender, positive bowel sounds MSK no focal spinal tenderness, no upper extremity lymphedema Neuro: nonfocal, well oriented, appropriate affect Breasts: The right breast is status post radiation and lumpectomy. There is no evidence of local recurrence. The right axilla is benign. The left  breast is unremarkable.   LAB RESULTS: Lab Results  Component Value Date   WBC 8.4 12/10/2013   NEUTROABS 5.3 12/10/2013   HGB 13.9 12/10/2013   HCT 42.7 12/10/2013   MCV 93.4 12/10/2013   PLT 260 12/10/2013      Chemistry      Component Value Date/Time   NA 141 12/10/2013 1017   NA 139 12/11/2011 0922   K 4.1 12/10/2013 1017   K 3.8 12/11/2011 0922   CL 103 12/11/2011 0922   CO2 26 12/10/2013 1017   CO2 24 12/11/2011 0922   BUN 17.6 12/10/2013 1017   BUN 17 12/11/2011 0922   CREATININE 0.9 12/10/2013 1017   CREATININE 0.79 12/11/2011 0922      Component Value Date/Time   CALCIUM 10.2 12/10/2013 1017   CALCIUM 9.8 12/11/2011 0922   ALKPHOS 96 12/10/2013 1017   ALKPHOS 73 12/11/2011 0922   AST 14 12/10/2013  1017   AST 15 12/11/2011 0922   ALT 16 12/10/2013 1017   ALT 13 12/11/2011 0922   BILITOT 0.31 12/10/2013 1017   BILITOT 0.3 12/11/2011 0922       Lab Results  Component Value Date   LABCA2 32 05/24/2011    No components found with this basename: BOFBP102    No results found for this basename: INR,  in the last 168 hours  Urinalysis    Component Value Date/Time   COLORURINE YELLOW 05/20/2008 [place] 05/20/2008 1548   LABSPEC 1.008 05/20/2008 1548   PHURINE 6.5 05/20/2008 1548   GLUCOSEU NEGATIVE 05/20/2008 [place] 05/20/2008 [place] 05/20/2008 [place] 05/20/2008 1548   PROTEINUR NEGATIVE 05/20/2008 1548   UROBILINOGEN 0.2 05/20/2008 1548   NITRITE NEGATIVE 05/20/2008 1548   LEUKOCYTESUR NEGATIVE MICROSCOPIC NOT DONE ON URINES WITH NEGATIVE PROTEIN, BLOOD, LEUKOCYTES, NITRITE, OR GLUCOSE <1000 mg/dL. 05/20/2008 1548    STUDIES: DIGITAL DIAGNOSTIC RIGHT MAMMOGRAM WITH CAD  COMPARISON: May 09, 2013, May 08, 2012, August 20 to  2012  ACR Breast Density Category b: There are scattered areas of  fibroglandular density.  FINDINGS:  Cc and MLO views of the right breast, spot magnification CC and   lateral views of the right breast are submitted. Stable postsurgical  scar is unchanged. Coarse benign appearing calcifications are  identified within the right breast. There are no suspicious  microcalcifications.  Mammographic images were processed with CAD.  IMPRESSION:  Benign findings.  RECOMMENDATION:  Bilateral diagnostic mammogram back on schedule.  I have discussed the findings and recommendations with the patient.  Results were also provided in writing at the conclusion of the  visit. If applicable, a reminder letter will be sent to the patient  regarding the next appointment.  BI-RADS CATEGORY 2: Benign Finding(s)  Electronically Signed  By: Abelardo Diesel M.D.  On: 11/10/2013 10:48   ASSESSMENT: 68 y.o. [place] woman status post right lumpectomy and sentinel lymph node sampling 04/08/2008 for apT1c pN1, stage IIA invasive ductal carcinoma, grade 2, estrogen receptor 99% and progesterone receptor 51% positive, with an MIB-1 of 10%, and HER-2 amplification by CISH, with a ratio of 2.45.  (1) treated adjuvantly with carboplatin and Taxotere x2 cycles, poorly tolerated, followed by 4 cycles of weekly carboplatin Abraxane completed 07/21/2008  (2) adjuvant radiation therapy to the right breast completed 10/30/2008  (3) started on letrozole March of 2010, switched to anastrozole February of 2011 because of arthralgias/myalgias  (4) genetic testing pending  (a) the patient is status post TAH/BSO remotely  PLAN:  I thought we might be able to release Ms. Valencia to Dr. Ainsley Spinner care, but somehow the genetic testing that was requested previously was never done. I remain concerned that she may carry a BRCA mutation and this would require further discussion as well as a change in her followup (namely yearly MRIs in addition to yearly mammography).   Accordingly we are going to continue the anastrozole for now. She is being set up for genetic counseling and testing. She will return  to see me in 6 months. By that time we should have the results and can make a decision regarding further followup and also further antiestrogen therapy.  The patient is a good understanding of this plan. She agrees with it. She will call with any problems that may develop before next visit here.   Virgie Dad Magrinat    12/23/2013

## 2013-12-24 ENCOUNTER — Telehealth: Payer: Self-pay | Admitting: *Deleted

## 2013-12-24 NOTE — Telephone Encounter (Signed)
Received a referral from Dr. Jana Hakim for pt to see a Dietitian.  Called and confirmed 01/28/14 genetic appt w/ pt.

## 2014-01-28 ENCOUNTER — Other Ambulatory Visit: Payer: Managed Care, Other (non HMO)

## 2014-03-28 ENCOUNTER — Other Ambulatory Visit: Payer: Self-pay | Admitting: Oncology

## 2014-05-22 ENCOUNTER — Other Ambulatory Visit: Payer: Self-pay | Admitting: Oncology

## 2014-05-22 ENCOUNTER — Other Ambulatory Visit: Payer: Self-pay

## 2014-05-22 ENCOUNTER — Telehealth: Payer: Self-pay | Admitting: *Deleted

## 2014-05-22 DIAGNOSIS — Z853 Personal history of malignant neoplasm of breast: Secondary | ICD-10-CM

## 2014-05-22 DIAGNOSIS — Z9889 Other specified postprocedural states: Secondary | ICD-10-CM

## 2014-05-22 NOTE — Telephone Encounter (Signed)
Call transferred from scheduling per pt's call inquiring about BRCA testing.  Per discussion Shelly Simpson states md discussed above at last office visit ( April 2015 ).   " I am scheduled to see Dr Jana Hakim 10/29 was was wondering how long it takes to get that testing back and then how much it cost "  " I am not working but I have insurance thru my husband "  This RN informed pt above is scheduled thru the breast program and her inquiries will be forwarded appropriately and call returned to her by that department or by this RN.  Shelly Simpson verbalized understanding.  Return call number given for best contact as [phone].

## 2014-05-28 ENCOUNTER — Other Ambulatory Visit: Payer: Managed Care, Other (non HMO)

## 2014-05-28 ENCOUNTER — Encounter: Payer: Self-pay | Admitting: Genetic Counselor

## 2014-05-28 ENCOUNTER — Ambulatory Visit (HOSPITAL_BASED_OUTPATIENT_CLINIC_OR_DEPARTMENT_OTHER): Payer: Managed Care, Other (non HMO) | Admitting: Genetic Counselor

## 2014-05-28 DIAGNOSIS — Z803 Family history of malignant neoplasm of breast: Secondary | ICD-10-CM

## 2014-05-28 DIAGNOSIS — Z8041 Family history of malignant neoplasm of ovary: Secondary | ICD-10-CM | POA: Insufficient documentation

## 2014-05-28 DIAGNOSIS — Z315 Encounter for genetic counseling: Secondary | ICD-10-CM

## 2014-05-28 DIAGNOSIS — C50911 Malignant neoplasm of unspecified site of right female breast: Secondary | ICD-10-CM

## 2014-05-28 DIAGNOSIS — Z808 Family history of malignant neoplasm of other organs or systems: Secondary | ICD-10-CM

## 2014-05-28 DIAGNOSIS — Z853 Personal history of malignant neoplasm of breast: Secondary | ICD-10-CM | POA: Insufficient documentation

## 2014-05-28 NOTE — Progress Notes (Signed)
Patient Name: Shelly Simpson Patient Age: 68 y.o. Encounter Date: 05/28/2014  Referring Physician: Lurline Del, MD  Primary Care Provider: Foye Spurling, MD   [name], a 68 y.o. female, is being seen at the [facility] due to a personal history of breats cancer and family history of breast and ovarian cancer. She presents to clinic today to discuss the possibility of a hereditary predisposition to cancer and discuss whether genetic testing is warranted.  HISTORY OF PRESENT ILLNESS: Shelly Simpson was diagnosed with right breast cancer (IDC/DCIS) at the age of 50. She is s/p lumpectomy, radiation, chemotherapy and Herceptin.  The breast tumor was ER positive, PR positive, and HER2 positive.  Shelly Simpson reported she had a TAH/BSO in the late 1990s due to a large uterine fibroid.  Past Medical History  Diagnosis Date  . History of breast cancer   . Family history of ovarian cancer   . Family history of breast cancer in female     Past Surgical History  Procedure Laterality Date  . Breast surgery    . Abdominal hysterectomy    . Joint replacement      History   Social History  . Marital Status: Married    Spouse Name: N/A    Number of Children: N/A  . Years of Education: N/A   Social History Main Topics  . Smoking status: Never Smoker   . Smokeless tobacco: Never Used  . Alcohol Use: No  . Drug Use: No  . Sexual Activity: Not on file   Other Topics Concern  . Not on file   Social History Narrative  . No narrative on file     FAMILY HISTORY:   During the visit, a 4-generation pedigree was obtained. Family tree will be sent for scanning and will be in EPIC under the Media tab.  Significant diagnoses include the following:  Family History  Problem Relation Age of Onset  . Ovarian cancer Mother 11    also uterine sarcoma  . Other Father   . Breast cancer Maternal Uncle     female breast cancer; deceased 68  . Breast cancer Cousin 110   Currently 32    Additionally, Shelly Simpson has two daughters (ages 19 and 53). She has no full siblings. She has a maternal half-brother (age 63) and maternal half-sister (age 39). Her mother told her late in life that she does not have the same father as her siblings. For this reason, she does not know any information about biological relatives.  Shelly Simpson ancestry is African American. There is no known Jewish ancestry and no consanguinity.  ASSESSMENT AND PLAN: Shelly Simpson is a 68 y.o. female with a personal history of breast cancer and family history of ovarian cancer and female breast cancer. This history is highly suggestive of a hereditary predisposition to cancer, specifically BRCA1 or BRCA2. Given her mother's history of uterine sarcoma, an extended panel is indicated. We reviewed the characteristics, features and inheritance patterns of hereditary cancer syndromes. We also discussed genetic testing, including the other appropriate family members to test (her affected maternal cousin), the process of testing, insurance coverage and implications of results. A negative test will need to be interpreted with caution.  Shelly Simpson wished to pursue genetic testing and a blood sample will be sent to [facility] for analysis of the 24 genes on the OvaNext gene panel. We discussed the implications of a positive, negative and/ or Variant of Uncertain Significance (VUS)  result. Results should be available in approximately 4-5 weeks, at which point we will contact her and address implications for her as well as address genetic testing for at-risk family members, if needed.    We encouraged Shelly Simpson to remain in contact with Cancer Genetics annually so that we can update the family history and inform her of any changes in cancer genetics and testing that may be of benefit for this family. Ms.  Simpson questions were answered to her satisfaction today.   Thank you for the referral and allowing [place] to share  in the care of your patient.   The patient was seen for a total of 45 minutes, greater than 50% of which was spent face-to-face counseling. This patient was discussed with the overseeing provider who agrees with the above.   Steele Berg, MS, [place] Certified Genetic Counseor phone: [phone] [email or web address]

## 2014-06-15 ENCOUNTER — Other Ambulatory Visit: Payer: Self-pay | Admitting: Oncology

## 2014-06-15 ENCOUNTER — Telehealth: Payer: Self-pay | Admitting: *Deleted

## 2014-06-15 NOTE — Telephone Encounter (Signed)
Patient called with question regarding whether she should have an MRI versus a mammogram. Spoke with Dr. Jana Hakim. Patient informed that she would need to have mammogram done before receiving an MRI. Patient verbalized understanding.

## 2014-06-16 ENCOUNTER — Encounter: Payer: Self-pay | Admitting: Genetic Counselor

## 2014-06-16 NOTE — Progress Notes (Signed)
GENETIC TEST RESULTS  Patient Name: BLUE RUGGERIO Patient Age: 68 y.o. Encounter Date: 06/16/2014  Referring Physician: Lurline Del, MD   Ms. Guida was called today to discuss genetic test results. Please see the Genetics note from her visit on 05/28/2014 for a detailed discussion of her personal and family history.  GENETIC TESTING: At the time of Ms. Marcantonio's visit, we recommended she pursue genetic testing of multiple genes on the OvaNext gene panel. This test, which included sequencing and deletion/duplication analysis of 24 genes, was performed at [facility]. Testing was normal and did not reveal a mutation in these genes. The genes tested were ATM, BARD1, BRCA1, BRCA2, BRIP1, CDH1, CHEK2, EPCAM, MLH1, MRE11A, MSH2, MSH6, MUTYH, NBN, NF1, PALB2, PMS2, PTEN, RAD50, RAD51C, RAD51D, SMARCA4, STK11, and TP53.  We discussed with Ms. Divirgilio that since the current test is not perfect, it is possible there may be a gene mutation that current testing cannot detect, but that chance is small. We also discussed that it is possible that a different genetic factor, which was not part of this testing or has not yet been discovered, is responsible for the cancer diagnoses in the family. It is very likely that there is a detectable pathogenic mutation in her maternal family, but that Ms. Pevey did not inherit it. If that is the case, her breast cancer is termed a phenocopy. Given her age at diagnosis, this is likely the case.  CANCER SCREENING: Given the personal and family histories, we must interpret these negative results with some caution. Families with features suggestive of hereditary risk tend to have multiple family members with cancer, diagnoses in multiple generations and diagnoses before the age of 23. Ms. Rodden's family exhibits some of these features. As discussed above, it may be that Ms. Mcquade's breast cancer at age 82 is sporadic, which the other cancers in her family are due  to a detectable mutation. We recommend she continue undergoing the screening recommended by her providers.  FAMILY MEMBERS: While these results are likely reassuring for Ms. Caudell, this test does not tell us anything about Ms. Lopezmartinez's maternal family. Because of the history of breast and ovarian cancer, including female breast cancer, we recommend her maternal cousin who had breast cancer pursue a genetics evaluation. Please let us know if we can help facilitate testing. Genetic counselors can be located in other cities, by visiting the website of the [employer] (ArtistMovie.se) and Field seismologist for a Dietitian by zip code.    Lastly, we discussed with Ms. Schutt that cancer genetics is a rapidly advancing field and it is possible that new genetic tests will be appropriate for her in the future. We encouraged her to remain in contact with [place] on an annual basis so we can update her personal and family histories, and let her know of advances in cancer genetics that may benefit the family. Our contact number was provided. Ms. Dupas questions were answered to her satisfaction today, and she knows she is welcome to call anytime with additional questions.    Steele Berg, MS, [place] Certified Genetic Counseor phone: [phone] [email or web address]

## 2014-06-18 ENCOUNTER — Other Ambulatory Visit: Payer: Self-pay | Admitting: *Deleted

## 2014-06-18 ENCOUNTER — Other Ambulatory Visit (HOSPITAL_BASED_OUTPATIENT_CLINIC_OR_DEPARTMENT_OTHER): Payer: Managed Care, Other (non HMO)

## 2014-06-18 ENCOUNTER — Ambulatory Visit
Admission: RE | Admit: 2014-06-18 | Discharge: 2014-06-18 | Disposition: A | Discharge status: Home / Self Care | Payer: Managed Care, Other (non HMO) | Source: Ambulatory Visit | Attending: Oncology | Admitting: Oncology

## 2014-06-18 DIAGNOSIS — Z9889 Other specified postprocedural states: Secondary | ICD-10-CM

## 2014-06-18 DIAGNOSIS — C50919 Malignant neoplasm of unspecified site of unspecified female breast: Secondary | ICD-10-CM

## 2014-06-18 DIAGNOSIS — Z853 Personal history of malignant neoplasm of breast: Secondary | ICD-10-CM

## 2014-06-18 DIAGNOSIS — C50111 Malignant neoplasm of central portion of right female breast: Secondary | ICD-10-CM

## 2014-06-18 LAB — CBC WITH DIFFERENTIAL/PLATELET
BASO%: 1.2 % (ref 0.0–2.0)
Basophils Absolute: 0.1 10*3/uL (ref 0.0–0.1)
EOS%: 1.3 % (ref 0.0–7.0)
Eosinophils Absolute: 0.1 10*3/uL (ref 0.0–0.5)
HCT: 43.3 % (ref 34.8–46.6)
HGB: 14 g/dL (ref 11.6–15.9)
LYMPH#: 1.9 10*3/uL (ref 0.9–3.3)
LYMPH%: 25.4 % (ref 14.0–49.7)
MCH: 30.3 pg (ref 25.1–34.0)
MCHC: 32.3 g/dL (ref 31.5–36.0)
MCV: 93.7 fL (ref 79.5–101.0)
MONO#: 0.5 10*3/uL (ref 0.1–0.9)
MONO%: 7 % (ref 0.0–14.0)
NEUT#: 4.8 10*3/uL (ref 1.5–6.5)
NEUT%: 65.1 % (ref 38.4–76.8)
PLATELETS: 258 10*3/uL (ref 145–400)
RBC: 4.62 10*6/uL (ref 3.70–5.45)
RDW: 12.4 % (ref 11.2–14.5)
WBC: 7.3 10*3/uL (ref 3.9–10.3)

## 2014-06-18 LAB — COMPREHENSIVE METABOLIC PANEL (CC13)
ALBUMIN: 3.9 g/dL (ref 3.5–5.0)
ALT: 20 U/L (ref 0–55)
ANION GAP: 11 meq/L (ref 3–11)
AST: 13 U/L (ref 5–34)
Alkaline Phosphatase: 82 U/L (ref 40–150)
BILIRUBIN TOTAL: 0.4 mg/dL (ref 0.20–1.20)
BUN: 15.7 mg/dL (ref 7.0–26.0)
CALCIUM: 10.1 mg/dL (ref 8.4–10.4)
CHLORIDE: 106 meq/L (ref 98–109)
CO2: 25 mEq/L (ref 22–29)
Creatinine: 0.9 mg/dL (ref 0.6–1.1)
GLUCOSE: 135 mg/dL (ref 70–140)
POTASSIUM: 4.1 meq/L (ref 3.5–5.1)
SODIUM: 142 meq/L (ref 136–145)
Total Protein: 6.8 g/dL (ref 6.4–8.3)

## 2014-06-25 ENCOUNTER — Telehealth: Payer: Self-pay | Admitting: Oncology

## 2014-06-25 ENCOUNTER — Ambulatory Visit (HOSPITAL_BASED_OUTPATIENT_CLINIC_OR_DEPARTMENT_OTHER): Payer: Managed Care, Other (non HMO) | Admitting: Oncology

## 2014-06-25 VITALS — BP 138/61 | HR 97 | Temp 98.7°F | Resp 18 | Ht 65.0 in | Wt 240.6 lb

## 2014-06-25 DIAGNOSIS — C50811 Malignant neoplasm of overlapping sites of right female breast: Secondary | ICD-10-CM | POA: Insufficient documentation

## 2014-06-25 DIAGNOSIS — Z170 Estrogen receptor positive status [ER+]: Secondary | ICD-10-CM | POA: Insufficient documentation

## 2014-06-25 DIAGNOSIS — C50911 Malignant neoplasm of unspecified site of right female breast: Secondary | ICD-10-CM

## 2014-06-25 DIAGNOSIS — Z853 Personal history of malignant neoplasm of breast: Secondary | ICD-10-CM

## 2014-06-25 NOTE — Telephone Encounter (Signed)
lvm for pt with Dr. Helane Rima info for pt to call and sched appts

## 2014-06-25 NOTE — Progress Notes (Signed)
ID: Shelly Simpson   DOB: 1946/03/27  MR#: [id number]  CSN#:[id number]  PCP: Shelly Spurling, MD GYN: Shelly Simpson SU: Shelly Simpson OTHER MD: Shelly Simpson   BREAST CANCER HISTORY:  From Dr. Collier Salina Simpson's noted 04/29/2008:  She underwent a screening mammogram in January.  She has had previous six monthly screening mammogram for evaluation of benign appearing calcifications.  She subsequently had a six-month followup mammogram in July 16th, 2009.  This showed indeterminate calcifications in the retroareolar right breast.  Biopsy was recommended.  Invasive ductal cancer associated with DCIS was noted.  The invasive component was ER and PR positive at 99% and 51% respectively, HER-2 was 2+, and proliferative index was 10 %.  The patient had an MRI scan on March 19, 2008.  This showed a 1 x 1 x 0.8 cm area of DCIS with small amount of invasive cancer 6 o'clock position.  The other exam is otherwise unremarkable.  The patient elected to undergo a lumpectomy and sentinel lymph node evaluation on 04/08/2008.  Final pathology showed invasive ductal cancer grade 2 of 3 measuring 1.2 cm.  The invasive component was less than 0.1 cm to the deep margin.  Additional margin was removed, which showed invasive cancer with DCIS focally less than 0.1 cm with a new margin.  Two sentinel lymph nodes were identified, one sentinel lymph node had micro metastases noted.  Of note is that the final FISH testing was performed on the primary tumor, which was shown to be positive for amplification in the ratio of 2.45  Subsequent history is as detailed below.  INTERVAL HISTORY: Shelly Simpson returns today for followup of her breast cancer. Since her last visit here she completed her genetic testing. Thankfully this shows no actionable mutation.   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 A detailed review of systems today was noncontributory  PAST  MEDICAL HISTORY: Past Medical History  Diagnosis Date  . History of breast cancer   . Family history of ovarian cancer   . Family history of breast cancer in female    Seizures, hypertension, hypercholesterolemia  PAST SURGICAL HISTORY: Past Surgical History  Procedure Laterality Date  . Breast surgery    . Abdominal hysterectomy    . Joint replacement     Status post hysterectomy with bilateral stalpingo- oophorectomy, status post laminectomy, status post tonsillectomy and adenoidectomy, status post knee surgery  FAMILY HISTORY Family History  Problem Relation Age of Onset  . Ovarian cancer Mother 34    also uterine sarcoma  . Other Father   . Breast cancer Maternal Uncle     female breast cancer; deceased 6  . Breast cancer Cousin 48    Currently 16   The patient's father died in his 44s from congestive heart failure and what sounds like a pulmonary embolus. The patient's mother died at the age of 73, from ovarian cancer, which was diagnosed shortly before her birth. Also the patient's mother is brother had a diagnosis of breast cancer, late in life.   GYNECOLOGIC HISTORY: Menarche age 9, first live birth age 65, she is GX P2, undergoing hysterectomy in 56. She took hormone replacement for approximately 16 years.  SOCIAL HISTORY: She used to work for [employer]. Her husband Shelly Simpson is also retired. Daughter Shelly Simpson works in [place] as a Audiological scientist; daughter Shelly Simpson teaches biology in college; she has a PhD degree. The patient  has 1 grandchild.   ADVANCED DIRECTIVES: Not in place  HEALTH MAINTENANCE: History  Substance Use Topics  . Smoking status: Never Smoker   . Smokeless tobacco: Never Used  . Alcohol Use: No     Colonoscopy:  PAP:  Bone density:  Lipid panel:  Allergies  Allergen Reactions  . Iohexol      Desc: pt states in '80's wheezing; dyspnea w/ contrast--pt needs pre meds in future; slg 04/24/2008   . Sulfa Antibiotics      Current Outpatient Prescriptions  Medication Sig Dispense Refill  . albuterol (PROAIR HFA) 108 (90 BASE) MCG/ACT inhaler Inhale 2 puffs into the lungs every 6 (six) hours as needed.  3 Inhaler  0  . amoxicillin-clavulanate (AUGMENTIN) 875-125 MG per tablet Take 1 tablet by mouth 2 (two) times daily as needed.      Marland Kitchen anastrozole (ARIMIDEX) 1 MG tablet TAKE 1 TABLET DAILY.  90 tablet  1  . calcium citrate-vitamin D (CITRACAL+D) 315-200 MG-UNIT per tablet Take 1 tablet by mouth 2 (two) times daily.      . cetirizine (ZYRTEC) 10 MG tablet Take 10 mg by mouth daily as needed.       . cloNIDine (CATAPRES) 0.1 MG tablet Take 0.1 mg by mouth daily.       . fluticasone (FLONASE) 50 MCG/ACT nasal spray Place 2 sprays into the nose daily as needed.       . Multiple Vitamin (MULTIVITAMIN) capsule Take 1 capsule by mouth daily.      Marland Kitchen olmesartan-hydrochlorothiazide (BENICAR HCT) 20-12.5 MG per tablet Take 1 tablet by mouth daily.      . phenytoin (DILANTIN) 100 MG ER capsule Take by mouth 3 (three) times daily.       No current facility-administered medications for this visit.    OBJECTIVE: Middle-aged woman in no acute distress Filed Vitals:   06/25/14 1342  BP: 138/61  Pulse: 97  Temp: 98.7 F (37.1 C)  Resp: 18     Body mass index is 40.04 kg/(m^2).    ECOG FS: 0  Sclerae unicteric, pupils round and equal Oropharynx clear, slightly dry No cervical or supraclavicular adenopathy Lungs no rales or rhonchi Heart regular rate and rhythm Abd soft, obese, nontender, positive bowel sounds MSK no focal spinal tenderness, no upper extremity lymphedema Neuro: nonfocal, well oriented, positive affect Breasts: Deferred  LAB RESULTS: Lab Results  Component Value Date   WBC 7.3 06/18/2014   NEUTROABS 4.8 06/18/2014   HGB 14.0 06/18/2014   HCT 43.3 06/18/2014   MCV 93.7 06/18/2014   PLT 258 06/18/2014      Chemistry      Component Value Date/Time   NA 142 06/18/2014 0928   NA 139  12/11/2011 0922   K 4.1 06/18/2014 0928   K 3.8 12/11/2011 0922   CL 103 12/11/2011 0922   CO2 25 06/18/2014 0928   CO2 24 12/11/2011 0922   BUN 15.7 06/18/2014 0928   BUN 17 12/11/2011 0922   CREATININE 0.9 06/18/2014 0928   CREATININE 0.79 12/11/2011 0922      Component Value Date/Time   CALCIUM 10.1 06/18/2014 0928   CALCIUM 9.8 12/11/2011 0922   ALKPHOS 82 06/18/2014 0928   ALKPHOS 73 12/11/2011 0922   AST 13 06/18/2014 0928   AST 15 12/11/2011 0922   ALT 20 06/18/2014 0928   ALT 13 12/11/2011 0922   BILITOT 0.40 06/18/2014 0928   BILITOT 0.3 12/11/2011 0922       Lab  Results  Component Value Date   LABCA2 32 05/24/2011    No components found with this basename: [id number]    No results found for this basename: INR,  in the last 168 hours  Urinalysis    Component Value Date/Time   COLORURINE YELLOW 05/20/2008 [place] 05/20/2008 1548   LABSPEC 1.008 05/20/2008 1548   PHURINE 6.5 05/20/2008 1548   GLUCOSEU NEGATIVE 05/20/2008 [place] 05/20/2008 [place] 05/20/2008 [place] 05/20/2008 1548   PROTEINUR NEGATIVE 05/20/2008 1548   UROBILINOGEN 0.2 05/20/2008 1548   NITRITE NEGATIVE 05/20/2008 1548   LEUKOCYTESUR NEGATIVE MICROSCOPIC NOT DONE ON URINES WITH NEGATIVE PROTEIN, BLOOD, LEUKOCYTES, NITRITE, OR GLUCOSE <1000 mg/dL. 05/20/2008 1548    STUDIES: DIGITAL DIAGNOSTIC RIGHT MAMMOGRAM WITH CAD  COMPARISON: May 09, 2013, May 08, 2012, August 20 to  2012  ACR Breast Density Category b: There are scattered areas of  fibroglandular density.  FINDINGS:  Cc and MLO views of the right breast, spot magnification CC and  lateral views of the right breast are submitted. Stable postsurgical  scar is unchanged. Coarse benign appearing calcifications are  identified within the right breast. There are no suspicious  microcalcifications.  Mammographic images were processed with CAD.  IMPRESSION:  Benign  findings.  RECOMMENDATION:  Bilateral diagnostic mammogram back on schedule.  I have discussed the findings and recommendations with the patient.  Results were also provided in writing at the conclusion of the  visit. If applicable, a reminder letter will be sent to the patient  regarding the next appointment.  BI-RADS CATEGORY 2: Benign Finding(s)  Electronically Signed  By: Abelardo Diesel M.D.  On: 11/10/2013 10:48   ASSESSMENT: 68 y.o.  woman status post right lumpectomy and sentinel lymph node sampling 04/08/2008 for apT1c pN1, stage IIA invasive ductal carcinoma, grade 2, estrogen receptor 99% and progesterone receptor 51% positive, with an MIB-1 of 10%, and HER-2 amplification by CISH, with a ratio of 2.45.  (1) treated adjuvantly with carboplatin and Taxotere x2 cycles, poorly tolerated, followed by 4 cycles of weekly carboplatin Abraxane completed 07/21/2008  (2) adjuvant radiation therapy to the right breast completed 10/30/2008  (3) started on letrozole March of 2010, switched to anastrozole February of 2011 because of arthralgias/myalgias  (4) genetic testing sent 05/28/2014-- the following genes showed no abnormality: ATM, BARD1, BRCA1, BRCA2, BRIP1, CDH1, CHEK2, EPCAM, MLH1, MRE11A, MSH2, MSH6, MUTYH, NBN, NF1, PALB2, PMS2, PTEN, RAD50, RAD51C, RAD51D, SMARCA4, STK11, and TP53.  (5) the patient is status post TAH/BSO remotely  PLAN:  Ms. Seawright turns out not to have a demonstrable genetic abnormality. This is very favorable for her. We did discuss her genetics background in detail and she is going to do some gynecologic research. She will let me know if that proves to be successful.  At this point I am comfortable releasing her to her primary care physician. She will need yearly physician breast exam and yearly mammography. The patient is also interested in resuming gynecologic follow-up, and we will place a referral for her to become established with a new  gynecologist, now that looked Dr. Ubaldo Glassing has retired  I will be glad to see Ms. Bunnell at any point in the future if the need arises. As of now however we're making no further routine appointment for her here.  MAGRINAT,GUSTAV C    06/25/2014

## 2014-07-04 ENCOUNTER — Ambulatory Visit (INDEPENDENT_AMBULATORY_CARE_PROVIDER_SITE_OTHER): Payer: Managed Care, Other (non HMO) | Admitting: Emergency Medicine

## 2014-07-04 VITALS — BP 106/70 | HR 86 | Temp 98.9°F | Resp 16 | Ht 66.0 in | Wt 238.0 lb

## 2014-07-04 DIAGNOSIS — J209 Acute bronchitis, unspecified: Secondary | ICD-10-CM

## 2014-07-04 DIAGNOSIS — J0100 Acute maxillary sinusitis, unspecified: Secondary | ICD-10-CM

## 2014-07-04 MED ORDER — AMOXICILLIN-POT CLAVULANATE 875-125 MG PO TABS: 1.0000 | ORAL_TABLET | Freq: Two times a day (BID) | ORAL | Status: DC

## 2014-07-04 MED ORDER — BENZONATATE 200 MG PO CAPS: 200.0000 mg | ORAL_CAPSULE | Freq: Two times a day (BID) | ORAL | Status: DC | PRN

## 2014-07-04 MED ORDER — PSEUDOEPHEDRINE-GUAIFENESIN ER 60-600 MG PO TB12: 1.0000 | ORAL_TABLET | Freq: Two times a day (BID) | ORAL | Status: AC

## 2014-07-04 NOTE — Patient Instructions (Signed)

## 2014-07-04 NOTE — Progress Notes (Signed)
[facility] [address] [place] [address] [phone]  Date:  07/04/2014   Name:  Shelly Simpson   DOB:  [birth year]   MRN:  [id number]  PCP:  Foye Spurling, MD    Chief Complaint: Cough and Sinus Problem   History of Present Illness:  Shelly Simpson is a 68 y.o. very pleasant female patient who presents with the following:  Patient ill for over a week with nasal congestion and purulent drainage.  Has sore throat Pressure in maxillary sinuses.  Post nasal drainage Cough productive of purulent sputum.  No wheezing or shortness of breath No nausea or vomiting no stool change Chilled but no fever measured. No improvement with over the counter medications or other home remedies.  Denies other complaint or health concern today.   Patient Active Problem List   Diagnosis Date Noted  . Breast cancer, right breast 06/25/2014  . History of breast cancer   . Family history of ovarian cancer   . Family history of breast cancer in female     Past Medical History  Diagnosis Date  . History of breast cancer   . Family history of ovarian cancer   . Family history of breast cancer in female   . Cancer   . Hypertension     Past Surgical History  Procedure Laterality Date  . Breast surgery    . Abdominal hysterectomy    . Joint replacement      History  Substance Use Topics  . Smoking status: Never Smoker   . Smokeless tobacco: Never Used  . Alcohol Use: No    Family History  Problem Relation Age of Onset  . Ovarian cancer Mother 37    also uterine sarcoma  . Other Father   . Breast cancer Maternal Uncle     female breast cancer; deceased 28  . Breast cancer Cousin 41    Currently 66    Allergies  Allergen Reactions  . Iohexol      Desc: pt states in '80's wheezing; dyspnea w/ contrast--pt needs pre meds in future; slg 04/24/2008   . Sulfa Antibiotics     Medication list has been reviewed and updated.  Current Outpatient Prescriptions  on File Prior to Visit  Medication Sig Dispense Refill  . anastrozole (ARIMIDEX) 1 MG tablet TAKE 1 TABLET DAILY. 90 tablet 1  . calcium citrate-vitamin D (CITRACAL+D) 315-200 MG-UNIT per tablet Take 1 tablet by mouth 2 (two) times daily.    . cetirizine (ZYRTEC) 10 MG tablet Take 10 mg by mouth daily as needed.     . fluticasone (FLONASE) 50 MCG/ACT nasal spray Place 2 sprays into the nose daily as needed.     . Multiple Vitamin (MULTIVITAMIN) capsule Take 1 capsule by mouth daily.    Marland Kitchen olmesartan-hydrochlorothiazide (BENICAR HCT) 20-12.5 MG per tablet Take 1 tablet by mouth daily.    . phenytoin (DILANTIN) 100 MG ER capsule Take by mouth 3 (three) times daily.    Marland Kitchen albuterol (PROAIR HFA) 108 (90 BASE) MCG/ACT inhaler Inhale 2 puffs into the lungs every 6 (six) hours as needed. 3 Inhaler 0  . cloNIDine (CATAPRES) 0.1 MG tablet Take 0.1 mg by mouth daily.      No current facility-administered medications on file prior to visit.    Review of Systems:   As per HPI, otherwise negative.    Physical Examination: Filed Vitals:   07/04/14 1250  BP: 106/70  Pulse: 86  Temp: 98.9 F (37.2 C)  Resp: 16   Filed Vitals:   07/04/14 1250  Height: 5\' 6"  (1.676 m)  Weight: 238 lb (107.956 kg)   Body mass index is 38.43 kg/(m^2). Ideal Body Weight: Weight in (lb) to have BMI = 25: 154.6  GEN: WDWN, NAD, Non-toxic, A & O x 3  Persistent cough HEENT: Atraumatic, Normocephalic. Neck supple. No masses, No LAD. Ears and Nose: No external deformity. CV: RRR, No M/G/R. No JVD. No thrill. No extra heart sounds. PULM: CTA B, no wheezes, crackles, rhonchi. No retractions. No resp. distress. No accessory muscle use. ABD: S, NT, ND, +BS. No rebound. No HSM. EXTR: No c/c/e NEURO Normal gait.  PSYCH: Normally interactive. Conversant. Not depressed or anxious appearing.  Calm demeanor.    Assessment and Plan: Acute sinusitis Acute bronchitis Tessalon augmentin  Signed,  Ellison Carwin,  MD

## 2015-06-15 ENCOUNTER — Other Ambulatory Visit: Payer: Self-pay | Admitting: Oncology

## 2015-06-15 DIAGNOSIS — Z853 Personal history of malignant neoplasm of breast: Secondary | ICD-10-CM

## 2015-06-29 ENCOUNTER — Ambulatory Visit
Admission: RE | Admit: 2015-06-29 | Discharge: 2015-06-29 | Disposition: A | Discharge status: Home / Self Care | Payer: BLUE CROSS/BLUE SHIELD | Source: Ambulatory Visit | Attending: Oncology | Admitting: Oncology

## 2015-06-29 DIAGNOSIS — Z853 Personal history of malignant neoplasm of breast: Secondary | ICD-10-CM

## 2015-08-04 ENCOUNTER — Telehealth: Payer: Self-pay | Admitting: *Deleted

## 2015-08-04 NOTE — Telephone Encounter (Signed)
Voicemail: "I had lumpectomy for cancer to right breast.  3D mammogram performed.  For the past month I have soreness in my breast and a knot the size of a small green pea.  I called the [facility],  the report didn't find anything and was told to cal oncologist to get advice.  The soreness didn't appear until after 3D mammogram.  Return number [id number]."    Last seen 06-25-2014 No further F/U at this time.

## 2015-08-05 NOTE — Telephone Encounter (Signed)
Per Dr. Jana Hakim patient can stop in at 3pm and ask for RN.  He will see her with Val.  Patient called and is aware.

## 2015-08-06 ENCOUNTER — Other Ambulatory Visit: Payer: Self-pay | Admitting: Oncology

## 2015-08-06 ENCOUNTER — Telehealth: Payer: Self-pay | Admitting: Oncology

## 2015-08-06 DIAGNOSIS — C50011 Malignant neoplasm of nipple and areola, right female breast: Secondary | ICD-10-CM

## 2015-08-06 NOTE — Telephone Encounter (Signed)
Patient aware of breast center appointment and a calendar has been printed

## 2015-08-11 ENCOUNTER — Other Ambulatory Visit: Payer: Self-pay | Admitting: *Deleted

## 2015-08-11 DIAGNOSIS — Z853 Personal history of malignant neoplasm of breast: Secondary | ICD-10-CM

## 2015-08-11 DIAGNOSIS — C50911 Malignant neoplasm of unspecified site of right female breast: Secondary | ICD-10-CM

## 2015-08-12 ENCOUNTER — Telehealth: Payer: Self-pay | Admitting: *Deleted

## 2015-08-12 ENCOUNTER — Ambulatory Visit
Admission: RE | Admit: 2015-08-12 | Discharge: 2015-08-12 | Disposition: A | Discharge status: Home / Self Care | Payer: BLUE CROSS/BLUE SHIELD | Source: Ambulatory Visit | Attending: Oncology | Admitting: Oncology

## 2015-08-12 DIAGNOSIS — C50011 Malignant neoplasm of nipple and areola, right female breast: Secondary | ICD-10-CM

## 2015-08-12 NOTE — Telephone Encounter (Signed)
The [facility] called requesting orders for today's mammogram.  Need orders signed via EPIC or fax form that was faxed yesterday by 11:00 am today or test will be cancelled.

## 2015-08-12 NOTE — Telephone Encounter (Signed)
Request faxed this am

## 2015-08-13 ENCOUNTER — Other Ambulatory Visit: Payer: Self-pay | Admitting: *Deleted

## 2015-08-13 NOTE — Progress Notes (Signed)
This RN spoke with pt per mammo and US obtained yesterday due to palpable nodule.  Reading shows " benign dystrophic calcification "- result discussed with pt as well as recommendation per MD to be seen in January 2017 for further evaluation and appropriate follow up plan.  Pt appreciated above due to concerns of recurrence.  Per call pt understands to call if changes or new areas of concern occur.

## 2015-08-16 ENCOUNTER — Telehealth: Payer: Self-pay | Admitting: Nurse Practitioner

## 2015-08-16 NOTE — Telephone Encounter (Signed)
Called and left a message with new appointment  °

## 2015-09-15 ENCOUNTER — Other Ambulatory Visit: Payer: Self-pay | Admitting: Oncology

## 2015-09-15 ENCOUNTER — Telehealth: Payer: Self-pay | Admitting: *Deleted

## 2015-09-15 DIAGNOSIS — C50211 Malignant neoplasm of upper-inner quadrant of right female breast: Secondary | ICD-10-CM

## 2015-09-15 NOTE — Telephone Encounter (Signed)
Pt states MD doing breast US was able to feel knot in breast, but did not show up on [place] or mammogram. MD states she needs to have MRI, but that would need to be ordered by Dr Jana Hakim. Pt has F/U appt with Gentry Fitz, NP on 1/30 and would like to have MRI ASAP.   Please advise

## 2015-09-15 NOTE — Progress Notes (Unsigned)
Ms. Bankes called a very concerned that she might be having a recurrence. She demanded an MRI. I don't think this will be approved. She is going to see me later this month and we will review her films in [place] at that point.

## 2015-09-16 ENCOUNTER — Telehealth: Payer: Self-pay | Admitting: Oncology

## 2015-09-16 ENCOUNTER — Other Ambulatory Visit: Payer: Self-pay | Admitting: Oncology

## 2015-09-16 NOTE — Telephone Encounter (Signed)
Spoke with patient and she is aware of her new dates and time

## 2015-09-24 ENCOUNTER — Ambulatory Visit (HOSPITAL_BASED_OUTPATIENT_CLINIC_OR_DEPARTMENT_OTHER): Payer: BLUE CROSS/BLUE SHIELD | Admitting: Oncology

## 2015-09-24 ENCOUNTER — Other Ambulatory Visit: Payer: Self-pay | Admitting: *Deleted

## 2015-09-24 ENCOUNTER — Other Ambulatory Visit (HOSPITAL_BASED_OUTPATIENT_CLINIC_OR_DEPARTMENT_OTHER): Payer: BLUE CROSS/BLUE SHIELD

## 2015-09-24 VITALS — BP 124/62 | HR 80 | Temp 98.3°F | Resp 18 | Ht 66.0 in | Wt 246.1 lb

## 2015-09-24 DIAGNOSIS — C50811 Malignant neoplasm of overlapping sites of right female breast: Secondary | ICD-10-CM | POA: Diagnosis not present

## 2015-09-24 DIAGNOSIS — Z170 Estrogen receptor positive status [ER+]: Secondary | ICD-10-CM | POA: Diagnosis not present

## 2015-09-24 DIAGNOSIS — C50211 Malignant neoplasm of upper-inner quadrant of right female breast: Secondary | ICD-10-CM

## 2015-09-24 LAB — CBC WITH DIFFERENTIAL/PLATELET
BASO%: 1.3 % (ref 0.0–2.0)
Basophils Absolute: 0.1 10*3/uL (ref 0.0–0.1)
EOS ABS: 0.1 10*3/uL (ref 0.0–0.5)
EOS%: 1.3 % (ref 0.0–7.0)
HCT: 44.8 % (ref 34.8–46.6)
HEMOGLOBIN: 14.6 g/dL (ref 11.6–15.9)
LYMPH%: 31.1 % (ref 14.0–49.7)
MCH: 30.4 pg (ref 25.1–34.0)
MCHC: 32.5 g/dL (ref 31.5–36.0)
MCV: 93.5 fL (ref 79.5–101.0)
MONO#: 0.7 10*3/uL (ref 0.1–0.9)
MONO%: 7.8 % (ref 0.0–14.0)
NEUT%: 58.5 % (ref 38.4–76.8)
NEUTROS ABS: 5.2 10*3/uL (ref 1.5–6.5)
PLATELETS: 263 10*3/uL (ref 145–400)
RBC: 4.8 10*6/uL (ref 3.70–5.45)
RDW: 12.2 % (ref 11.2–14.5)
WBC: 8.8 10*3/uL (ref 3.9–10.3)
lymph#: 2.7 10*3/uL (ref 0.9–3.3)

## 2015-09-24 LAB — COMPREHENSIVE METABOLIC PANEL
ALBUMIN: 4.1 g/dL (ref 3.5–5.0)
ALK PHOS: 85 U/L (ref 40–150)
ALT: 24 U/L (ref 0–55)
ANION GAP: 11 meq/L (ref 3–11)
AST: 16 U/L (ref 5–34)
BUN: 15.2 mg/dL (ref 7.0–26.0)
CALCIUM: 9.6 mg/dL (ref 8.4–10.4)
CO2: 25 mEq/L (ref 22–29)
Chloride: 105 mEq/L (ref 98–109)
Creatinine: 0.9 mg/dL (ref 0.6–1.1)
EGFR: 74 mL/min/{1.73_m2} — AB (ref >90)
Glucose: 129 mg/dl (ref 70–140)
POTASSIUM: 4 meq/L (ref 3.5–5.1)
Sodium: 141 mEq/L (ref 136–145)
Total Bilirubin: 0.3 mg/dL (ref 0.20–1.20)
Total Protein: 7.4 g/dL (ref 6.4–8.3)

## 2015-09-24 NOTE — Progress Notes (Signed)
ID: Shelly Simpson   DOB: Oct 29, 1945  MR#: [id number]  CSN#:[id number]  PCP: Foye Spurling, MD GYN:  [facility] OTHER MD: Kyung Rudd   BREAST CANCER HISTORY:  From Dr. Collier Salina Rubin's noted 04/29/2008:  She underwent a screening mammogram in January.  She has had previous six monthly screening mammogram for evaluation of benign appearing calcifications.  She subsequently had a six-month followup mammogram in July 16th, 2009.  This showed indeterminate calcifications in the retroareolar right breast.  Biopsy was recommended.  Invasive ductal cancer associated with DCIS was noted.  The invasive component was ER and PR positive at 99% and 51% respectively, HER-2 was 2+, and proliferative index was 10 %.  The patient had an MRI scan on March 19, 2008.  This showed a 1 x 1 x 0.8 cm area of DCIS with small amount of invasive cancer 6 o'clock position.  The other exam is otherwise unremarkable.  The patient elected to undergo a lumpectomy and sentinel lymph node evaluation on 04/08/2008.  Final pathology showed invasive ductal cancer grade 2 of 3 measuring 1.2 cm.  The invasive component was less than 0.1 cm to the deep margin.  Additional margin was removed, which showed invasive cancer with DCIS focally less than 0.1 cm with a new margin.  Two sentinel lymph nodes were identified, one sentinel lymph node had micro metastases noted.  Of note is that the final FISH testing was performed on the primary tumor, which was shown to be positive for amplification in the ratio of 2.45  Subsequent history is as detailed below.  INTERVAL HISTORY: Shelly Simpson returns today for followup of her estrogen receptor positive breast cancer. After she saw me in October 2015, we released her from follow-up and she went off anastrozole. She did not notice any significant changes since going off the anastrozole and in particular hot flashes, vaginal dryness, and fatigue, which were not prominent problems before, were no  different afterwards.  More recently she noted a palpable mass in her right breast, which is the surgical breast. She had had mammography with bilateral tomography 06/29/2015 and this was unremarkable. The breast density was category C. This of course reduces mammographic sensitivity. In the early part of December 2016 she noted a palpable area along her left lumpectomy scar which she had not felt previously. Accordingly she was set up for repeat right mammography with tomosynthesis and right breast ultrasonography. This area shows architectural distortion and dystrophic calcifications which appeared stable. There had been a little bit of progression of the dystrophic calcifications medial to the lumpectomy site which is where the patient has the palpable abnormality. However there are no discrete masses or any other areas of architectural or distortion. On exam there was a small superficial nodule along the lumpectomy scar medial to where the patient's nipple would have been. Ultrasonography found a focal area of increased echogenicity in the superficial right breast corresponding to the palpable abnormality. This seemed to be calcification in the interface of the dermis and underlying subcutaneous tissue, with no associated mass.  The mammographer states that he discussed the findings and recommendations with the patient. The patient says that no one discussed these findings with her. She simply received a letter suggesting that she return in one year. She remains alarmed, and wanted to come and discuss this further with [place].  REVIEW OF SYSTEMS: Shelly Simpson is not currently exercising regularly. Aside from mild sinus symptoms, a detailed review of systems today was otherwise entirely negative  PAST MEDICAL HISTORY: Past Medical History  Diagnosis Date  . History of breast cancer   . Family history of ovarian cancer   . Family history of breast cancer in female   . Cancer   . Hypertension    Seizures,  hypertension, hypercholesterolemia  PAST SURGICAL HISTORY: Past Surgical History  Procedure Laterality Date  . Breast surgery    . Abdominal hysterectomy    . Joint replacement     Status post hysterectomy with bilateral stalpingo- oophorectomy, status post laminectomy, status post tonsillectomy and adenoidectomy, status post knee surgery  FAMILY HISTORY Family History  Problem Relation Age of Onset  . Ovarian cancer Mother 38    also uterine sarcoma  . Other Father   . Breast cancer Maternal Uncle     female breast cancer; deceased 26  . Breast cancer Cousin 41    Currently 27   The patient's father died in his 42s from congestive heart failure and what sounds like a pulmonary embolus. The patient's mother died at the age of 21, from ovarian cancer, which was diagnosed shortly before her birth. Also the patient's mother is brother had a diagnosis of breast cancer, late in life.   GYNECOLOGIC HISTORY: Menarche age 81, first live birth age 76, she is GX P2, undergoing hysterectomy in 76. She took hormone replacement for approximately 16 years.  SOCIAL HISTORY: She used to work for [employer]. Her husband Shelly Simpson is also retired. Daughter Shelly Simpson works in [employer] as a Audiological scientist; daughter Shelly Simpson teaches biology in college; she has a PhD degree. The patient has 1 grandchild.   ADVANCED DIRECTIVES: Not in place  HEALTH MAINTENANCE: Social History  Substance Use Topics  . Smoking status: Never Smoker   . Smokeless tobacco: Never Used  . Alcohol Use: No     Colonoscopy:  PAP:  Bone density:  Lipid panel:  Allergies  Allergen Reactions  . Iohexol      Desc: pt states in '80's wheezing; dyspnea w/ contrast--pt needs pre meds in future; slg 04/24/2008   . Sulfa Antibiotics     Current Outpatient Prescriptions  Medication Sig Dispense Refill  . albuterol (PROAIR HFA) 108 (90 BASE) MCG/ACT inhaler Inhale 2 puffs into the lungs every 6 (six) hours as  needed. 3 Inhaler 0  . amoxicillin-clavulanate (AUGMENTIN) 875-125 MG per tablet Take 1 tablet by mouth 2 (two) times daily. 20 tablet 0  . anastrozole (ARIMIDEX) 1 MG tablet TAKE 1 TABLET DAILY. 90 tablet 1  . benzonatate (TESSALON) 200 MG capsule Take 1 capsule (200 mg total) by mouth 2 (two) times daily as needed for cough. 20 capsule 0  . calcium citrate-vitamin D (CITRACAL+D) 315-200 MG-UNIT per tablet Take 1 tablet by mouth 2 (two) times daily.    . cetirizine (ZYRTEC) 10 MG tablet Take 10 mg by mouth daily as needed.     . cloNIDine (CATAPRES) 0.1 MG tablet Take 0.1 mg by mouth daily.     . fluticasone (FLONASE) 50 MCG/ACT nasal spray Place 2 sprays into the nose daily as needed.     . Multiple Vitamin (MULTIVITAMIN) capsule Take 1 capsule by mouth daily.    Marland Kitchen olmesartan-hydrochlorothiazide (BENICAR HCT) 20-12.5 MG per tablet Take 1 tablet by mouth daily.    . phenytoin (DILANTIN) 100 MG ER capsule Take by mouth 3 (three) times daily.     No current facility-administered medications for this visit.    OBJECTIVE: Middle-aged woman who appears stated age 34 Vitals:  09/24/15 1216  BP: 124/62  Pulse: 80  Temp: 98.3 F (36.8 C)  Resp: 18     Body mass index is 39.74 kg/(m^2).    ECOG FS: 1  Sclerae unicteric, EOMs intact Oropharynx clear, no thrush or other lesions No cervical or supraclavicular adenopathy Lungs no rales or rhonchi Heart regular rate and rhythm Abd soft, obese, nontender, positive bowel sounds MSK no focal spinal tenderness, no upper extremity lymphedema Neuro: nonfocal, well oriented, appropriate affect Breasts: The right breast is status post central lumpectomy. The breast is actually quite lumpy to palpation and would be difficult to follow by exam alone. The area where the patient feels a subcutaneous mass is were I would expect scar tissue to be most prominent. The right axilla is benign per the left breast is unremarkable.   LAB RESULTS: Lab Results   Component Value Date   WBC 8.8 09/24/2015   NEUTROABS 5.2 09/24/2015   HGB 14.6 09/24/2015   HCT 44.8 09/24/2015   MCV 93.5 09/24/2015   PLT 263 09/24/2015      Chemistry      Component Value Date/Time   NA 142 06/18/2014 0928   NA 139 12/11/2011 0922   K 4.1 06/18/2014 0928   K 3.8 12/11/2011 0922   CL 103 12/11/2011 0922   CO2 25 06/18/2014 0928   CO2 24 12/11/2011 0922   BUN 15.7 06/18/2014 0928   BUN 17 12/11/2011 0922   CREATININE 0.9 06/18/2014 0928   CREATININE 0.79 12/11/2011 0922      Component Value Date/Time   CALCIUM 10.1 06/18/2014 0928   CALCIUM 9.8 12/11/2011 0922   ALKPHOS 82 06/18/2014 0928   ALKPHOS 73 12/11/2011 0922   AST 13 06/18/2014 0928   AST 15 12/11/2011 0922   ALT 20 06/18/2014 0928   ALT 13 12/11/2011 0922   BILITOT 0.40 06/18/2014 0928   BILITOT 0.3 12/11/2011 0922       Lab Results  Component Value Date   LABCA2 32 05/24/2011    No components found for: [id number]  No results for input(s): INR in the last 168 hours.  Urinalysis    Component Value Date/Time   COLORURINE YELLOW 05/20/2008 [place] 05/20/2008 1548   LABSPEC 1.008 05/20/2008 1548   PHURINE 6.5 05/20/2008 1548   GLUCOSEU NEGATIVE 05/20/2008 1548   HGBUR NEGATIVE 05/20/2008 1548   BILIRUBINUR NEGATIVE 05/20/2008 1548   KETONESUR NEGATIVE 05/20/2008 1548   PROTEINUR NEGATIVE 05/20/2008 1548   UROBILINOGEN 0.2 05/20/2008 1548   NITRITE NEGATIVE 05/20/2008 1548   LEUKOCYTESUR  05/20/2008 1548    NEGATIVE MICROSCOPIC NOT DONE ON URINES WITH NEGATIVE PROTEIN, BLOOD, LEUKOCYTES, NITRITE, OR GLUCOSE <1000 mg/dL.    STUDIES: CLINICAL DATA: Patient has a palpable area along her lumpectomy scar upper right breast. Her nipple was removed during her lumpectomy disorder. There is also tenderness in this location.  EXAM: DIGITAL DIAGNOSTIC RIGHT MAMMOGRAM WITH 3D TOMOSYNTHESIS WITH CAD  ULTRASOUND RIGHT BREAST  COMPARISON: Previous  exam(s).  ACR Breast Density Category c: The breast tissue is heterogeneously dense, which may obscure small masses.  FINDINGS: There is architectural distortion as well as dystrophic calcifications in the lumpectomy site, stable from the prior mammograms. There has been a slow progression of dystrophic calcifications in the lumpectomy site and medial to the lumpectomy site in the region of the reported palpable abnormality. There are no discrete masses or other areas of architectural distortion. There are no suspicious calcifications.  Mammographic images were  processed with CAD.  On physical exam, there is a small superficial nodule along the lumpectomy scar just medial to the expected location of the patient's nipple.  Targeted ultrasound is performed, showing a focal area of increased echogenicity with shadowing in the superficial right breast along the lumpectomy scar corresponding to the area of the palpable abnormality. This appears to be calcification at the interface of the dermis and underlying subcutaneous tissue. There are no discrete masses.  IMPRESSION: Benign dystrophic calcification appears to correspond to the area of the palpable abnormality. There is no evidence of recurrent or new breast malignancy. Stable postsurgical scarring also noted, lying lateral to the palpable abnormality.  RECOMMENDATION: Screening mammogram in one year.(Code:[id number])  Patient was also instructed to continue self-examination on a monthly basis. If palpable abnormality appears to be enlarging, repeat diagnostic imaging with possible MRI would be indicated.  I have discussed the findings and recommendations with the patient. Results were also provided in writing at the conclusion of the visit. If applicable, a reminder letter will be sent to the patient regarding the next appointment.  BI-RADS CATEGORY 2: Benign.   Electronically Signed  By: Lajean Manes  M.D.  On: 08/12/2015 16:2  ASSESSMENT: 70 y.o. Shelly Simpson woman status post right lumpectomy and sentinel lymph node sampling 04/08/2008 for apT1c pN1, stage IIA invasive ductal carcinoma, grade 2, estrogen receptor 99% and progesterone receptor 51% positive, with an MIB-1 of 10%, and HER-2 amplification by CISH, with a ratio of 2.45.  (1) treated adjuvantly with carboplatin and Taxotere x2 cycles, poorly tolerated, followed by 4 cycles of weekly carboplatin Abraxane completed 07/21/2008  (2) adjuvant radiation therapy to the right breast completed 10/30/2008  (3) started on letrozole March of 2010, switched to anastrozole February of 2011 because of arthralgias/myalgias, anastrozole discontinued October 2016 when the patient was released from oncologic follow-up  (4) genetic testing sent 05/28/2014-- the following genes showed no abnormality: ATM, BARD1, BRCA1, BRCA2, BRIP1, CDH1, CHEK2, EPCAM, MLH1, MRE11A, MSH2, MSH6, MUTYH, NBN, NF1, PALB2, PMS2, PTEN, RAD50, RAD51C, RAD51D, SMARCA4, STK11, and TP53.  (5) the patient is status post TAH/BSO remotely  PLAN:  Malaina is very concerned about the right breast lump she has been feeling. I went over the mammogram findings and ultrasonography findings in detail with her and gave her a copy of the written report. This was reassuring to her.  We went over options. He could simply repeat mammography in 1 year, which is what was recommended. We could do a right mammogram and ultrasonography in 6 months, which I think would be reasonable. We could proceed to right MRI, which I think may not give [place] the information we want and is likely to be very expensive for her. We could do any of the above and also resume anastrozole.  After much discussion what we decided his that we would repeat a right breast diagnostic mammogram with tomography and ultrasonography in mid July. She will see me shortly thereafter to review results. If there has been no change she  will then have her mammography as usual in November and continue yearly.  We also discussed the possibility of resuming anastrozole. The predicted benefit would be in the 3% range. The actual benefit in terms of distant disease-free recurrence is in the 1% range. She did not want to go back on this medication considering that she is very concerned about osteoporosis.  Accordingly she will see me again late July of this year, after her repeat study. At that point  we will discuss whether she would like to go on our survivorship program. In the meantime I gave her a copy of the [facility] program pamphlet and encouraged her to participate.  MAGRINAT,GUSTAV C    09/24/2015

## 2015-09-25 LAB — CANCER ANTIGEN 27.29: CAN 27.29: 26.2 U/mL (ref 0.0–38.6)

## 2015-09-27 ENCOUNTER — Other Ambulatory Visit: Payer: BLUE CROSS/BLUE SHIELD

## 2015-09-27 ENCOUNTER — Telehealth: Payer: Self-pay | Admitting: *Deleted

## 2015-09-27 ENCOUNTER — Ambulatory Visit: Payer: BLUE CROSS/BLUE SHIELD | Admitting: Nurse Practitioner

## 2015-09-27 NOTE — Telephone Encounter (Signed)
This RN contacted Dr Eduard Clos GYN for appointment per MD referral.  Requested to fax information to Glade Lloyd at [phone].  Records including demographics sent to above.

## 2015-09-28 NOTE — Telephone Encounter (Signed)
NO ENTRY 

## 2015-09-30 ENCOUNTER — Other Ambulatory Visit: Payer: Self-pay | Admitting: *Deleted

## 2015-09-30 ENCOUNTER — Telehealth: Payer: Self-pay | Admitting: Oncology

## 2015-09-30 DIAGNOSIS — C50811 Malignant neoplasm of overlapping sites of right female breast: Secondary | ICD-10-CM

## 2015-09-30 NOTE — Telephone Encounter (Signed)
Spoke with patient and she is aware of her July appts

## 2015-10-28 ENCOUNTER — Emergency Department (HOSPITAL_COMMUNITY): Payer: BLUE CROSS/BLUE SHIELD

## 2015-10-28 ENCOUNTER — Emergency Department (HOSPITAL_COMMUNITY)
Admission: EM | Admit: 2015-10-28 | Discharge: 2015-10-28 | Disposition: A | Discharge status: Home / Self Care | Payer: BLUE CROSS/BLUE SHIELD | Attending: Emergency Medicine | Admitting: Emergency Medicine

## 2015-10-28 ENCOUNTER — Encounter (HOSPITAL_COMMUNITY): Payer: Self-pay

## 2015-10-28 DIAGNOSIS — Y9389 Activity, other specified: Secondary | ICD-10-CM | POA: Diagnosis not present

## 2015-10-28 DIAGNOSIS — Z853 Personal history of malignant neoplasm of breast: Secondary | ICD-10-CM | POA: Insufficient documentation

## 2015-10-28 DIAGNOSIS — Y9289 Other specified places as the place of occurrence of the external cause: Secondary | ICD-10-CM | POA: Insufficient documentation

## 2015-10-28 DIAGNOSIS — S61311A Laceration without foreign body of left index finger with damage to nail, initial encounter: Secondary | ICD-10-CM | POA: Diagnosis not present

## 2015-10-28 DIAGNOSIS — I10 Essential (primary) hypertension: Secondary | ICD-10-CM | POA: Diagnosis not present

## 2015-10-28 DIAGNOSIS — Z23 Encounter for immunization: Secondary | ICD-10-CM | POA: Insufficient documentation

## 2015-10-28 DIAGNOSIS — Y998 Other external cause status: Secondary | ICD-10-CM | POA: Diagnosis not present

## 2015-10-28 DIAGNOSIS — Z79899 Other long term (current) drug therapy: Secondary | ICD-10-CM | POA: Insufficient documentation

## 2015-10-28 DIAGNOSIS — W260XXA Contact with knife, initial encounter: Secondary | ICD-10-CM | POA: Insufficient documentation

## 2015-10-28 DIAGNOSIS — S61209A Unspecified open wound of unspecified finger without damage to nail, initial encounter: Secondary | ICD-10-CM

## 2015-10-28 MED ORDER — TETANUS-DIPHTH-ACELL PERTUSSIS 5-2.5-18.5 LF-MCG/0.5 IM SUSP
0.5000 mL | Freq: Once | INTRAMUSCULAR | Status: AC
  Administered 2015-10-28: 0.5 mL via INTRAMUSCULAR
  Filled 2015-10-28: qty 0.5

## 2015-10-28 MED ORDER — BUPIVACAINE HCL (PF) 0.5 % IJ SOLN
5.0000 mL | Freq: Once | INTRAMUSCULAR | Status: AC
Start: 2015-10-28 — End: 2015-10-28
  Administered 2015-10-28: 5 mL
  Filled 2015-10-28: qty 30

## 2015-10-28 NOTE — ED Provider Notes (Signed)
CSN: [id number]     Arrival date & time 10/28/15  1834 History  By signing my name below, I, [facility], attest that this documentation has been prepared under the direction and in the presence of Yanitza Shvartsman, PA-C. Electronically Signed: Virgel Bouquet, ED Scribe. 10/28/2015. 8:13 PM.    Chief Complaint  Patient presents with  . Extremity Laceration    The history is provided by the patient. No language interpreter was used.   HPI Comments: Shelly Simpson is a 70 y.o. female who presents to the Emergency Department complaining of a 7/10 painful, actively bleeding, left index finger wound to the top of the finger that occurred an hour ago PTA. Patient reports that she was slicing spring onions when she was distracted by her dog, causing her to cut into her left index finger with the kitchen knife, followed immediately by pain and bleeding. Pain worse with palpation. She wrapped the area, applied pressure, and has taken OTC pain medication with slight relief. Patient denies anticoagulation. She denies any other pain or injuries.  Past Medical History  Diagnosis Date  . History of breast cancer   . Family history of ovarian cancer   . Family history of breast cancer in female   . Cancer ([place])   . Hypertension    Past Surgical History  Procedure Laterality Date  . Breast surgery    . Abdominal hysterectomy    . Joint replacement     Family History  Problem Relation Age of Onset  . Ovarian cancer Mother 54    also uterine sarcoma  . Other Father   . Breast cancer Maternal Uncle     female breast cancer; deceased 40  . Breast cancer Cousin 93    Currently 59   Social History  Substance Use Topics  . Smoking status: Never Smoker   . Smokeless tobacco: Never Used  . Alcohol Use: No   OB History    No data available     Review of Systems  Skin: Positive for wound (Left index finger wound).  Neurological: Negative for numbness.  Hematological: Does not bruise/bleed  easily.      Allergies  Iohexol and Sulfa antibiotics  Home Medications   Prior to Admission medications   Medication Sig Start Date End Date Taking? Authorizing Provider  albuterol (PROAIR HFA) 108 (90 BASE) MCG/ACT inhaler Inhale 2 puffs into the lungs every 6 (six) hours as needed. 10/03/13   Darlyne Russian, MD  calcium citrate-vitamin D (CITRACAL+D) 315-200 MG-UNIT per tablet Take 1 tablet by mouth 2 (two) times daily.    Historical Provider, MD  cetirizine (ZYRTEC) 10 MG tablet Take 10 mg by mouth daily as needed.     Historical Provider, MD  cloNIDine (CATAPRES) 0.1 MG tablet Take 0.1 mg by mouth daily.     Historical Provider, MD  fluticasone (FLONASE) 50 MCG/ACT nasal spray Place 2 sprays into the nose daily as needed.     Historical Provider, MD  Multiple Vitamin (MULTIVITAMIN) capsule Take 1 capsule by mouth daily.    Historical Provider, MD  olmesartan-hydrochlorothiazide (BENICAR HCT) 20-12.5 MG per tablet Take 1 tablet by mouth daily.    Historical Provider, MD  phenytoin (DILANTIN) 100 MG ER capsule Take by mouth 3 (three) times daily.    Historical Provider, MD   BP 146/90 mmHg  Pulse 84  Temp(Src) 98.3 F (36.8 C) (Oral)  Resp 18  SpO2 99% Physical Exam  Constitutional: She is oriented to person,  place, and time. She appears well-developed and well-nourished. No distress.  HENT:  Head: Normocephalic and atraumatic.  Eyes: Conjunctivae and EOM are normal.  Neck: Neck supple. No tracheal deviation present.  Cardiovascular: Normal rate.   Pulmonary/Chest: Effort normal. No respiratory distress.  Musculoskeletal: Normal range of motion.  Neurovascularly intact. FROM in finger and hand with 5/5 strength.  Neurological: She is alert and oriented to person, place, and time.  Skin: Skin is warm and dry. Laceration (Avulsion) noted.  1/4 of distal nail and nail bed of left index finger removed with complete avulsion. Avulsion approximately 1.5 cm. Active hemorrhage noted.   Psychiatric: She has a normal mood and affect. Her behavior is normal.  Nursing note and vitals reviewed.   ED Course  .Nerve Block Date/Time: 10/28/2015 7:00 PM Performed by: Lorayne Bender Authorized by: Lorayne Bender Consent: Verbal consent obtained. Risks and benefits: risks, benefits and alternatives were discussed Consent given by: patient Patient understanding: patient states understanding of the procedure being performed Patient consent: the patient's understanding of the procedure matches consent given Procedure consent: procedure consent matches procedure scheduled Patient identity confirmed: verbally with patient and arm band Time out: Immediately prior to procedure a "time out" was called to verify the correct patient, procedure, equipment, support staff and site/side marked as required. Indications: pain relief Body area: upper extremity Nerve: digital Laterality: left Patient sedated: no Preparation: Patient was prepped and draped in the usual sterile fashion. Patient position: sitting Needle gauge: 84 G Location technique: anatomical landmarks Local anesthetic: bupivacaine 0.5% without epinephrine Anesthetic total: 2 ml Outcome: pain improved Patient tolerance: Patient tolerated the procedure well with no immediate complications     DIAGNOSTIC STUDIES: Oxygen Saturation is 99% on RA, normal by my interpretation.    COORDINATION OF CARE: 7:03 PM Will order left index finger x-ray and marcaine injection. Advised pt to apply pressure to the area. Will return to re-evaluate pt pending results of imaging. Discussed treatment plan with pt at bedside and pt agreed to plan.   Labs Review Labs Reviewed - No data to display  Imaging Review Dg Hand Complete Left  10/28/2015  CLINICAL DATA:  70 year old female with laceration of the index finger. EXAM: LEFT HAND - COMPLETE 3+ VIEW COMPARISON:  None. FINDINGS: There is no acute fracture or dislocation the bones are mildly  osteopenic. There is soft tissue irregularity of the distal tip of the index finger. No radiopaque foreign object identified. IMPRESSION: No acute osseous pathology. Electronically Signed   By: Anner Crete M.D.   On: 10/28/2015 19:46   I have personally reviewed and evaluated these images and lab results as part of my medical decision-making.   EKG Interpretation None      MDM   Final diagnoses:  Finger avulsion, initial encounter    Irja S Torbeck resents with a left index finger wound that occurred just prior to arrival.  Findings and plan of care discussed with Orlie Dakin, MD. Dr. Winfred Leeds personally evaluated and examined this patient.  Patient has a small complete avulsion to the tip of the left next finger. There is no area that can be sutured. Direct pressure was applied without success in hemorrhage control. Bleeding was able to be controlled with a hemostatic impregnated gauze. No abnormalities on x-ray. Tetanus updated here in the ED. Patient was given instructions on home wound care as well as return precautions. Patient was advised to return to the ED or go to her PCP in 3 days  for wound check. Patient was also advised to return to the ED should signs of infection arise. Patient voices understanding of these instructions and is comfortable with discharge. Patient appears safe for discharge at this time.  I personally performed the services described in this documentation, which was scribed in my presence. The recorded information has been reviewed and is accurate.   Lorayne Bender, PA-C 10/28/15 2013  Lorayne Bender, PA-C 10/28/15 2016  Orlie Dakin, MD 10/29/15 0100

## 2015-10-28 NOTE — ED Provider Notes (Signed)
Patient suffered avulsion finger to left index fingertip while slicing vegetables earlier today. On exam alert nontoxic. Left index finger with tiny avulsion at tip involving distal and radial aspect of nail bed  Shelly Dakin, MD 10/28/15 2001

## 2015-10-28 NOTE — ED Notes (Signed)
Pt present with c/o left index finger laceration. Pt reports that she was slicing onions and cut her finger with a knife. Bleeding controlled with pressure dressing at this time. Injury is to the top of the finger by the nail bed.

## 2015-10-28 NOTE — Discharge Instructions (Signed)
You have been seen today for a finger injury. Your imaging showed no abnormalities. Follow up with a PCP or return to the ED for a wound check in 3 days. Follow the directions on the attached paperwork for wound care. Return to the ED should you have signs of infection, severe pain, or any other major concerns.

## 2015-11-30 ENCOUNTER — Ambulatory Visit: Payer: Self-pay | Admitting: Surgery

## 2015-11-30 NOTE — H&P (Signed)
Shelly Simpson 11/30/2015 11:33 AM Location: [facility] Patient #: [id number] DOB: 1946-07-25 Married / Language: English / Race: Black or African American Female  History of Present Illness Shelly Simpson A. Shanicka Oldenkamp MD; 11/30/2015 12:20 PM) Patient words: breast f/u   Patient sent at the request of Dr. cousins for a right breast mass. She has a history of a right breast lumpectomy done by Dr. Margot Chimes 2009 for a stage II right breast cancer ER positive PR positive HER-2/neu positive. She has noticed a mass in the scar of her right breast lumpectomy. She was seen by Dr. Randell Patient not last month and repeat mammography in July of this year as recommended by both radiologist after evaluation and Dr. Randell Patient not. At mass is noted the patient. As far she can tell is not any larger than it was in January. Denies any drainage or discharge. In the grams were reviewed and six-month follow-up was recommended since no obvious target was identified.                          CLINICAL DATA: Patient has a palpable area along her lumpectomy scar upper right breast. Her nipple was removed during her lumpectomy disorder. There is also tenderness in this location.  EXAM: DIGITAL DIAGNOSTIC RIGHT MAMMOGRAM WITH 3D TOMOSYNTHESIS WITH CAD  ULTRASOUND RIGHT BREAST  COMPARISON: Previous exam(s).  ACR Breast Density Category c: The breast tissue is heterogeneously dense, which may obscure small masses.  FINDINGS: There is architectural distortion as well as dystrophic calcifications in the lumpectomy site, stable from the prior mammograms. There has been a slow progression of dystrophic calcifications in the lumpectomy site and medial to the lumpectomy site in the region of the reported palpable abnormality. There are no discrete masses or other areas of architectural distortion. There are no suspicious calcifications.  Mammographic images were processed with CAD.  On physical  exam, there is a small superficial nodule along the lumpectomy scar just medial to the expected location of the patient's nipple.  Targeted ultrasound is performed, showing a focal area of increased echogenicity with shadowing in the superficial right breast along the lumpectomy scar corresponding to the area of the palpable abnormality. This appears to be calcification at the interface of the dermis and underlying subcutaneous tissue. There are no discrete masses.  IMPRESSION: Benign dystrophic calcification appears to correspond to the area of the palpable abnormality. There is no evidence of recurrent or new breast malignancy. Stable postsurgical scarring also noted, lying lateral to the palpable abnormality.  RECOMMENDATION: Screening mammogram in one year.(Code:[id number]-B-01Y)  Patient was also instructed to continue self-examination on a monthly basis. If palpable abnormality appears to be enlarging, repeat diagnostic imaging with possible MRI would be indicated.  I have discussed the findings and recommendations with the patient. Results were also provided in writing at the conclusion of the visit. If applicable, a reminder letter will be sent to the patient regarding the next appointment.  BI-RADS CATEGORY 2: Benign.   Electronically Signed By: Lajean Manes M.D. On: 08/12/2015 16:20                per Dr Jana Hakim:          From Dr. Collier Salina Rubin's noted 04/29/2008:  She underwent a screening mammogram in January. She has had previous six monthly screening mammogram for evaluation of benign appearing calcifications. She subsequently had a six-month followup mammogram in July 16th, 2009. This showed indeterminate calcifications in the retroareolar  right breast. Biopsy was recommended. Invasive ductal cancer associated with DCIS was noted. The invasive component was ER and PR positive at 99% and 51% respectively, HER-2 was 2+, and proliferative  index was 10 %. The patient had an MRI scan on March 19, 2008. This showed a 1 x 1 x 0.8 cm area of DCIS with small amount of invasive cancer 6 o'clock position. The other exam is otherwise unremarkable. The patient elected to undergo a lumpectomy and sentinel lymph node evaluation on 04/08/2008. Final pathology showed invasive ductal cancer grade 2 of 3 measuring 1.2 cm. The invasive component was less than 0.1 cm to the deep margin. Additional margin was removed, which showed invasive cancer with DCIS focally less than 0.1 cm with a new margin. Two sentinel lymph nodes were identified, one sentinel lymph node had micro metastases noted. Of note is that the final FISH testing was performed on the primary tumor, which was shown to be positive for amplification in the ratio of 2.45  Subsequent history is as detailed below.  INTERVAL HISTORY: Shelly Simpson returns today for followup of her estrogen receptor positive breast cancer. After she saw me in October 2015, we released her from follow-up and she went off anastrozole. She did not notice any significant changes since going off the anastrozole and in particular hot flashes, vaginal dryness, and fatigue, which were not prominent problems before, were no different afterwards.  More recently she noted a palpable mass in her right breast, which is the surgical breast. She had had mammography with bilateral tomography 06/29/2015 and this was unremarkable. The breast density was category C. This of course reduces mammographic sensitivity. In the early part of December 2016 she noted a palpable area along her left lumpectomy scar which she had not felt previously. Accordingly she was set up for repeat right mammography with tomosynthesis and right breast ultrasonography. This area shows architectural distortion and dystrophic calcifications which appeared stable. There had been a little bit of progression of the dystrophic calcifications medial to the lumpectomy  site which is where the patient has the palpable abnormality. However there are no discrete masses or any other areas of architectural or distortion. On exam there was a small superficial nodule along the lumpectomy scar medial to where the patient's nipple would have been. Ultrasonography found a focal area of increased echogenicity in the superficial right breast corresponding to the palpable abnormality. This seemed to be calcification in the interface of the dermis and underlying subcutaneous tissue, with no associated mass.  The mammographer states that he discussed the findings and recommendations with the patient. The patient says that no one discussed these findings with her. She simply received a letter suggesting that she return in one year. She remains alarmed, and wanted to come and discuss this further with [place].  REVIEW OF SYSTEMS: [place] is not currently exercising regularly. Aside from mild sinus symptoms, a detailed review of systems today was otherwise entirely negative.  The patient is a 70 year old female.   Other Problems Davy Pique Bynum, CMA; 11/30/2015 11:33 AM) Asthma Breast Cancer Gastroesophageal Reflux Disease High blood pressure Lump In Breast Oophorectomy Bilateral. Seizure Disorder  Past Surgical History Marjean Donna, CMA; 11/30/2015 11:33 AM) Breast Biopsy Right. Breast Mass; Local Excision Right. Hysterectomy (not due to cancer) - Complete Knee Surgery Right. Spinal Surgery - Lower Back Tonsillectomy  Diagnostic Studies History Marjean Donna, CMA; 11/30/2015 11:33 AM) Colonoscopy 1-5 years ago Mammogram within last year Pap Smear 1-5 years ago  Allergies (  Marjean Donna, CMA; 11/30/2015 11:35 AM) Iohexol *DIAGNOSTIC PRODUCTS* Sulfa Antibiotics  Medication History (Sonya Bynum, CMA; 11/30/2015 11:36 AM) Irbesartan-Hydrochlorothiazide (150-12.5MG Tablet, Oral) Active. Vitamin D (Ergocalciferol) (50000UNIT Capsule, Oral) Active. Doxycycline  Hyclate (100MG Tablet, Oral) Active. ZyrTEC Allergy (10MG Capsule, Oral) Active. ProAir HFA (108 (90 Base)MCG/ACT Aerosol Soln, Inhalation) Active. Flonase (50MCG/ACT Suspension, Nasal) Active. Multivitamin Adult (Oral) Active. Medications Reconciled  Social History Marjean Donna, CMA; 11/30/2015 11:33 AM) Caffeine use Coffee, Tea. No alcohol use No drug use Tobacco use Never smoker.  Family History Marjean Donna, [place]; 11/30/2015 11:33 AM) Alcohol Abuse Father. Cancer Mother. Hypertension Father. Ovarian Cancer Mother.  Pregnancy / Birth History Marjean Donna, [place]; 11/30/2015 11:33 AM) Age at menarche 43 years. Age of menopause 66-50 Gravida 3 Irregular periods Maternal age 27-25 Para 3     Review of Systems Davy Pique Bynum CMA; 11/30/2015 11:33 AM) General Not Present- Appetite Loss, Chills, Fatigue, Fever, Night Sweats, Weight Gain and Weight Loss. Skin Not Present- Change in Wart/Mole, Dryness, Hives, Jaundice, New Lesions, Non-Healing Wounds, Rash and Ulcer. HEENT Present- Seasonal Allergies and Wears glasses/contact lenses. Not Present- Earache, Hearing Loss, Hoarseness, Nose Bleed, Oral Ulcers, Ringing in the Ears, Sinus Pain, Sore Throat, Visual Disturbances and Yellow Eyes. Respiratory Present- Wheezing. Not Present- Bloody sputum, Chronic Cough, Difficulty Breathing and Snoring. Breast Present- Breast Mass. Not Present- Breast Pain, Nipple Discharge and Skin Changes. Cardiovascular Not Present- Chest Pain, Difficulty Breathing Lying Down, Leg Cramps, Palpitations, Rapid Heart Rate, Shortness of Breath and Swelling of Extremities. Gastrointestinal Not Present- Abdominal Pain, Bloating, Bloody Stool, Change in Bowel Habits, Chronic diarrhea, Constipation, Difficulty Swallowing, Excessive gas, Gets full quickly at meals, Hemorrhoids, Indigestion, Nausea, Rectal Pain and Vomiting. Female Genitourinary Not Present- Frequency, Nocturia, Painful Urination, Pelvic Pain  and Urgency. Musculoskeletal Not Present- Back Pain, Joint Pain, Joint Stiffness, Muscle Pain, Muscle Weakness and Swelling of Extremities. Neurological Not Present- Decreased Memory, Fainting, Headaches, Numbness, Seizures, Tingling, Tremor, Trouble walking and Weakness. Psychiatric Not Present- Anxiety, Bipolar, Change in Sleep Pattern, Depression, Fearful and Frequent crying. Endocrine Not Present- Cold Intolerance, Excessive Hunger, Hair Changes, Heat Intolerance, Hot flashes and New Diabetes. Hematology Not Present- Easy Bruising, Excessive bleeding, Gland problems, HIV and Persistent Infections.  Vitals (Sonya Bynum CMA; 11/30/2015 11:34 AM) 11/30/2015 11:33 AM Weight: 248 lb Height: 67in Body Surface Area: 2.22 m Body Mass Index: 38.84 kg/m  Temp.: 64F(Temporal)  Pulse: 79 (Regular)  BP: 128/80 (Sitting, Left Arm, Standard)      Physical Exam (Kenzie Flakes A. Livie Vanderhoof MD; 11/30/2015 12:18 PM)  General Mental Status-Alert. General Appearance-Consistent with stated age. Hydration-Well hydrated. Voice-Normal.  Eye Eyeball - Bilateral-Extraocular movements intact. Sclera/Conjunctiva - Bilateral-No scleral icterus.  Breast Note: Right breast mass 1 cm noted. Right nipple is absent from surgery. Mass is directly below the midportion of the incision. It is mobile. It is ill-defined. No other masses right breast. Upper right breast scar noted. Right axillary scar noted. Left breast normal without mass lesion nipple discharge or cosmetic deformity. Left axilla normal.   Neurologic Neurologic evaluation reveals -alert and oriented x 3 with no impairment of recent or remote memory. Mental Status-Normal.  Lymphatic Head & Neck  General Head & Neck Lymphatics: Bilateral - Description - Normal. Axillary  General Axillary Region: Bilateral - Description - Normal. Tenderness - Non Tender.    Assessment & Plan (Renelle Stegenga A. Lakeyshia Tuckerman MD; 11/30/2015 12:21  PM)  BREAST MASS, RIGHT (N63) Impression: Discussed options of observation with short-term follow-up versus removal of the mass. The pros and  cons to each approach discussed. Both are good options in this circumstance. I discussed the potential complications of surgical removal of this mass especially since she's had previous radiation therapy. Risk of bleeding, infection, poor wound healing, cosmetic deformity, and the need for other operative procedures discussed with her. She will discuss this with her husband and get back to [place] to see she was to proceed. She is oriented scheduled for follow-up with Dr. Randell Patient not in July as well as follow-up mammography.  Current Plans You are being scheduled for surgery - Our schedulers will call you.  You should hear from our office's scheduling department within 5 working days about the location, date, and time of surgery. We try to make accommodations for patient's preferences in scheduling surgery, but sometimes the OR schedule or the surgeon's schedule prevents [place] from making those accommodations.  If you have not heard from our office [phone]) in 5 working days, call the office and ask for your surgeon's nurse.  If you have other questions about your diagnosis, plan, or surgery, call the office and ask for your surgeon's nurse.  We discussed the staging and pathophysiology of breast cancer. We discussed all of the different options for treatment for breast cancer including surgery, chemotherapy, radiation therapy, Herceptin, and antiestrogen therapy. We discussed a sentinel lymph node biopsy as she does not appear to having lymph node involvement right now. We discussed the performance of that with injection of radioactive tracer and blue dye. We discussed that she would have an incision underneath her axillary hairline. We discussed that there is a bout a 10-20% chance of having a positive node with a sentinel lymph node biopsy and we will await the  permanent pathology to make any other first further decisions in terms of her treatment. One of these options might be to return to the operating room to perform an axillary lymph node dissection. We discussed about a 1-2% risk lifetime of chronic shoulder pain as well as lymphedema associated with a sentinel lymph node biopsy. We discussed the options for treatment of the breast cancer which included lumpectomy versus a mastectomy. We discussed the performance of the lumpectomy with a wire placement. We discussed a 10-20% chance of a positive margin requiring reexcision in the operating room. We also discussed that she may need radiation therapy or antiestrogen therapy or both if she undergoes lumpectomy. We discussed the mastectomy and the postoperative care for that as well. We discussed that there is no difference in her survival whether she undergoes lumpectomy with radiation therapy or antiestrogen therapy versus a mastectomy. There is a slight difference in the local recurrence rate being 3-5% with lumpectomy and about 1% with a mastectomy. We discussed the risks of operation including bleeding, infection, possible reoperation. She understands her further therapy will be based on what her stages at the time of her operation.  Pt Education - [facility] Breast Biopsy HCI: discussed with patient and provided information. HISTORY OF BREAST CANCER (Z85.3)

## 2016-01-27 ENCOUNTER — Telehealth: Payer: Self-pay | Admitting: *Deleted

## 2016-01-27 NOTE — Telephone Encounter (Signed)
Pt contacted this RN wanting to " make sure everyone is on the same page ".  Shelly Simpson states she was seen by Shelly Simpson late January with a known area in her breast- she was then seen by the GYN and then by Shelly Simpson.  Due to ongoing palpable mass she is scheduled for lumpectomy under Shelly Simpson in June.  Pt is scheduled for follow up with Shelly Simpson in July.  " I was called by [facility] to come in to have some of scan done and want to make sure that is correct "  This is discussed with pt above request is part of Shelly Cornett's surgical work up and often breast scans are obtained prior to surgery for verification and may  include insertion of " marker " to aid in surgical procedure.  Informed pt that Shelly Simpson and Shelly Simpson are in communication which this RN may not be aware of- as well as her concerns will be given to Shelly Simpson for additional communication.  [name] has no further questions and stated appreciation of above discussion.  This note will be given to MD.

## 2016-02-01 ENCOUNTER — Telehealth: Payer: Self-pay | Admitting: *Deleted

## 2016-02-01 ENCOUNTER — Other Ambulatory Visit: Payer: Self-pay | Admitting: Oncology

## 2016-02-01 NOTE — Telephone Encounter (Signed)
Patient called wanting to know Dr. Virgie Dad opinion about the lump in her breast. States that lump feels smaller and she wasn't sure about having surgery. Discussed with Dr. Jana Hakim. He recommended  to postpone surgery and continue follow-up. Advised patient of this and she verbalized understanding.

## 2016-02-08 ENCOUNTER — Encounter (HOSPITAL_BASED_OUTPATIENT_CLINIC_OR_DEPARTMENT_OTHER): Admission: RE | Payer: Self-pay | Source: Ambulatory Visit

## 2016-02-08 ENCOUNTER — Ambulatory Visit (HOSPITAL_BASED_OUTPATIENT_CLINIC_OR_DEPARTMENT_OTHER): Admission: RE | Admit: 2016-02-08 | Payer: BLUE CROSS/BLUE SHIELD | Source: Ambulatory Visit | Admitting: Surgery

## 2016-02-08 SURGERY — BREAST LUMPECTOMY
Anesthesia: General | Laterality: Right

## 2016-03-13 ENCOUNTER — Ambulatory Visit
Admission: RE | Admit: 2016-03-13 | Discharge: 2016-03-13 | Disposition: A | Discharge status: Home / Self Care | Payer: BLUE CROSS/BLUE SHIELD | Source: Ambulatory Visit | Attending: Oncology | Admitting: Oncology

## 2016-03-13 DIAGNOSIS — C50811 Malignant neoplasm of overlapping sites of right female breast: Secondary | ICD-10-CM

## 2016-03-15 ENCOUNTER — Other Ambulatory Visit: Payer: Self-pay

## 2016-03-15 DIAGNOSIS — C50811 Malignant neoplasm of overlapping sites of right female breast: Secondary | ICD-10-CM

## 2016-03-16 ENCOUNTER — Other Ambulatory Visit: Payer: BLUE CROSS/BLUE SHIELD

## 2016-03-17 ENCOUNTER — Other Ambulatory Visit (HOSPITAL_BASED_OUTPATIENT_CLINIC_OR_DEPARTMENT_OTHER): Payer: BLUE CROSS/BLUE SHIELD

## 2016-03-17 DIAGNOSIS — C50811 Malignant neoplasm of overlapping sites of right female breast: Secondary | ICD-10-CM

## 2016-03-17 LAB — CBC WITH DIFFERENTIAL/PLATELET
BASO%: 1 % (ref 0.0–2.0)
BASOS ABS: 0.1 10*3/uL (ref 0.0–0.1)
EOS ABS: 0.1 10*3/uL (ref 0.0–0.5)
EOS%: 1.6 % (ref 0.0–7.0)
HEMATOCRIT: 44.9 % (ref 34.8–46.6)
HEMOGLOBIN: 14.5 g/dL (ref 11.6–15.9)
LYMPH%: 29.6 % (ref 14.0–49.7)
MCH: 29.9 pg (ref 25.1–34.0)
MCHC: 32.2 g/dL (ref 31.5–36.0)
MCV: 92.7 fL (ref 79.5–101.0)
MONO#: 0.5 10*3/uL (ref 0.1–0.9)
MONO%: 7 % (ref 0.0–14.0)
NEUT%: 60.8 % (ref 38.4–76.8)
NEUTROS ABS: 4.6 10*3/uL (ref 1.5–6.5)
PLATELETS: 252 10*3/uL (ref 145–400)
RBC: 4.84 10*6/uL (ref 3.70–5.45)
RDW: 12.3 % (ref 11.2–14.5)
WBC: 7.5 10*3/uL (ref 3.9–10.3)
lymph#: 2.2 10*3/uL (ref 0.9–3.3)

## 2016-03-17 LAB — COMPREHENSIVE METABOLIC PANEL
ALBUMIN: 4 g/dL (ref 3.5–5.0)
ALK PHOS: 81 U/L (ref 40–150)
ALT: 23 U/L (ref 0–55)
ANION GAP: 12 meq/L — AB (ref 3–11)
AST: 15 U/L (ref 5–34)
BILIRUBIN TOTAL: 0.43 mg/dL (ref 0.20–1.20)
BUN: 11 mg/dL (ref 7.0–26.0)
CALCIUM: 9.5 mg/dL (ref 8.4–10.4)
CO2: 23 mEq/L (ref 22–29)
Chloride: 106 mEq/L (ref 98–109)
Creatinine: 0.9 mg/dL (ref 0.6–1.1)
EGFR: 79 mL/min/{1.73_m2} — AB (ref >90)
GLUCOSE: 141 mg/dL — AB (ref 70–140)
Potassium: 4.1 mEq/L (ref 3.5–5.1)
Sodium: 141 mEq/L (ref 136–145)
TOTAL PROTEIN: 7.1 g/dL (ref 6.4–8.3)

## 2016-03-23 ENCOUNTER — Telehealth: Payer: Self-pay | Admitting: Oncology

## 2016-03-23 ENCOUNTER — Ambulatory Visit (HOSPITAL_BASED_OUTPATIENT_CLINIC_OR_DEPARTMENT_OTHER): Payer: BLUE CROSS/BLUE SHIELD | Admitting: Oncology

## 2016-03-23 VITALS — BP 154/90 | HR 82 | Temp 98.9°F | Resp 18 | Ht 66.0 in | Wt 239.2 lb

## 2016-03-23 DIAGNOSIS — C50811 Malignant neoplasm of overlapping sites of right female breast: Secondary | ICD-10-CM | POA: Diagnosis not present

## 2016-03-23 DIAGNOSIS — Z170 Estrogen receptor positive status [ER+]: Secondary | ICD-10-CM

## 2016-03-23 NOTE — Progress Notes (Signed)
ID: Shelly Simpson   DOB: 27-Sep-1945  MR#: [id number]  CSN#:[id number]  PCP: Foye Spurling, MD GYN:  SUOsborn Coho OTHER MD: Kyung Rudd   BREAST CANCER HISTORY:  From Dr. Collier Salina Rubin's noted 04/29/2008:  She underwent a screening mammogram in January.  She has had previous six monthly screening mammogram for evaluation of benign appearing calcifications.  She subsequently had a six-month followup mammogram in July 16th, 2009.  This showed indeterminate calcifications in the retroareolar right breast.  Biopsy was recommended.  Invasive ductal cancer associated with DCIS was noted.  The invasive component was ER and PR positive at 99% and 51% respectively, HER-2 was 2+, and proliferative index was 10 %.  The patient had an MRI scan on March 19, 2008.  This showed a 1 x 1 x 0.8 cm area of DCIS with small amount of invasive cancer 6 o'clock position.  The other exam is otherwise unremarkable.  The patient elected to undergo a lumpectomy and sentinel lymph node evaluation on 04/08/2008.  Final pathology showed invasive ductal cancer grade 2 of 3 measuring 1.2 cm.  The invasive component was less than 0.1 cm to the deep margin.  Additional margin was removed, which showed invasive cancer with DCIS focally less than 0.1 cm with a new margin.  Two sentinel lymph nodes were identified, one sentinel lymph node had micro metastases noted.  Of note is that the final FISH testing was performed on the primary tumor, which was shown to be positive for amplification in the ratio of 2.45  Subsequent history is as detailed below.  INTERVAL HISTORY: Cyrene returns today for followup of her right-sided breast cancer. She has been concerned about a palpable area in the central right breast, and she has seen her surgeon and OB/GYN about this. We just obtained mammography and ultrasonography of this area, which shows an area of fat necrosis. Ultrasound showed no masses and no suspicious areas for recurrent  disease.  REVIEW OF SYSTEMS: Rokia remains very concerned about her husband who underwent seeds treatment for his prostate cancer and recent cataract surgery. She is otherwise not exercising regularly. A detailed review of systems was otherwise stable.  PAST MEDICAL HISTORY: Past Medical History:  Diagnosis Date  . Cancer ([place])   . Family history of breast cancer in female   . Family history of ovarian cancer   . History of breast cancer   . Hypertension    Seizures, hypertension, hypercholesterolemia  PAST SURGICAL HISTORY: Past Surgical History:  Procedure Laterality Date  . ABDOMINAL HYSTERECTOMY    . BREAST SURGERY    . JOINT REPLACEMENT     Status post hysterectomy with bilateral stalpingo- oophorectomy, status post laminectomy, status post tonsillectomy and adenoidectomy, status post knee surgery  FAMILY HISTORY Family History  Problem Relation Age of Onset  . Ovarian cancer Mother 67    also uterine sarcoma  . Other Father   . Breast cancer Maternal Uncle     female breast cancer; deceased 79  . Breast cancer Cousin 32    Currently 75   The patient's father died in his 61s from congestive heart failure and what sounds like a pulmonary embolus. The patient's mother died at the age of 59, from ovarian cancer, which was diagnosed shortly before her birth. Also the patient's mother is brother had a diagnosis of breast cancer, late in life.   GYNECOLOGIC HISTORY: Menarche age 39, first live birth age 20, she is GX P2, undergoing hysterectomy in 74.  She took hormone replacement for approximately 16 years.  SOCIAL HISTORY: She used to work for [employer]. Her husband Eddie Dibbles is also retired. Daughter Ilina Xu works in [place] as a Audiological scientist; daughter Precious Gilding teaches biology in college; she has a PhD degree. The patient has 1 grandchild.   ADVANCED DIRECTIVES: Not in place  HEALTH MAINTENANCE: Social History  Substance Use Topics  . Smoking status:  Never Smoker  . Smokeless tobacco: Never Used  . Alcohol use No     Colonoscopy:  PAP:  Bone density:  Lipid panel:  Allergies  Allergen Reactions  . Iohexol      Desc: pt states in '80's wheezing; dyspnea w/ contrast--pt needs pre meds in future; slg 04/24/2008   . Sulfa Antibiotics     Current Outpatient Prescriptions  Medication Sig Dispense Refill  . albuterol (PROAIR HFA) 108 (90 BASE) MCG/ACT inhaler Inhale 2 puffs into the lungs every 6 (six) hours as needed. 3 Inhaler 0  . calcium citrate-vitamin D (CITRACAL+D) 315-200 MG-UNIT per tablet Take 1 tablet by mouth 2 (two) times daily.    . cetirizine (ZYRTEC) 10 MG tablet Take 10 mg by mouth daily as needed.     . cloNIDine (CATAPRES) 0.1 MG tablet Take 0.1 mg by mouth daily.     . fluticasone (FLONASE) 50 MCG/ACT nasal spray Place 2 sprays into the nose daily as needed.     . Multiple Vitamin (MULTIVITAMIN) capsule Take 1 capsule by mouth daily.    Marland Kitchen olmesartan-hydrochlorothiazide (BENICAR HCT) 20-12.5 MG per tablet Take 1 tablet by mouth daily.    . phenytoin (DILANTIN) 100 MG ER capsule Take by mouth 3 (three) times daily.     No current facility-administered medications for this visit.     OBJECTIVE: Middle-aged womanIn no acute distress Vitals:   03/23/16 1201  BP: (!) 154/90  Pulse: 82  Resp: 18  Temp: 98.9 F (37.2 C)     Body mass index is 38.61 kg/m.    ECOG FS: 1  Sclerae unicteric, pupils round and equal Oropharynx clear and moist-- no thrush or other lesions No cervical or supraclavicular adenopathy Lungs no rales or rhonchi Heart regular rate and rhythm Abd soft, obese, nontender, positive bowel sounds MSK no focal spinal tenderness, no upper extremity lymphedema Neuro: nonfocal, well oriented, anxious affect Breasts: The right breast is status post lumpectomy and radiation. There is an area of firmness associated with the scar. This is otherwise unremarkable. The right axilla is benign. The left  breast is unremarkable  LAB RESULTS: Lab Results  Component Value Date   WBC 7.5 03/17/2016   NEUTROABS 4.6 03/17/2016   HGB 14.5 03/17/2016   HCT 44.9 03/17/2016   MCV 92.7 03/17/2016   PLT 252 03/17/2016      Chemistry      Component Value Date/Time   NA 141 03/17/2016 1144   K 4.1 03/17/2016 1144   CL 103 12/11/2011 0922   CO2 23 03/17/2016 1144   BUN 11.0 03/17/2016 1144   CREATININE 0.9 03/17/2016 1144      Component Value Date/Time   CALCIUM 9.5 03/17/2016 1144   ALKPHOS 81 03/17/2016 1144   AST 15 03/17/2016 1144   ALT 23 03/17/2016 1144   BILITOT 0.43 03/17/2016 1144       Lab Results  Component Value Date   LABCA2 32 05/24/2011    No components found for: [id number]  No results for input(s): INR in the last 168 hours.  Urinalysis  Component Value Date/Time   COLORURINE YELLOW 05/20/2008 1548   APPEARANCEUR CLEAR 05/20/2008 1548   LABSPEC 1.008 05/20/2008 1548   PHURINE 6.5 05/20/2008 1548   GLUCOSEU NEGATIVE 05/20/2008 1548   HGBUR NEGATIVE 05/20/2008 1548   BILIRUBINUR NEGATIVE 05/20/2008 1548   KETONESUR NEGATIVE 05/20/2008 1548   PROTEINUR NEGATIVE 05/20/2008 1548   UROBILINOGEN 0.2 05/20/2008 1548   NITRITE NEGATIVE 05/20/2008 1548   LEUKOCYTESUR  05/20/2008 1548    NEGATIVE MICROSCOPIC NOT DONE ON URINES WITH NEGATIVE PROTEIN, BLOOD, LEUKOCYTES, NITRITE, OR GLUCOSE <1000 mg/dL.    STUDIES: Study Result   CLINICAL DATA:  70 year old female presenting for re-evaluation of a palpable area of concern in the right breast. The patient has history of a right breast lumpectomy in 2009. She began feeling a palpable area in about December of 2016. She was evaluated at that time and the palpable area was determined to correspond with a dystrophic calcification.  EXAM: 2D DIGITAL DIAGNOSTIC UNILATERAL RIGHT MAMMOGRAM WITH CAD AND ADJUNCT TOMO  RIGHT BREAST ULTRASOUND  COMPARISON:  Previous exam(s).  ACR Breast Density Category c: The  breast tissue is heterogeneously dense, which may obscure small masses.  FINDINGS: A palpable marker has been placed over the area of concern in the right breast. On the routine views, this marker immediately overlies a coarse dystrophic calcification from fat necrosis. No new suspicious calcifications, masses or areas of distortion are seen in the right breast.  Mammographic images were processed with CAD.  Physical exam of the palpable area of concern demonstrates a small firm lump at the scar in the central right breast at the site of the patient's nipple removal. The scar is well-healed. No abnormal skin changes are identified.  Ultrasound targeted to the palpable area of concern demonstrates a superficial echogenic rim with posterior shadowing corresponding with the dystrophic calcification identified mammographically. No soft tissue masses or suspicious areas of shadowing are identified adjacent to this calcification.  IMPRESSION: The palpable area of concern in the central right breast corresponds with a superficial benign dystrophic calcification. There is no mammographic or targeted sonographic evidence of right breast malignancy.  RECOMMENDATION: Continued screening mammography is recommended which will be due in November of 2017.  I have discussed the findings and recommendations with the patient. Results were also provided in writing at the conclusion of the visit. If applicable, a reminder letter will be sent to the patient regarding the next appointment.  BI-RADS CATEGORY  2: Benign.   Electronically Signed   By: Ammie Ferrier M.D.   On: 03/13/2016 10:10     ASSESSMENT: 70 y.o. [place] woman status post right lumpectomy and sentinel lymph node sampling 04/08/2008 for apT1c pN1, stage IIA invasive ductal carcinoma of overlapping sites, grade 2, estrogen receptor 99% and progesterone receptor 51% positive, with an MIB-1 of 10%, and HER-2  amplification by CISH, with a ratio of 2.45.  (1) treated adjuvantly with carboplatin and Taxotere x2 cycles, poorly tolerated, followed by 4 cycles of weekly carboplatin Abraxane completed 07/21/2008  (2) adjuvant radiation therapy to the right breast completed 10/30/2008  (3) started on letrozole March of 2010, switched to anastrozole February of 2011 because of arthralgias/myalgias, anastrozole discontinued October 2016 when the patient was released from oncologic follow-up  (4) genetic testing sent 05/28/2014-- the following genes showed no abnormality: ATM, BARD1, BRCA1, BRCA2, BRIP1, CDH1, CHEK2, EPCAM, MLH1, MRE11A, MSH2, MSH6, MUTYH, NBN, NF1, PALB2, PMS2, PTEN, RAD50, RAD51C, RAD51D, SMARCA4, STK11, and TP53.  (5) the patient is status  post TAH/BSO remotely  PLAN:  I reviewed with Ahana the results of her repeat right breast mammography and ultrasonography, which are essentially unchanged from last December. We also reviewed her lab work. We discussed what fat necrosis is and why it occurs.  This was reassuring to her but she remains very anxious. I am going to see her again in December, after her next set of mammograms. Assuming all remains well I will release her from follow-up then but transition her to our survivorship program.  Today I gave her information on our tai chi classes here. I strongly encouraged her to participate. Avalie Oconnor C    03/23/2016

## 2016-03-23 NOTE — Telephone Encounter (Signed)
appt made and avs printed °

## 2016-06-28 ENCOUNTER — Telehealth: Payer: Self-pay | Admitting: *Deleted

## 2016-06-28 NOTE — Telephone Encounter (Signed)
1. "My 70 yr old daughter in [place] was diagnosed with a breast cancer that appears to be the same cancer I have.  I need to know if BRCA was tested and the results.  She is going to need these test and it will be helpful for them to know my results."  No result copies located by this nurse.  Genetic testing performed May 28, 2014.  Progress note by Steele Berg, MS, CGC on 06-16-2014 reads tests were performed and negative.  Called patient with this information.  Asked for copy.  This office note in envelope at registration for patient pick up.  2.  With this call question asked if she "needs breast MRI.  I had nipple removed.  At the incision there is a hard place the surgeon says feels like scar tissue.  My type of cancer can return within 8 to 10 years is why I ask."

## 2016-07-04 ENCOUNTER — Other Ambulatory Visit: Payer: Self-pay | Admitting: Oncology

## 2016-07-04 DIAGNOSIS — Z853 Personal history of malignant neoplasm of breast: Secondary | ICD-10-CM

## 2016-07-04 DIAGNOSIS — Z1231 Encounter for screening mammogram for malignant neoplasm of breast: Secondary | ICD-10-CM

## 2016-07-07 ENCOUNTER — Ambulatory Visit: Payer: BLUE CROSS/BLUE SHIELD

## 2016-07-14 ENCOUNTER — Ambulatory Visit
Admission: RE | Admit: 2016-07-14 | Discharge: 2016-07-14 | Disposition: A | Discharge status: Home / Self Care | Payer: BLUE CROSS/BLUE SHIELD | Source: Ambulatory Visit | Attending: Oncology | Admitting: Oncology

## 2016-07-14 DIAGNOSIS — Z853 Personal history of malignant neoplasm of breast: Secondary | ICD-10-CM

## 2016-07-14 DIAGNOSIS — Z1231 Encounter for screening mammogram for malignant neoplasm of breast: Secondary | ICD-10-CM

## 2016-07-31 ENCOUNTER — Telehealth: Payer: Self-pay | Admitting: Emergency Medicine

## 2016-07-31 ENCOUNTER — Telehealth: Payer: Self-pay | Admitting: Oncology

## 2016-07-31 NOTE — Telephone Encounter (Signed)
Patient called to reschedule appointments per her daughter is having surgery on 12.15.18 and lives out of town. Patient stated she would be away for two weeks and requested to have appointment in January. Also requested to speak with desk nurse. LVM

## 2016-07-31 NOTE — Telephone Encounter (Signed)
Patient called to notify Dr Jana Hakim that she was rescheduling her follow up appointment due to her daughter's recent breast cancer diagnosis. States her daughter is having breast surgery on 12/15 and Mrs Tomek will be in [place] during that time with her daughter and her family. Advised patient to call this office for any questions or concerns; patient verbalized understanding.

## 2016-08-03 ENCOUNTER — Other Ambulatory Visit: Payer: BLUE CROSS/BLUE SHIELD

## 2016-08-10 ENCOUNTER — Ambulatory Visit: Payer: BLUE CROSS/BLUE SHIELD | Admitting: Oncology

## 2016-08-29 ENCOUNTER — Other Ambulatory Visit: Payer: Self-pay | Admitting: *Deleted

## 2016-08-29 DIAGNOSIS — C50811 Malignant neoplasm of overlapping sites of right female breast: Secondary | ICD-10-CM

## 2016-08-30 ENCOUNTER — Other Ambulatory Visit (HOSPITAL_BASED_OUTPATIENT_CLINIC_OR_DEPARTMENT_OTHER): Payer: BLUE CROSS/BLUE SHIELD

## 2016-08-30 DIAGNOSIS — C50811 Malignant neoplasm of overlapping sites of right female breast: Secondary | ICD-10-CM

## 2016-08-30 LAB — CBC WITH DIFFERENTIAL/PLATELET
BASO%: 0.6 % (ref 0.0–2.0)
Basophils Absolute: 0.1 10*3/uL (ref 0.0–0.1)
EOS ABS: 0.2 10*3/uL (ref 0.0–0.5)
EOS%: 1.8 % (ref 0.0–7.0)
HEMATOCRIT: 41.6 % (ref 34.8–46.6)
HGB: 13.7 g/dL (ref 11.6–15.9)
LYMPH%: 30.7 % (ref 14.0–49.7)
MCH: 30.3 pg (ref 25.1–34.0)
MCHC: 32.9 g/dL (ref 31.5–36.0)
MCV: 92 fL (ref 79.5–101.0)
MONO#: 0.6 10*3/uL (ref 0.1–0.9)
MONO%: 7.1 % (ref 0.0–14.0)
NEUT#: 5.1 10*3/uL (ref 1.5–6.5)
NEUT%: 59.8 % (ref 38.4–76.8)
PLATELETS: 281 10*3/uL (ref 145–400)
RBC: 4.52 10*6/uL (ref 3.70–5.45)
RDW: 12.1 % (ref 11.2–14.5)
WBC: 8.5 10*3/uL (ref 3.9–10.3)
lymph#: 2.6 10*3/uL (ref 0.9–3.3)

## 2016-08-30 LAB — COMPREHENSIVE METABOLIC PANEL
ALK PHOS: 83 U/L (ref 40–150)
ALT: 19 U/L (ref 0–55)
ANION GAP: 10 meq/L (ref 3–11)
AST: 15 U/L (ref 5–34)
Albumin: 4.3 g/dL (ref 3.5–5.0)
BILIRUBIN TOTAL: 0.4 mg/dL (ref 0.20–1.20)
BUN: 18.4 mg/dL (ref 7.0–26.0)
CALCIUM: 10.2 mg/dL (ref 8.4–10.4)
CHLORIDE: 106 meq/L (ref 98–109)
CO2: 24 meq/L (ref 22–29)
CREATININE: 0.8 mg/dL (ref 0.6–1.1)
EGFR: 83 mL/min/{1.73_m2} — ABNORMAL LOW (ref >90)
Glucose: 87 mg/dl (ref 70–140)
Potassium: 4.1 mEq/L (ref 3.5–5.1)
Sodium: 141 mEq/L (ref 136–145)
Total Protein: 7.2 g/dL (ref 6.4–8.3)

## 2016-09-05 ENCOUNTER — Ambulatory Visit (HOSPITAL_BASED_OUTPATIENT_CLINIC_OR_DEPARTMENT_OTHER): Payer: BLUE CROSS/BLUE SHIELD | Admitting: Oncology

## 2016-09-05 VITALS — BP 125/57 | HR 88 | Temp 97.6°F | Resp 18 | Ht 66.0 in | Wt 225.8 lb

## 2016-09-05 DIAGNOSIS — F411 Generalized anxiety disorder: Secondary | ICD-10-CM

## 2016-09-05 DIAGNOSIS — Z170 Estrogen receptor positive status [ER+]: Principal | ICD-10-CM

## 2016-09-05 DIAGNOSIS — Z853 Personal history of malignant neoplasm of breast: Secondary | ICD-10-CM

## 2016-09-05 DIAGNOSIS — C50811 Malignant neoplasm of overlapping sites of right female breast: Secondary | ICD-10-CM

## 2016-09-05 NOTE — Progress Notes (Signed)
ID: Shelly Simpson   DOB: 27-Dec-1945  MR#: [id number]  CSN#:[id number]  PCP: Foye Spurling, MD GYN:  SUOsborn Coho OTHER MD: Kyung Rudd  Chief complaint: Estrogen receptor positive breast cancer  Current treatment: Observation   BREAST CANCER HISTORY:  From Dr. Collier Salina Rubin's noted 04/29/2008:  She underwent a screening mammogram in January.  She has had previous six monthly screening mammogram for evaluation of benign appearing calcifications.  She subsequently had a six-month followup mammogram in July 16th, 2009.  This showed indeterminate calcifications in the retroareolar right breast.  Biopsy was recommended.  Invasive ductal cancer associated with DCIS was noted.  The invasive component was ER and PR positive at 99% and 51% respectively, HER-2 was 2+, and proliferative index was 10 %.  The patient had an MRI scan on March 19, 2008.  This showed a 1 x 1 x 0.8 cm area of DCIS with small amount of invasive cancer 6 o'clock position.  The other exam is otherwise unremarkable.  The patient elected to undergo a lumpectomy and sentinel lymph node evaluation on 04/08/2008.  Final pathology showed invasive ductal cancer grade 2 of 3 measuring 1.2 cm.  The invasive component was less than 0.1 cm to the deep margin.  Additional margin was removed, which showed invasive cancer with DCIS focally less than 0.1 cm with a new margin.  Two sentinel lymph nodes were identified, one sentinel lymph node had micro metastases noted.  Of note is that the final FISH testing was performed on the primary tumor, which was shown to be positive for amplification in the ratio of 2.45  Subsequent history is as detailed below.  INTERVAL HISTORY: Shelly Simpson returns today for follow-up of her remote estrogen receptor positive breast cancer. The interval history is significant for her daughter in [place] having been diagnosed with breast cancer. She is being treated in [place]. Note that her daughter has a PhD and  microbiology" she found the best doctors for herself". Shelly Simpson had many questions regarding this today.  REVIEW OF SYSTEMS: Shelly Simpson herself is doing well. She continues to volunteer at [facility]. She is active although she does not exercise regularly. She had a colonoscopy since her last visit here. Aside from all these issues a detailed review of systems today was benign.  PAST MEDICAL HISTORY: Past Medical History:  Diagnosis Date  . Cancer ([place])   . Family history of breast cancer in female   . Family history of ovarian cancer   . History of breast cancer   . Hypertension    Seizures, hypertension, hypercholesterolemia  PAST SURGICAL HISTORY: Past Surgical History:  Procedure Laterality Date  . ABDOMINAL HYSTERECTOMY    . BREAST SURGERY    . JOINT REPLACEMENT     Status post hysterectomy with bilateral stalpingo- oophorectomy, status post laminectomy, status post tonsillectomy and adenoidectomy, status post knee surgery  FAMILY HISTORY Family History  Problem Relation Age of Onset  . Ovarian cancer Mother 6    also uterine sarcoma  . Other Father   . Breast cancer Maternal Uncle     female breast cancer; deceased 73  . Breast cancer Cousin 71    Currently 3   The patient's father died in his 86s from congestive heart failure and what sounds like a pulmonary embolus. The patient's mother died at the age of 43, from ovarian cancer, which was diagnosed shortly before her birth. Also the patient's mother is brother had a diagnosis of breast cancer, late in life.  GYNECOLOGIC HISTORY: Menarche age 20, first live birth age 69, she is GX P2, undergoing hysterectomy in 32. She took hormone replacement for approximately 16 years.  SOCIAL HISTORY: She used to work for [employer]. Her husband Eddie Dibbles is also retired. Daughter Jazmyn Offner works in [place] as a Audiological scientist; daughter Precious Gilding teaches biology in college; she has a PhD degree. The patient has 1  grandchild.   ADVANCED DIRECTIVES: Not in place  HEALTH MAINTENANCE: Social History  Substance Use Topics  . Smoking status: Never Smoker  . Smokeless tobacco: Never Used  . Alcohol use No     Allergies  Allergen Reactions  . Iohexol      Desc: pt states in '80's wheezing; dyspnea w/ contrast--pt needs pre meds in future; slg 04/24/2008   . Sulfa Antibiotics     Current Outpatient Prescriptions  Medication Sig Dispense Refill  . albuterol (PROAIR HFA) 108 (90 BASE) MCG/ACT inhaler Inhale 2 puffs into the lungs every 6 (six) hours as needed. 3 Inhaler 0  . calcium citrate-vitamin D (CITRACAL+D) 315-200 MG-UNIT per tablet Take 1 tablet by mouth 2 (two) times daily.    . cetirizine (ZYRTEC) 10 MG tablet Take 10 mg by mouth daily as needed.     . cloNIDine (CATAPRES) 0.1 MG tablet Take 0.1 mg by mouth daily.     . fluticasone (FLONASE) 50 MCG/ACT nasal spray Place 2 sprays into the nose daily as needed.     . Multiple Vitamin (MULTIVITAMIN) capsule Take 1 capsule by mouth daily.    Marland Kitchen olmesartan-hydrochlorothiazide (BENICAR HCT) 20-12.5 MG per tablet Take 1 tablet by mouth daily.    . phenytoin (DILANTIN) 100 MG ER capsule Take by mouth 3 (three) times daily.     No current facility-administered medications for this visit.     OBJECTIVE: Middle-aged woman who appears stated age  72:   09/05/16 1020  BP: (!) 125/57  Pulse: 88  Resp: 18  Temp: 97.6 F (36.4 C)     Body mass index is 36.45 kg/m.    ECOG FS: 0  Sclerae unicteric, EOMs intact Oropharynx clear and moist No cervical or supraclavicular adenopathy Lungs no rales or rhonchi Heart regular rate and rhythm Abd soft, nontender, positive bowel sounds MSK no focal spinal tenderness, no upper extremity lymphedema Neuro: nonfocal, well oriented, appropriate affect Breasts: The right breast is status post lumpectomy followed by radiation. Aside from some scar tissue associated with the incision, there is stable, this  is unremarkable. The right axilla is benign. The left breast is unremarkable.  LAB RESULTS: Lab Results  Component Value Date   WBC 8.5 08/30/2016   NEUTROABS 5.1 08/30/2016   HGB 13.7 08/30/2016   HCT 41.6 08/30/2016   MCV 92.0 08/30/2016   PLT 281 08/30/2016      Chemistry      Component Value Date/Time   NA 141 08/30/2016 1412   K 4.1 08/30/2016 1412   CL 103 12/11/2011 0922   CO2 24 08/30/2016 1412   BUN 18.4 08/30/2016 1412   CREATININE 0.8 08/30/2016 1412      Component Value Date/Time   CALCIUM 10.2 08/30/2016 1412   ALKPHOS 83 08/30/2016 1412   AST 15 08/30/2016 1412   ALT 19 08/30/2016 1412   BILITOT 0.40 08/30/2016 1412       Lab Results  Component Value Date   LABCA2 32 05/24/2011    No components found for: [id number]  No results for input(s): INR in the last  168 hours.  Urinalysis    Component Value Date/Time   COLORURINE YELLOW 05/20/2008 [place] 05/20/2008 1548   LABSPEC 1.008 05/20/2008 1548   PHURINE 6.5 05/20/2008 1548   GLUCOSEU NEGATIVE 05/20/2008 1548   HGBUR NEGATIVE 05/20/2008 1548   BILIRUBINUR NEGATIVE 05/20/2008 1548   KETONESUR NEGATIVE 05/20/2008 1548   PROTEINUR NEGATIVE 05/20/2008 1548   UROBILINOGEN 0.2 05/20/2008 1548   NITRITE NEGATIVE 05/20/2008 1548   LEUKOCYTESUR  05/20/2008 1548    NEGATIVE MICROSCOPIC NOT DONE ON URINES WITH NEGATIVE PROTEIN, BLOOD, LEUKOCYTES, NITRITE, OR GLUCOSE <1000 mg/dL.    STUDIES: Study Result   CLINICAL DATA:  71 year old female presenting for re-evaluation of a palpable area of concern in the right breast. The patient has history of a right breast lumpectomy in 2009. She began feeling a palpable area in about December of 2016. She was evaluated at that time and the palpable area was determined to correspond with a dystrophic calcification.  EXAM: 2D DIGITAL DIAGNOSTIC UNILATERAL RIGHT MAMMOGRAM WITH CAD AND ADJUNCT TOMO  RIGHT BREAST ULTRASOUND  COMPARISON:   Previous exam(s).  ACR Breast Density Category c: The breast tissue is heterogeneously dense, which may obscure small masses.  FINDINGS: A palpable marker has been placed over the area of concern in the right breast. On the routine views, this marker immediately overlies a coarse dystrophic calcification from fat necrosis. No new suspicious calcifications, masses or areas of distortion are seen in the right breast.  Mammographic images were processed with CAD.  Physical exam of the palpable area of concern demonstrates a small firm lump at the scar in the central right breast at the site of the patient's nipple removal. The scar is well-healed. No abnormal skin changes are identified.  Ultrasound targeted to the palpable area of concern demonstrates a superficial echogenic rim with posterior shadowing corresponding with the dystrophic calcification identified mammographically. No soft tissue masses or suspicious areas of shadowing are identified adjacent to this calcification.  IMPRESSION: The palpable area of concern in the central right breast corresponds with a superficial benign dystrophic calcification. There is no mammographic or targeted sonographic evidence of right breast malignancy.  RECOMMENDATION: Continued screening mammography is recommended which will be due in November of 2017.  I have discussed the findings and recommendations with the patient. Results were also provided in writing at the conclusion of the visit. If applicable, a reminder letter will be sent to the patient regarding the next appointment.  BI-RADS CATEGORY  2: Benign.   Electronically Signed   By: Ammie Ferrier M.D.   On: 03/13/2016 10:10     ASSESSMENT: 71 y.o. [place] woman status post right lumpectomy and sentinel lymph node sampling 04/08/2008 for apT1c pN1, stage IIA invasive ductal carcinoma of overlapping sites, grade 2, estrogen receptor 99% and progesterone  receptor 51% positive, with an MIB-1 of 10%, and HER-2 amplification by CISH, with a ratio of 2.45.  (1) treated adjuvantly with carboplatin and Taxotere x2 cycles, poorly tolerated, followed by 4 cycles of weekly carboplatin Abraxane completed 07/21/2008  (2) adjuvant radiation therapy to the right breast completed 10/30/2008  (3) started on letrozole March of 2010, switched to anastrozole February of 2011 because of arthralgias/myalgias, anastrozole discontinued October 2016 when the patient was released from oncologic follow-up  (4) genetic testing sent 05/28/2014-- the following genes showed no abnormality: ATM, BARD1, BRCA1, BRCA2, BRIP1, CDH1, CHEK2, EPCAM, MLH1, MRE11A, MSH2, MSH6, MUTYH, NBN, NF1, PALB2, PMS2, PTEN, RAD50, RAD51C, RAD51D, SMARCA4, STK11, and  TP53.  (5) the patient is status post TAH/BSO remotely  PLAN:  Shelly Simpson is now 8-1/2 years out from definitive surgery for her breast cancer, with no evidence of disease recurrence. This is very favorable.  At this point I'm comfortable releasing her to her primary care physician. However she has some anxiety regarding her cancer and she also wishes to have periodic discussions of cancer issues since her daughter also has a history of breast cancer. She is interested in participating in our survivorship program and I have made her a return appointment with our survivorship nurse practitioner a year from now.  If she is not satisfied and still would like to see me on a regular basis we can certainly accommodate that.  Otherwise all she will need in terms of breast cancer follow-up is a yearly mammography and a yearly physician breast exam. At this point we are not making any further routine appointments for her here with me.   Shelly Simpson C    09/06/2016

## 2016-10-31 ENCOUNTER — Ambulatory Visit (INDEPENDENT_AMBULATORY_CARE_PROVIDER_SITE_OTHER): Payer: Self-pay

## 2016-10-31 ENCOUNTER — Ambulatory Visit (INDEPENDENT_AMBULATORY_CARE_PROVIDER_SITE_OTHER): Payer: BLUE CROSS/BLUE SHIELD | Admitting: Orthopaedic Surgery

## 2016-10-31 ENCOUNTER — Encounter (INDEPENDENT_AMBULATORY_CARE_PROVIDER_SITE_OTHER): Payer: Self-pay | Admitting: Orthopaedic Surgery

## 2016-10-31 VITALS — BP 129/81 | HR 86 | Resp 16 | Ht 69.0 in | Wt 220.0 lb

## 2016-10-31 DIAGNOSIS — M79641 Pain in right hand: Secondary | ICD-10-CM

## 2016-10-31 DIAGNOSIS — G8929 Other chronic pain: Secondary | ICD-10-CM | POA: Diagnosis not present

## 2016-10-31 DIAGNOSIS — M25561 Pain in right knee: Secondary | ICD-10-CM

## 2016-10-31 NOTE — Progress Notes (Signed)
Office Visit Note   Patient: Shelly Simpson           Date of Birth: 04-Nov-1945           MRN: [id number] Visit Date: 10/31/2016              Requested by: Foye Spurling, MD [address] [place], [place] [address] PCP: Foye Spurling, MD   Assessment & Plan: Visit Diagnoses: Hand pain without evidence of fracture. #2 contusion right knee  Plan:No Specific treatment as I believe this will resolve on its own without intervention.   Follow-Up Instructions: No Follow-up on file.   Orders:  No orders of the defined types were placed in this encounter.  No orders of the defined types were placed in this encounter.     Procedures: No procedures performed   Clinical Data: No additional findings.   Subjective: Chief Complaint  Patient presents with  . Right Knee - Pain    Pt presents with Right knee pain when she fell getting out of car 3 days ago. She relates the pain is on the patella, as she had the Right knee replaced 11 years ago.  She also states her right hand broke the fall.  [name] notes that she had a right total knee replacement over 10 years ago and is been doing well. He did have a fall as mentioned above mentioned directly in the anterior aspect of her right knee she has a small abrasion but otherwise doesn't have any particular problems. In addition she broke her fall with the right hand and has developed a little bit of swelling but without ecchymosis along the lateral aspect of her hand dorsally  Review of Systems   Objective: Vital Signs: There were no vitals taken for this visit.  Physical Exam  Ortho Exam right knee exam demonstrates a well-healed total knee incision. Directly over the patella was about a 1 cm abrasion that is quite superficial and healing without complication. No knee effusion. No instability. Fully quickly extend her knee. Minimal tenderness over the patella tendon but no induration or ecchymosis. No calf  pain. Flexed over 100 Right hand with mild edema slowly over the bases of the fourth and fifth metacarpals no induration or ecchymosis. Skin intact. Neurovascular exam intact. Able to make a full fist and release without problem  Specialty Comments:  No specialty comments available.  Imaging: No results found.   PMFS History: Patient Active Problem List   Diagnosis Date Noted  . Malignant neoplasm of overlapping sites of right breast in female, estrogen receptor positive ([place]) 06/25/2014  . History of breast cancer   . Family history of ovarian cancer   . Family history of breast cancer in female    Past Medical History:  Diagnosis Date  . Cancer ([place])   . Family history of breast cancer in female   . Family history of ovarian cancer   . History of breast cancer   . Hypertension     Family History  Problem Relation Age of Onset  . Ovarian cancer Mother 75    also uterine sarcoma  . Other Father   . Breast cancer Maternal Uncle     female breast cancer; deceased 38  . Breast cancer Cousin 64    Currently 66    Past Surgical History:  Procedure Laterality Date  . ABDOMINAL HYSTERECTOMY    . BREAST SURGERY    . JOINT REPLACEMENT  Social History   Occupational History  . Not on file.   Social History Main Topics  . Smoking status: Never Smoker  . Smokeless tobacco: Never Used  . Alcohol use No  . Drug use: No  . Sexual activity: Not on file

## 2016-11-07 ENCOUNTER — Ambulatory Visit (INDEPENDENT_AMBULATORY_CARE_PROVIDER_SITE_OTHER): Payer: BLUE CROSS/BLUE SHIELD | Admitting: Orthopaedic Surgery

## 2016-11-07 ENCOUNTER — Encounter (INDEPENDENT_AMBULATORY_CARE_PROVIDER_SITE_OTHER): Payer: Self-pay | Admitting: Orthopaedic Surgery

## 2016-11-07 VITALS — Resp 14 | Ht 69.0 in | Wt 200.0 lb

## 2016-11-07 DIAGNOSIS — S80211D Abrasion, right knee, subsequent encounter: Secondary | ICD-10-CM

## 2016-11-07 NOTE — Progress Notes (Signed)
   Office Visit Note   Patient: Shelly Simpson           Date of Birth: 1946/02/17           MRN: [id number] Visit Date: 11/07/2016              Requested by: Foye Spurling, MD [address] [place], [place] [address] PCP: Foye Spurling, MD   Assessment & Plan: Visit Diagnoses: Superficial abrasion anterior aspect right knee stable without infection   Plan:  mupropicin cream with Band-Aid until healed, follow-up as needed  Follow-Up Instructions: No Follow-up on file.   Orders:  No orders of the defined types were placed in this encounter.  No orders of the defined types were placed in this encounter.     Procedures: No procedures performed   Clinical Data: No additional findings.   Subjective: Chief Complaint  Patient presents with  . Right Knee - Pain    Shelly Simpson is here for a follow up of her right knee pain. She wants to make sure there is no infection as she states the scab "looks" infected. Denies any fever or chills, and relates she has good ROM in the right knee.    Review of Systems   Objective: Vital Signs: There were no vitals taken for this visit.  Physical Exam  Ortho Exam right knee has about a 1 cm abrasion over the very mid portion of her total knee incision centered over the patella there is no drainage. There is no erythema or local tenderness. No evidence of infection either superficially or deep. no knee effusion .full quick extension and flexion over 105 without instability.  Specialty Comments:  No specialty comments available.  Imaging: No results found.   PMFS History: Patient Active Problem List   Diagnosis Date Noted  . Malignant neoplasm of overlapping sites of right breast in female, estrogen receptor positive ([place]) 06/25/2014  . History of breast cancer   . Family history of ovarian cancer   . Family history of breast cancer in female    Past Medical History:  Diagnosis Date  . Cancer ([place])   .  Family history of breast cancer in female   . Family history of ovarian cancer   . History of breast cancer   . Hypertension     Family History  Problem Relation Age of Onset  . Ovarian cancer Mother 45    also uterine sarcoma  . Other Father   . Breast cancer Maternal Uncle     female breast cancer; deceased 91  . Breast cancer Cousin 65    Currently 66    Past Surgical History:  Procedure Laterality Date  . ABDOMINAL HYSTERECTOMY    . BREAST SURGERY    . JOINT REPLACEMENT     Social History   Occupational History  . Not on file.   Social History Main Topics  . Smoking status: Never Smoker  . Smokeless tobacco: Never Used  . Alcohol use No  . Drug use: No  . Sexual activity: Not on file

## 2017-03-07 ENCOUNTER — Other Ambulatory Visit: Payer: Self-pay | Admitting: Obstetrics and Gynecology

## 2017-03-07 DIAGNOSIS — E2839 Other primary ovarian failure: Secondary | ICD-10-CM

## 2017-05-09 DIAGNOSIS — M25561 Pain in right knee: Secondary | ICD-10-CM | POA: Diagnosis not present

## 2017-05-09 DIAGNOSIS — M79675 Pain in left toe(s): Secondary | ICD-10-CM | POA: Diagnosis not present

## 2017-05-28 ENCOUNTER — Ambulatory Visit (INDEPENDENT_AMBULATORY_CARE_PROVIDER_SITE_OTHER): Payer: BLUE CROSS/BLUE SHIELD | Admitting: Orthopaedic Surgery

## 2017-05-28 ENCOUNTER — Encounter (INDEPENDENT_AMBULATORY_CARE_PROVIDER_SITE_OTHER): Payer: Self-pay | Admitting: Orthopaedic Surgery

## 2017-05-28 VITALS — BP 141/82 | HR 90 | Resp 14 | Ht 66.0 in | Wt 200.0 lb

## 2017-05-28 DIAGNOSIS — G8929 Other chronic pain: Secondary | ICD-10-CM

## 2017-05-28 DIAGNOSIS — M79672 Pain in left foot: Secondary | ICD-10-CM

## 2017-05-28 DIAGNOSIS — M25562 Pain in left knee: Secondary | ICD-10-CM | POA: Diagnosis not present

## 2017-05-28 NOTE — Progress Notes (Signed)
Office Visit Note   Patient: Shelly Simpson           Date of Birth: 09-Aug-1946           MRN: [id number] Visit Date: 05/28/2017              Requested by: Foye Spurling, MD [address] [place], [place] [address] PCP: Foye Spurling, MD   Assessment & Plan: Visit Diagnoses:  1. Chronic pain of left knee   2. Left foot pain   nondisplaced fracture proximal phalanx left third toe.Exacerbation of pre-existing osteoarthritis left knee  Plan: continue with wooden shoe left foot until pain resolves. NSAIDs for knee pain. Consider cortisone injection at some point in the future  Follow-Up Instructions: Return if symptoms worsen or fail to improve.   Orders:  No orders of the defined types were placed in this encounter.  No orders of the defined types were placed in this encounter.     Procedures: No procedures performed   Clinical Data: No additional findings.   Subjective: Chief Complaint  Patient presents with  . Left Foot - Injury, Pain    Mrs. Younan is a 71 y o that is here for a fall  On 05/08/17.  She relates she fell down stairs and hurt her L foot and L knee.  . Left Knee - Pain, Edema, Numbness  initially seen at the after hours clinic and [facility]. Films on disc demonstrated a nondisplaced fracture to the waist of the proximal phalanx left third toe. Treated in a wooden shoe with good results. Also had an exacerbation of the arthritis of her left knee treated with anti-inflammatory medicines. Presently doing well.prior history of osteoarthritis right knee withnee replacement and doing well  HPI  Review of Systems  Constitutional: Negative for chills, fatigue and fever.  Eyes: Negative for itching.  Respiratory: Negative for chest tightness and shortness of breath.   Cardiovascular: Positive for leg swelling. Negative for chest pain and palpitations.  Gastrointestinal: Negative for blood in stool, constipation and diarrhea.    Endocrine: Negative for polyuria.  Genitourinary: Negative for dysuria.  Musculoskeletal: Negative for back pain, joint swelling, neck pain and neck stiffness.  Allergic/Immunologic: Negative for immunocompromised state.  Neurological: Negative for dizziness and numbness.  Hematological: Does not bruise/bleed easily.  Psychiatric/Behavioral: The patient is not nervous/anxious.      Objective: Vital Signs: BP (!) 141/82   Pulse 90   Resp 14   Ht 5\' 6"  (1.676 m)   Wt 200 lb (90.7 kg)   BMI 32.28 kg/m   Physical Exam  Ortho Examminimal swelling left third toe was some very minimal tenderness at the base of the toe over the proximal phalanx. No deformity. Skin intact. No ecchymosis. Left knee with lateral greater than medial joint pain. No effusion. Knee not hot warm red or swollen. Increased valgus with weightbeari.ng Some patellar crepitation. Full extension and flexion over 105. No calf pain. No popliteal pain. Neurovascular exam intact. Straight leg raise negative. Painless range of both hips  Specialty Comments:  No specialty comments available.  Imaging: No results found.   PMFS History: Patient Active Problem List   Diagnosis Date Noted  . Malignant neoplasm of overlapping sites of right breast in female, estrogen receptor positive ([place]) 06/25/2014  . History of breast cancer   . Family history of ovarian cancer   . Family history of breast cancer in female    Past Medical History:  Diagnosis Date  . Cancer ([place])   . Family history of breast cancer in female   . Family history of ovarian cancer   . History of breast cancer   . Hypertension     Family History  Problem Relation Age of Onset  . Ovarian cancer Mother 58       also uterine sarcoma  . Other Father   . Breast cancer Maternal Uncle        female breast cancer; deceased 75  . Breast cancer Cousin 33       Currently 66    Past Surgical History:  Procedure Laterality Date  . ABDOMINAL HYSTERECTOMY     . BREAST SURGERY    . JOINT REPLACEMENT     Social History   Occupational History  . Not on file.   Social History Main Topics  . Smoking status: Never Smoker  . Smokeless tobacco: Never Used  . Alcohol use No  . Drug use: No  . Sexual activity: Not on file

## 2017-06-12 ENCOUNTER — Encounter (INDEPENDENT_AMBULATORY_CARE_PROVIDER_SITE_OTHER): Payer: Self-pay | Admitting: Orthopaedic Surgery

## 2017-06-12 ENCOUNTER — Ambulatory Visit (INDEPENDENT_AMBULATORY_CARE_PROVIDER_SITE_OTHER): Payer: BLUE CROSS/BLUE SHIELD | Admitting: Orthopaedic Surgery

## 2017-06-12 VITALS — BP 124/61 | HR 91 | Resp 14 | Ht 66.0 in | Wt 200.0 lb

## 2017-06-12 DIAGNOSIS — M1712 Unilateral primary osteoarthritis, left knee: Secondary | ICD-10-CM | POA: Diagnosis not present

## 2017-06-12 MED ORDER — DICLOFENAC SODIUM 1 % TD GEL: 2.0000 g | Freq: Four times a day (QID) | TRANSDERMAL | 3 refills | Status: DC

## 2017-06-12 MED ORDER — BUPIVACAINE HCL 0.5 % IJ SOLN
3.0000 mL | INTRAMUSCULAR | Status: AC | PRN
  Administered 2017-06-12: 3 mL via INTRA_ARTICULAR

## 2017-06-12 MED ORDER — METHYLPREDNISOLONE ACETATE 40 MG/ML IJ SUSP
80.0000 mg | INTRAMUSCULAR | Status: AC | PRN
  Administered 2017-06-12: 80 mg

## 2017-06-12 MED ORDER — LIDOCAINE HCL 1 % IJ SOLN
5.0000 mL | INTRAMUSCULAR | Status: AC | PRN
  Administered 2017-06-12: 5 mL

## 2017-06-12 NOTE — Progress Notes (Signed)
Office Visit Note   Patient: Shelly Simpson           Date of Birth: [birth year]           MRN: [id number] Visit Date: 06/12/2017              Requested by: Foye Spurling, MD [address] [place], [place] [address] PCP: Foye Spurling, MD   Assessment & Plan: Visit Diagnoses:  1. Unilateral primary osteoarthritis, left knee     Plan: Cortisone injection left knee. Significantly improved after injection lobe as needed. Consider Visco supplementation  Follow-Up Instructions: Return if symptoms worsen or fail to improve.   Orders:  Orders Placed This Encounter  Procedures  . Large Joint Injection/Arthrocentesis   Meds ordered this encounter  Medications  . diclofenac sodium (VOLTAREN) 1 % GEL    Sig: Apply 2 g topically 4 (four) times daily.    Dispense:  1 Tube    Refill:  3      Procedures: Large Joint Inj Date/Time: 06/12/2017 3:40 PM Performed by: Garald Balding Authorized by: Garald Balding   Consent Given by:  Patient Timeout: prior to procedure the correct patient, procedure, and site was verified   Indications:  Pain and joint swelling Location:  Knee Site:  L knee Prep: patient was prepped and draped in usual sterile fashion   Needle Size:  25 G Needle Length:  1.5 inches Approach:  Anteromedial Ultrasound Guidance: No   Fluoroscopic Guidance: No   Arthrogram: No   Medications:  5 mL lidocaine 1 %; 80 mg methylPREDNISolone acetate 40 MG/ML; 3 mL bupivacaine 0.5 % Aspiration Attempted: No   Patient tolerance:  Patient tolerated the procedure well with no immediate complications     Clinical Data: No additional findings.   Subjective: Chief Complaint  Patient presents with  . Left Knee - Pain  [name] is accompanied by her husband and here for follow-up evaluation of the osteoarthritis of her left knee. She also is here for follow-up evaluation of the fracture involving the proximal phalanx of the left third  toe. She's doing well from that standpoint and wearing a regular shoe. She was over a month old. Still having trouble with her left knee with evidence of osteoarthritis by plain films performed at [facility] after hours clinic. I reviewed the films again with Mr. [name]. She has predominantly gender changes in the lateral compartment she does have tricompartmental changes area and she like to have a cortisone injection today  HPI  Review of Systems  Constitutional: Negative for chills, fatigue and fever.  Eyes: Negative for itching.  Respiratory: Negative for chest tightness and shortness of breath.   Cardiovascular: Negative for chest pain, palpitations and leg swelling.  Gastrointestinal: Negative for blood in stool, constipation and diarrhea.  Endocrine: Negative for polyuria.  Genitourinary: Negative for dysuria.  Musculoskeletal: Negative for back pain, joint swelling, neck pain and neck stiffness.  Allergic/Immunologic: Negative for immunocompromised state.  Neurological: Negative for dizziness and numbness.  Hematological: Does not bruise/bleed easily.  Psychiatric/Behavioral: Positive for sleep disturbance. The patient is not nervous/anxious.      Objective: Vital Signs: BP 124/61   Pulse 91   Resp 14   Ht 5\' 6"  (1.676 m)   Wt 200 lb (90.7 kg)   BMI 32.28 kg/m   Physical Exam  Ortho Exam left knee was not hot warm red or swollen. Slight increased valgus with weightbearing. Predominantly medial  joint pain. Some patellar crepitation. No effusion. No instability. No Pain or leg raise negative. No pain range of motion of either hip. Right total knee replacement without problem  Specialty Comments:  No specialty comments available.  Imaging: No results found.   PMFS History: Patient Active Problem List   Diagnosis Date Noted  . Unilateral primary osteoarthritis, left knee 06/12/2017  . Malignant neoplasm of overlapping sites of right breast in female, estrogen  receptor positive ([place]) 06/25/2014  . History of breast cancer   . Family history of ovarian cancer   . Family history of breast cancer in female    Past Medical History:  Diagnosis Date  . Cancer ([facility])   . Family history of breast cancer in female   . Family history of ovarian cancer   . History of breast cancer   . Hypertension     Family History  Problem Relation Age of Onset  . Ovarian cancer Mother 54       also uterine sarcoma  . Other Father   . Breast cancer Maternal Uncle        female breast cancer; deceased 39  . Breast cancer Cousin 62       Currently 66    Past Surgical History:  Procedure Laterality Date  . ABDOMINAL HYSTERECTOMY    . BREAST SURGERY    . JOINT REPLACEMENT     Social History   Occupational History  . Not on file.   Social History Main Topics  . Smoking status: Never Smoker  . Smokeless tobacco: Never Used  . Alcohol use No  . Drug use: No  . Sexual activity: Not on file

## 2017-06-20 ENCOUNTER — Telehealth: Payer: Self-pay

## 2017-06-20 NOTE — Telephone Encounter (Signed)
[facility] pharmacy called to check the status for PA for Voltaren Gel.  Please advise.  Thank You.

## 2017-06-25 ENCOUNTER — Other Ambulatory Visit: Payer: Self-pay | Admitting: Oncology

## 2017-06-25 DIAGNOSIS — Z1231 Encounter for screening mammogram for malignant neoplasm of breast: Secondary | ICD-10-CM

## 2017-07-12 DIAGNOSIS — G40309 Generalized idiopathic epilepsy and epileptic syndromes, not intractable, without status epilepticus: Secondary | ICD-10-CM | POA: Diagnosis not present

## 2017-07-12 DIAGNOSIS — I10 Essential (primary) hypertension: Secondary | ICD-10-CM | POA: Diagnosis not present

## 2017-07-12 DIAGNOSIS — E119 Type 2 diabetes mellitus without complications: Secondary | ICD-10-CM | POA: Diagnosis not present

## 2017-07-12 DIAGNOSIS — J0101 Acute recurrent maxillary sinusitis: Secondary | ICD-10-CM | POA: Diagnosis not present

## 2017-07-25 ENCOUNTER — Other Ambulatory Visit: Payer: Self-pay | Admitting: Obstetrics and Gynecology

## 2017-07-25 ENCOUNTER — Ambulatory Visit
Admission: RE | Admit: 2017-07-25 | Discharge: 2017-07-25 | Disposition: A | Discharge status: Home / Self Care | Payer: BLUE CROSS/BLUE SHIELD | Source: Ambulatory Visit | Attending: Oncology | Admitting: Oncology

## 2017-07-25 DIAGNOSIS — N6310 Unspecified lump in the right breast, unspecified quadrant: Secondary | ICD-10-CM

## 2017-07-25 DIAGNOSIS — Z1231 Encounter for screening mammogram for malignant neoplasm of breast: Secondary | ICD-10-CM

## 2017-07-26 ENCOUNTER — Other Ambulatory Visit: Payer: Self-pay | Admitting: Oncology

## 2017-07-26 DIAGNOSIS — N6310 Unspecified lump in the right breast, unspecified quadrant: Secondary | ICD-10-CM

## 2017-08-02 ENCOUNTER — Other Ambulatory Visit: Payer: BLUE CROSS/BLUE SHIELD

## 2017-08-08 ENCOUNTER — Ambulatory Visit
Admission: RE | Admit: 2017-08-08 | Discharge: 2017-08-08 | Disposition: A | Discharge status: Home / Self Care | Payer: BLUE CROSS/BLUE SHIELD | Source: Ambulatory Visit | Attending: Adult Health | Admitting: Adult Health

## 2017-08-08 DIAGNOSIS — N6310 Unspecified lump in the right breast, unspecified quadrant: Secondary | ICD-10-CM

## 2017-08-08 DIAGNOSIS — N6489 Other specified disorders of breast: Secondary | ICD-10-CM | POA: Diagnosis not present

## 2017-08-08 DIAGNOSIS — R922 Inconclusive mammogram: Secondary | ICD-10-CM | POA: Diagnosis not present

## 2017-09-03 ENCOUNTER — Other Ambulatory Visit: Payer: Self-pay | Admitting: *Deleted

## 2017-09-04 ENCOUNTER — Other Ambulatory Visit: Payer: BLUE CROSS/BLUE SHIELD

## 2017-09-04 ENCOUNTER — Inpatient Hospital Stay: Payer: BLUE CROSS/BLUE SHIELD | Attending: Oncology

## 2017-09-04 DIAGNOSIS — Z923 Personal history of irradiation: Secondary | ICD-10-CM | POA: Insufficient documentation

## 2017-09-04 DIAGNOSIS — Z9221 Personal history of antineoplastic chemotherapy: Secondary | ICD-10-CM | POA: Diagnosis not present

## 2017-09-04 DIAGNOSIS — C50811 Malignant neoplasm of overlapping sites of right female breast: Secondary | ICD-10-CM

## 2017-09-04 DIAGNOSIS — Z853 Personal history of malignant neoplasm of breast: Secondary | ICD-10-CM | POA: Insufficient documentation

## 2017-09-04 DIAGNOSIS — Z170 Estrogen receptor positive status [ER+]: Secondary | ICD-10-CM

## 2017-09-04 LAB — COMPREHENSIVE METABOLIC PANEL
ALT: 22 U/L (ref 0–55)
AST: 14 U/L (ref 5–34)
Albumin: 4 g/dL (ref 3.5–5.0)
Alkaline Phosphatase: 70 U/L (ref 40–150)
Anion gap: 9 (ref 3–11)
BUN: 15 mg/dL (ref 7–26)
CHLORIDE: 104 mmol/L (ref 98–109)
CO2: 26 mmol/L (ref 22–29)
CREATININE: 0.96 mg/dL (ref 0.60–1.10)
Calcium: 9.8 mg/dL (ref 8.4–10.4)
GFR calc Af Amer: >60 mL/min (ref >60)
GFR calc non Af Amer: 59 mL/min — ABNORMAL LOW (ref >60)
Glucose, Bld: 156 mg/dL — ABNORMAL HIGH (ref 70–140)
Potassium: 4.1 mmol/L (ref 3.3–4.7)
SODIUM: 139 mmol/L (ref 136–145)
Total Bilirubin: 0.4 mg/dL (ref 0.2–1.2)
Total Protein: 6.8 g/dL (ref 6.4–8.3)

## 2017-09-04 LAB — CBC WITH DIFFERENTIAL/PLATELET
Abs Granulocyte: 4.1 10*3/uL (ref 1.5–6.5)
BASOS ABS: 0.1 10*3/uL (ref 0.0–0.1)
Basophils Relative: 1 %
EOS ABS: 0.1 10*3/uL (ref 0.0–0.5)
EOS PCT: 2 %
HCT: 43.1 % (ref 34.8–46.6)
Hemoglobin: 14.1 g/dL (ref 11.6–15.9)
LYMPHS PCT: 31 %
Lymphs Abs: 2.1 10*3/uL (ref 0.9–3.3)
MCH: 30.9 pg (ref 25.1–34.0)
MCHC: 32.6 g/dL (ref 31.5–36.0)
MCV: 94.6 fL (ref 79.5–101.0)
Monocytes Absolute: 0.5 10*3/uL (ref 0.1–0.9)
Monocytes Relative: 7 %
NEUTROS ABS: 4.1 10*3/uL (ref 1.5–6.5)
Neutrophils Relative %: 59 %
PLATELETS: 231 10*3/uL (ref 145–400)
RBC: 4.56 MIL/uL (ref 3.70–5.45)
RDW: 12.3 % (ref 11.2–16.1)
WBC: 6.9 10*3/uL (ref 3.9–10.3)

## 2017-09-04 NOTE — Progress Notes (Signed)
ID: Shelly Simpson   DOB: December 14, 1945  MR#: [id number]  CSN#:[id number]  PCP: Foye Spurling, MD GYN:  SU: [facility] OTHER MD: Kyung Rudd  Chief complaint: Estrogen receptor positive breast cancer  Current treatment: Observation   BREAST CANCER HISTORY:  From Dr. Collier Salina Rubin's noted 04/29/2008:  She underwent a screening mammogram in January.  She has had previous six monthly screening mammogram for evaluation of benign appearing calcifications.  She subsequently had a six-month followup mammogram in July 16th, 2009.  This showed indeterminate calcifications in the retroareolar right breast.  Biopsy was recommended.  Invasive ductal cancer associated with DCIS was noted.  The invasive component was ER and PR positive at 99% and 51% respectively, HER-2 was 2+, and proliferative index was 10 %.  The patient had an MRI scan on March 19, 2008.  This showed a 1 x 1 x 0.8 cm area of DCIS with small amount of invasive cancer 6 o'clock position.  The other exam is otherwise unremarkable.  The patient elected to undergo a lumpectomy and sentinel lymph node evaluation on 04/08/2008.  Final pathology showed invasive ductal cancer grade 2 of 3 measuring 1.2 cm.  The invasive component was less than 0.1 cm to the deep margin.  Additional margin was removed, which showed invasive cancer with DCIS focally less than 0.1 cm with a new margin.  Two sentinel lymph nodes were identified, one sentinel lymph node had micro metastases noted.  Of note is that the final FISH testing was performed on the primary tumor, which was shown to be positive for amplification in the ratio of 2.45  Subsequent history is as detailed below.  INTERVAL HISTORY: Shelly Simpson returns today for follow-up of her history of estrogen receptor positive breast cancer.  She continues under observation alone. She reports that she is doing well overall.  Since her last visit, she underwent diagnostic bilateral mammography with CAD and tomography  and right breat ultrasonography on 08/08/2017 at [place] showing: breast density category C. Palpable abnormality corresponds with a benign dystrophic calcification. There was no evidence of malignancy.    REVIEW OF SYSTEMS: Shelly Simpson reports that she felt a change in her scar on her right side. She notes that her holidays were good. She reports that her oldest daughter was diagnosed with breast cancer, which was found early, and her daughter is being treated right now. She is recommending her youngest daughter to start getting routine mammograms, as she is now 51. She reports that her daughter underwent a traumatic experience, so she has been helping her through an emotional time. She notes that she has temporarily stopped working out at [employer], but while she was there, she did water and machine exercises. She also got on a stationary bike. She reports that she had an eye infection that she was prescribed eye drops for. She denies unusual headaches, visual changes, nausea, vomiting, or dizziness. There has been no unusual cough, phlegm production, or pleurisy. This been no change in bowel or bladder habits. She denies unexplained fatigue or unexplained weight loss, bleeding, rash, or fever. A detailed review of systems was otherwise stable.    PAST MEDICAL HISTORY: Past Medical History:  Diagnosis Date  . Cancer ([place])   . Family history of breast cancer in female   . Family history of ovarian cancer   . History of breast cancer   . Hypertension    Seizures, hypertension, hypercholesterolemia  PAST SURGICAL HISTORY: Past Surgical History:  Procedure Laterality  Date  . ABDOMINAL HYSTERECTOMY    . BREAST SURGERY    . JOINT REPLACEMENT     Status post hysterectomy with bilateral stalpingo- oophorectomy, status post laminectomy, status post tonsillectomy and adenoidectomy, status post knee surgery  FAMILY HISTORY Family History  Problem Relation Age of Onset  . Ovarian cancer Mother 1        also uterine sarcoma  . Breast cancer Daughter 70  . Other Father   . Breast cancer Maternal Uncle        female breast cancer; deceased 44  . Breast cancer Cousin 69       Currently 46   The patient's father died in his 33s from congestive heart failure and what sounds like a pulmonary embolus. The patient's mother died at the age of 40, from ovarian cancer, which was diagnosed shortly before her birth. Also the patient's mother is brother had a diagnosis of breast cancer, late in life.   GYNECOLOGIC HISTORY: Menarche age 32, first live birth age 54, she is GX P2, undergoing hysterectomy in 48. She took hormone replacement for approximately 16 years.  SOCIAL HISTORY: She used to work for [employer]. Her husband Shelly Simpson is also retired. Daughter Shelly Simpson works in [place] as a Audiological scientist; daughter Shelly Simpson teaches biology in college; she has a PhD degree. The patient has 1 grandchild.   ADVANCED DIRECTIVES: Not in place  HEALTH MAINTENANCE: Social History   Tobacco Use  . Smoking status: Never Smoker  . Smokeless tobacco: Never Used  Substance Use Topics  . Alcohol use: No  . Drug use: No     Allergies  Allergen Reactions  . Iohexol      Desc: pt states in '80's wheezing; dyspnea w/ contrast--pt needs pre meds in future; slg 04/24/2008   . Sulfa Antibiotics     Current Outpatient Medications  Medication Sig Dispense Refill  . albuterol (PROAIR HFA) 108 (90 BASE) MCG/ACT inhaler Inhale 2 puffs into the lungs every 6 (six) hours as needed. (Patient not taking: Reported on 05/28/2017) 3 Inhaler 0  . atorvastatin (LIPITOR) 10 MG tablet     . calcium citrate-vitamin D (CITRACAL+D) 315-200 MG-UNIT per tablet Take 1 tablet by mouth 2 (two) times daily.    . cetirizine (ZYRTEC) 10 MG tablet Take 10 mg by mouth daily as needed.     . cloNIDine (CATAPRES) 0.1 MG tablet Take 0.1 mg by mouth daily.     . diclofenac sodium (VOLTAREN) 1 % GEL Apply 2 g topically 4  (four) times daily. 1 Tube 3  . fluticasone (FLONASE) 50 MCG/ACT nasal spray Place 2 sprays into the nose daily as needed.     . irbesartan-hydrochlorothiazide (AVALIDE) 150-12.5 MG tablet     . metFORMIN (GLUCOPHAGE-XR) 500 MG 24 hr tablet     . Multiple Vitamin (MULTIVITAMIN) capsule Take 1 capsule by mouth daily.    Marland Kitchen nystatin-triamcinolone (MYCOLOG II) cream     . olmesartan-hydrochlorothiazide (BENICAR HCT) 20-12.5 MG per tablet Take 1 tablet by mouth daily.    . phenytoin (DILANTIN) 100 MG ER capsule Take 300 mg by mouth 3 (three) times daily.     Marland Kitchen tobramycin-dexamethasone (TOBRADEX) ophthalmic solution      No current facility-administered medications for this visit.     OBJECTIVE: Middle-aged woman in no acute distress  Vitals:   09/06/17 1036  BP: (!) 131/57  Pulse: 96  Resp: 17  Temp: 98.6 F (37 C)  SpO2: 98%  Body mass index is 37.53 kg/m.    ECOG FS: 0  Sclerae unicteric, pupils round and equal Oropharynx clear and moist No cervical or supraclavicular adenopathy Lungs no rales or rhonchi Heart regular rate and rhythm Abd soft, bees, nontender, positive bowel sounds MSK no focal spinal tenderness, no upper extremity lymphedema Neuro: nonfocal, well oriented, appropriate affect Breasts: The right breast is status post lumpectomy and radiation.  Right on the incision scar more medially than laterally there is a palpable two thirds of the centimeter bump which is going to be scar tissue.  Of course she recently had mammography which showed no evidence of cancer.  This is not tender or erythematous.  The left breast is unremarkable.  Both axillae are benign.  Right breast imaged 09/06/2017    LAB RESULTS: Lab Results  Component Value Date   WBC 6.9 09/04/2017   NEUTROABS 4.1 09/04/2017   HGB 14.1 09/04/2017   HCT 43.1 09/04/2017   MCV 94.6 09/04/2017   PLT 231 09/04/2017      Chemistry      Component Value Date/Time   NA 139 09/04/2017 1015   NA 141  08/30/2016 1412   K 4.1 09/04/2017 1015   K 4.1 08/30/2016 1412   CL 104 09/04/2017 1015   CO2 26 09/04/2017 1015   CO2 24 08/30/2016 1412   BUN 15 09/04/2017 1015   BUN 18.4 08/30/2016 1412   CREATININE 0.96 09/04/2017 1015   CREATININE 0.8 08/30/2016 1412      Component Value Date/Time   CALCIUM 9.8 09/04/2017 1015   CALCIUM 10.2 08/30/2016 1412   ALKPHOS 70 09/04/2017 1015   ALKPHOS 83 08/30/2016 1412   AST 14 09/04/2017 1015   AST 15 08/30/2016 1412   ALT 22 09/04/2017 1015   ALT 19 08/30/2016 1412   BILITOT 0.4 09/04/2017 1015   BILITOT 0.40 08/30/2016 1412       Lab Results  Component Value Date   LABCA2 32 05/24/2011    No components found for: [id number]  No results for input(s): INR in the last 168 hours.  Urinalysis    Component Value Date/Time   COLORURINE YELLOW 05/20/2008 [place] 05/20/2008 1548   LABSPEC 1.008 05/20/2008 1548   PHURINE 6.5 05/20/2008 1548   GLUCOSEU NEGATIVE 05/20/2008 1548   HGBUR NEGATIVE 05/20/2008 1548   BILIRUBINUR NEGATIVE 05/20/2008 1548   KETONESUR NEGATIVE 05/20/2008 1548   PROTEINUR NEGATIVE 05/20/2008 1548   UROBILINOGEN 0.2 05/20/2008 1548   NITRITE NEGATIVE 05/20/2008 1548   LEUKOCYTESUR  05/20/2008 1548    NEGATIVE MICROSCOPIC NOT DONE ON URINES WITH NEGATIVE PROTEIN, BLOOD, LEUKOCYTES, NITRITE, OR GLUCOSE <1000 mg/dL.    STUDIES: Since her last visit, she underwent diagnostic bilateral mammography with CAD and tomography and right breat ultrasonography on 08/08/2017 at [place] showing: breast density category C. Palpable abnormality corresponds with a benign dystrophic calcification. There was no evidence of malignancy.    ASSESSMENT: 72 y.o. [place] woman status post right lumpectomy and sentinel lymph node sampling 04/08/2008 for a pT1c pN1, stage IIA invasive ductal carcinoma of overlapping sites, grade 2, estrogen receptor 99% and progesterone receptor 51% positive, with an MIB-1 of  10%, and HER-2 amplification by CISH, with a ratio of 2.45.  (1) treated adjuvantly with carboplatin and Taxotere x2 cycles, poorly tolerated, followed by 4 cycles of weekly carboplatin Abraxane completed 07/21/2008  (a) received trastuzumab starting with chemotherapy, completing a year (see Dr Julien Girt note 12/11/2011)  (2) adjuvant  radiation therapy to the right breast completed 10/30/2008  (3) started on letrozole March of 2010, switched to anastrozole February of 2011 because of arthralgias/myalgias, anastrozole discontinued October 2016 when the patient was released from oncologic follow-up  (4) genetic testing sent 05/28/2014-- the following genes showed no abnormality: ATM, BARD1, BRCA1, BRCA2, BRIP1, CDH1, CHEK2, EPCAM, MLH1, MRE11A, MSH2, MSH6, MUTYH, NBN, NF1, PALB2, PMS2, PTEN, RAD50, RAD51C, RAD51D, SMARCA4, STK11, and TP53.  (5) the patient is status post TAH/BSO remotely  PLAN:  Stanton Kidney will be 10 years out from definitive surgery for her breast cancer August of this year.  I told her it was time to celebrate.  Nevertheless she remains very anxious regarding the possibility of recurrence.  She was worried about her margins because of a friend who recently had disease recurrence after having close margins.  I gave her a copy of her pathology report which does show close margins both for the invasive and noninvasive components.  I reassured her that we now know that radiation clears the margins for noninvasive disease very reliably.  In short she did not need further surgery at the time.  She is also worried because she was not able to tolerate the initial chemotherapy that was proposed.  She did have however adequate chemotherapy and more importantly she had trastuzumab for one year.  I am delighted that she is doing so well.  As far as the scar tissue on her right breast, if that changes she will let me know.  Otherwise I will see her again in 1 year.  He knows to call for any other  issues that may develop   Magrinat, Virgie Dad, MD  09/06/17 10:49 AM Medical Oncology and Hematology [facility] [address] [place], [place] [address] Tel. [phone]    Fax. [phone]  This document serves as a record of services personally performed by Lurline Del, MD. It was created on his behalf by Sheron Nightingale, a trained medical scribe. The creation of this record is based on the scribe's personal observations and the provider's statements to them.   I have reviewed the above documentation for accuracy and completeness, and I agree with the above.

## 2017-09-06 ENCOUNTER — Inpatient Hospital Stay (HOSPITAL_BASED_OUTPATIENT_CLINIC_OR_DEPARTMENT_OTHER): Payer: BLUE CROSS/BLUE SHIELD | Admitting: Oncology

## 2017-09-06 ENCOUNTER — Ambulatory Visit: Payer: BLUE CROSS/BLUE SHIELD | Admitting: Oncology

## 2017-09-06 VITALS — BP 131/57 | HR 96 | Temp 98.6°F | Resp 17 | Wt 232.5 lb

## 2017-09-06 DIAGNOSIS — Z853 Personal history of malignant neoplasm of breast: Secondary | ICD-10-CM

## 2017-09-06 DIAGNOSIS — C50811 Malignant neoplasm of overlapping sites of right female breast: Secondary | ICD-10-CM

## 2017-09-06 DIAGNOSIS — Z170 Estrogen receptor positive status [ER+]: Secondary | ICD-10-CM | POA: Diagnosis not present

## 2017-09-06 DIAGNOSIS — Z9221 Personal history of antineoplastic chemotherapy: Secondary | ICD-10-CM | POA: Diagnosis not present

## 2017-09-06 DIAGNOSIS — Z923 Personal history of irradiation: Secondary | ICD-10-CM | POA: Diagnosis not present

## 2017-09-10 ENCOUNTER — Telehealth: Payer: Self-pay | Admitting: Oncology

## 2017-09-10 NOTE — Telephone Encounter (Signed)
Scheduled appt per 1/10 los- reminder letter sent in the mail./

## 2017-10-09 NOTE — Progress Notes (Signed)
Chief Complaint  Patient presents with  . New Patient (Initial Visit)    establish care, cold sxs x 1 week  . Rash    left abdominal area, noticed on yesterday, not itchy    HPI  Upper respiratory infection Patient reports that she has been having cough that is productive of phlegm that has a yellow tinge to it and is slimy She is drinking plenty of fluids She has tried mucinex over the counter which seemed to help No fevers or chills No shortness of breath or wheezing She is flying in 3 days She states that she will be visiting her 71 yo grandson He is in daycare with other children so she also gets sick when she goes to visit She states that she uses an albuterol inhaler when she gets a cold and has coughing  She reports that she was previously prescribed albuterol sulfate  Hypertension: Patient here for follow-up of elevated blood pressure. She is exercising and is adherent to low salt diet.  Blood pressure is well controlled at home. Cardiac symptoms none. Patient denies chest pain, chest pressure/discomfort, claudication, dyspnea, exertional chest pressure/discomfort, fatigue and irregular heart beat.  Cardiovascular risk factors: hypertension.  BP Readings from Last 3 Encounters:  10/10/17 119/74  09/06/17 (!) 131/57  06/12/17 124/61   Seizure Disorder She reports that she is on metformin to prevent hyperglycemia on phenytoin She has been on the phenytoin for more than 10 years She states that she was told not to stop the seizure medication due to withdrawal  Skin lesion on the abdomen She reports that she has noticed a circular lesion in the past 2 days She reports that it is not itchy    Past Medical History:  Diagnosis Date  . Allergy   . Cancer ([place])   . Family history of breast cancer in female   . Family history of ovarian cancer   . History of breast cancer   . Hypertension   . Seizures ([place])     Current Outpatient Medications  Medication Sig Dispense  Refill  . atorvastatin (LIPITOR) 10 MG tablet Take 1 tablet (10 mg total) by mouth daily at 6 PM. 90 tablet 1  . fluticasone (FLONASE) 50 MCG/ACT nasal spray Place 2 sprays into the nose daily as needed.     . metFORMIN (GLUCOPHAGE-XR) 500 MG 24 hr tablet Take 1 tablet (500 mg total) by mouth daily with breakfast. 90 tablet 1  . Multiple Vitamin (MULTIVITAMIN) capsule Take 1 capsule by mouth daily.    . phenytoin (DILANTIN) 100 MG ER capsule Take 3 capsules (300 mg total) by mouth at bedtime. 270 capsule 3  . albuterol (PROAIR HFA) 108 (90 Base) MCG/ACT inhaler Inhale 2 puffs into the lungs every 6 (six) hours as needed. 1 Inhaler 2  . calcium citrate-vitamin D (CITRACAL+D) 315-200 MG-UNIT per tablet Take 1 tablet by mouth 2 (two) times daily.    . cetirizine (ZYRTEC) 10 MG tablet Take 10 mg by mouth daily as needed.     Marland Kitchen olmesartan-hydrochlorothiazide (BENICAR HCT) 20-12.5 MG tablet Take 1 tablet by mouth daily. 90 tablet 1   No current facility-administered medications for this visit.     Allergies:  Allergies  Allergen Reactions  . Iohexol      Desc: pt states in '80's wheezing; dyspnea w/ contrast--pt needs pre meds in future; slg 04/24/2008   . Sulfa Antibiotics     Past Surgical History:  Procedure Laterality Date  . ABDOMINAL HYSTERECTOMY    .  BREAST SURGERY    . JOINT REPLACEMENT    . SPINE SURGERY     laminectomy    Social History   Socioeconomic History  . Marital status: Married    Spouse name: Not on file  . Number of children: Not on file  . Years of education: Not on file  . Highest education level: Not on file  Social Needs  . Financial resource strain: Not on file  . Food insecurity - worry: Not on file  . Food insecurity - inability: Not on file  . Transportation needs - medical: Not on file  . Transportation needs - non-medical: Not on file  Occupational History  . Not on file  Tobacco Use  . Smoking status: Never Smoker  . Smokeless tobacco: Never  Used  Substance and Sexual Activity  . Alcohol use: No  . Drug use: No  . Sexual activity: Not on file  Other Topics Concern  . Not on file  Social History Narrative  . Not on file    Family History  Problem Relation Age of Onset  . Ovarian cancer Mother 45       also uterine sarcoma  . Breast cancer Daughter 21  . Other Father   . Breast cancer Maternal Uncle        female breast cancer; deceased 64  . Breast cancer Cousin 76       Currently 24  . Cancer Maternal Grandmother   . Heart disease Maternal Grandfather   . Hypertension Maternal Grandfather   . Hypertension Paternal Grandfather      ROS Review of Systems See HPI Constitution: No fevers or chills No malaise No diaphoresis Skin: No rash or itching Eyes: no blurry vision, no double vision GU: no dysuria or hematuria Neuro: no dizziness or headaches, no seizures all others reviewed and negative   Objective: Vitals:   10/10/17 1043  BP: 119/74  Pulse: 98  Resp: 16  Temp: 98.8 F (37.1 C)  TempSrc: Oral  SpO2: 98%  Weight: 225 lb 12.8 oz (102.4 kg)  Height: 5' 6.5" (1.689 m)    Physical Exam  Constitutional: She is oriented to person, place, and time.  Neurological: She is alert and oriented to person, place, and time.   General: alert, oriented, in NAD Head: normocephalic, atraumatic, no sinus tenderness Eyes: EOM intact, no scleral icterus or conjunctival injection Ears: TM clear bilaterally Nose: mucosa +erythematous, +edematous Throat: no pharyngeal exudate or erythema Lymph: no posterior auricular, submental or cervical lymph adenopathy Heart: normal rate, normal sinus rhythm, no murmurs Lungs: clear to auscultation bilaterally, no wheezing Abdomen: nondistended Normoactive bs Soft  nontender  Assessment and Plan Sharleen was seen today for new patient (initial visit) and rash.  Diagnoses and all orders for this visit:  Acute URI- supportive care with flonase and  antihistamine  Seizure disorder ([place])-  Advised continue phenytoin -     Phenytoin level, free and total; Future  Encounter for medication monitoring -     CBC; Future -     Comprehensive metabolic panel; Future -     Lipid panel; Future -     TSH; Future -     POCT glycosylated hemoglobin (Hb A1C); Future -     Phenytoin level, free and total; Future  Impaired fasting glucose- will monitor since phenytoin and lipitor and hctz can raise blood glucose -     POCT glycosylated hemoglobin (Hb A1C); Future  Essential hypertension- stable on current bp medications -  Comprehensive metabolic panel; Future -     Lipid panel; Future  Malignant neoplasm of female breast [facility])- discussed her phenytoin -     phenytoin (DILANTIN) 100 MG ER capsule; Take 3 capsules (300 mg total) by mouth at bedtime.  Vitamin D deficiency- advised pt to continue her dilantin -     phenytoin (DILANTIN) 100 MG ER capsule; Take 3 capsules (300 mg total) by mouth at bedtime.  Establishing care with new doctor, encounter for  Other orders -     Cancel: Flu Vaccine QUAD 36+ mos IM -     albuterol (PROAIR HFA) 108 (90 Base) MCG/ACT inhaler; Inhale 2 puffs into the lungs every 6 (six) hours as needed. -     olmesartan-hydrochlorothiazide (BENICAR HCT) 20-12.5 MG tablet; Take 1 tablet by mouth daily. -     atorvastatin (LIPITOR) 10 MG tablet; Take 1 tablet (10 mg total) by mouth daily at 6 PM. -     metFORMIN (GLUCOPHAGE-XR) 500 MG 24 hr tablet; Take 1 tablet (500 mg total) by mouth daily with breakfast.     Zhaire Locker A Nolon Rod

## 2017-10-10 ENCOUNTER — Ambulatory Visit (INDEPENDENT_AMBULATORY_CARE_PROVIDER_SITE_OTHER): Payer: BLUE CROSS/BLUE SHIELD | Admitting: Family Medicine

## 2017-10-10 ENCOUNTER — Other Ambulatory Visit: Payer: Self-pay

## 2017-10-10 ENCOUNTER — Encounter: Payer: Self-pay | Admitting: Family Medicine

## 2017-10-10 VITALS — BP 119/74 | HR 98 | Temp 98.8°F | Resp 16 | Ht 66.5 in | Wt 225.8 lb

## 2017-10-10 DIAGNOSIS — J069 Acute upper respiratory infection, unspecified: Secondary | ICD-10-CM | POA: Diagnosis not present

## 2017-10-10 DIAGNOSIS — G40909 Epilepsy, unspecified, not intractable, without status epilepticus: Secondary | ICD-10-CM

## 2017-10-10 DIAGNOSIS — R7301 Impaired fasting glucose: Secondary | ICD-10-CM

## 2017-10-10 DIAGNOSIS — I10 Essential (primary) hypertension: Secondary | ICD-10-CM

## 2017-10-10 DIAGNOSIS — Z7689 Persons encountering health services in other specified circumstances: Secondary | ICD-10-CM

## 2017-10-10 DIAGNOSIS — E559 Vitamin D deficiency, unspecified: Secondary | ICD-10-CM | POA: Diagnosis not present

## 2017-10-10 DIAGNOSIS — Z5181 Encounter for therapeutic drug level monitoring: Secondary | ICD-10-CM

## 2017-10-10 MED ORDER — ATORVASTATIN CALCIUM 10 MG PO TABS: 10.0000 mg | ORAL_TABLET | Freq: Every day | ORAL | 1 refills | Status: DC

## 2017-10-10 MED ORDER — PHENYTOIN SODIUM EXTENDED 100 MG PO CAPS: 300.0000 mg | ORAL_CAPSULE | Freq: Every day | ORAL | 3 refills | Status: DC

## 2017-10-10 MED ORDER — ALBUTEROL SULFATE HFA 108 (90 BASE) MCG/ACT IN AERS: 2.0000 | INHALATION_SPRAY | Freq: Four times a day (QID) | RESPIRATORY_TRACT | 2 refills | Status: DC | PRN

## 2017-10-10 MED ORDER — OLMESARTAN MEDOXOMIL-HCTZ 20-12.5 MG PO TABS: 1.0000 | ORAL_TABLET | Freq: Every day | ORAL | 1 refills | Status: DC

## 2017-10-10 MED ORDER — METFORMIN HCL ER 500 MG PO TB24: 500.0000 mg | ORAL_TABLET | Freq: Every day | ORAL | 1 refills | Status: DC

## 2017-10-10 NOTE — Patient Instructions (Addendum)
Follow up for blood testing in April with office visit in May   ------------------------------------------------------- Decongestants  You should not take Dextromorphan or Phenylephrine These are common found in decongestant and cold medications This can cause very high pulses and high blood pressure.  Ask you pharmacist to make sure this ingredient is not present    IF you received an x-ray today, you will receive an invoice from [facility]. Please contact [facility] at [phone] with questions or concerns regarding your invoice.   IF you received labwork today, you will receive an invoice from [facility]. Please contact [facility] at [phone] with questions or concerns regarding your invoice.   Our billing staff will not be able to assist you with questions regarding bills from these companies.  You will be contacted with the lab results as soon as they are available. The fastest way to get your results is to activate your My Chart account. Instructions are located on the last page of this paperwork. If you have not heard from [place] regarding the results in 2 weeks, please contact this office.

## 2017-10-12 ENCOUNTER — Telehealth: Payer: Self-pay | Admitting: Family Medicine

## 2017-10-12 NOTE — Progress Notes (Unsigned)
  This encounter was created in error - please disregard. This encounter was created in error - please disregard. This encounter was created in error - please disregard. This encounter was created in error - please disregard. This encounter was created in error - please disregard. This encounter was created in error - please disregard.

## 2017-10-12 NOTE — Telephone Encounter (Signed)
Copied from [place] [phone]. Topic: Quick Communication - See Telephone Encounter >> Oct 12, 2017  9:39 AM Robina Ade, Helene Kelp D wrote: CRM for notification. See Telephone encounter for: 10/12/17. Patient called and said that she saw Zoe 10/10/17 and told her she had a cold/cough and would like to know if she can call her something in for it called to her pharmacy [id number] - [place], [place].

## 2017-10-12 NOTE — Telephone Encounter (Signed)
Please advise, thank you.

## 2017-10-13 MED ORDER — BENZONATATE 200 MG PO CAPS: 200.0000 mg | ORAL_CAPSULE | Freq: Two times a day (BID) | ORAL | 0 refills | Status: DC | PRN

## 2017-10-13 NOTE — Telephone Encounter (Signed)
Benzonatate sent in She can also take guaifenesin over the counter

## 2017-10-13 NOTE — Telephone Encounter (Signed)
LMOVM re: Benzonatate sent to pharmacy Also can take a guaifenesin product - Mucinex or robitussin OTC

## 2017-10-25 ENCOUNTER — Other Ambulatory Visit: Payer: Self-pay | Admitting: Oncology

## 2017-10-25 NOTE — Progress Notes (Signed)
No show

## 2017-11-02 ENCOUNTER — Telehealth: Payer: Self-pay | Admitting: Family Medicine

## 2017-11-02 NOTE — Telephone Encounter (Signed)
Called pt and left VM to let her  know that we will need to reschedule this appt for a different day. When she calls back, please reschedule her at her convenience with Dr. Nolon Rod for an OV for a med review.  Thanks!

## 2018-01-09 ENCOUNTER — Ambulatory Visit: Payer: BLUE CROSS/BLUE SHIELD | Admitting: Family Medicine

## 2018-01-28 ENCOUNTER — Ambulatory Visit: Payer: BLUE CROSS/BLUE SHIELD | Admitting: Family Medicine

## 2018-02-13 DIAGNOSIS — H43811 Vitreous degeneration, right eye: Secondary | ICD-10-CM | POA: Diagnosis not present

## 2018-02-13 DIAGNOSIS — H35033 Hypertensive retinopathy, bilateral: Secondary | ICD-10-CM | POA: Diagnosis not present

## 2018-03-02 ENCOUNTER — Emergency Department (HOSPITAL_COMMUNITY)
Admission: EM | Admit: 2018-03-02 | Discharge: 2018-03-03 | Disposition: A | Discharge status: Home / Self Care | Payer: BLUE CROSS/BLUE SHIELD | Attending: Emergency Medicine | Admitting: Emergency Medicine

## 2018-03-02 ENCOUNTER — Other Ambulatory Visit: Payer: Self-pay

## 2018-03-02 DIAGNOSIS — R51 Headache: Secondary | ICD-10-CM | POA: Insufficient documentation

## 2018-03-02 DIAGNOSIS — R519 Headache, unspecified: Secondary | ICD-10-CM

## 2018-03-02 DIAGNOSIS — Z7984 Long term (current) use of oral hypoglycemic drugs: Secondary | ICD-10-CM | POA: Diagnosis not present

## 2018-03-02 DIAGNOSIS — I10 Essential (primary) hypertension: Secondary | ICD-10-CM | POA: Insufficient documentation

## 2018-03-02 DIAGNOSIS — Z79899 Other long term (current) drug therapy: Secondary | ICD-10-CM | POA: Diagnosis not present

## 2018-03-02 DIAGNOSIS — R29818 Other symptoms and signs involving the nervous system: Secondary | ICD-10-CM | POA: Diagnosis not present

## 2018-03-03 ENCOUNTER — Encounter (HOSPITAL_COMMUNITY): Payer: Self-pay | Admitting: Emergency Medicine

## 2018-03-03 ENCOUNTER — Emergency Department (HOSPITAL_COMMUNITY): Payer: BLUE CROSS/BLUE SHIELD

## 2018-03-03 DIAGNOSIS — R51 Headache: Secondary | ICD-10-CM | POA: Diagnosis not present

## 2018-03-03 DIAGNOSIS — R29818 Other symptoms and signs involving the nervous system: Secondary | ICD-10-CM | POA: Diagnosis not present

## 2018-03-03 LAB — DIFFERENTIAL
ABS IMMATURE GRANULOCYTES: 0 10*3/uL (ref 0.0–0.1)
BASOS ABS: 0.1 10*3/uL (ref 0.0–0.1)
Basophils Relative: 1 %
EOS ABS: 0.1 10*3/uL (ref 0.0–0.7)
Eosinophils Relative: 1 %
IMMATURE GRANULOCYTES: 0 %
Lymphocytes Relative: 30 %
Lymphs Abs: 3 10*3/uL (ref 0.7–4.0)
Monocytes Absolute: 0.8 10*3/uL (ref 0.1–1.0)
Monocytes Relative: 8 %
NEUTROS PCT: 60 %
Neutro Abs: 5.9 10*3/uL (ref 1.7–7.7)

## 2018-03-03 LAB — COMPREHENSIVE METABOLIC PANEL
ALBUMIN: 4.2 g/dL (ref 3.5–5.0)
ALK PHOS: 66 U/L (ref 38–126)
ALT: 20 U/L (ref 0–44)
AST: 18 U/L (ref 15–41)
Anion gap: 10 (ref 5–15)
BILIRUBIN TOTAL: 0.5 mg/dL (ref 0.3–1.2)
BUN: 18 mg/dL (ref 8–23)
CO2: 24 mmol/L (ref 22–32)
Calcium: 9.8 mg/dL (ref 8.9–10.3)
Chloride: 107 mmol/L (ref 98–111)
Creatinine, Ser: 1.19 mg/dL — ABNORMAL HIGH (ref 0.44–1.00)
GFR calc Af Amer: 53 mL/min — ABNORMAL LOW (ref >60)
GFR calc non Af Amer: 45 mL/min — ABNORMAL LOW (ref >60)
GLUCOSE: 148 mg/dL — AB (ref 70–99)
POTASSIUM: 4.5 mmol/L (ref 3.5–5.1)
SODIUM: 141 mmol/L (ref 135–145)
TOTAL PROTEIN: 7.1 g/dL (ref 6.5–8.1)

## 2018-03-03 LAB — CBC
HCT: 45.4 % (ref 36.0–46.0)
Hemoglobin: 14.7 g/dL (ref 12.0–15.0)
MCH: 30.4 pg (ref 26.0–34.0)
MCHC: 32.4 g/dL (ref 30.0–36.0)
MCV: 94 fL (ref 78.0–100.0)
PLATELETS: 267 10*3/uL (ref 150–400)
RBC: 4.83 MIL/uL (ref 3.87–5.11)
RDW: 11.5 % (ref 11.5–15.5)
WBC: 10 10*3/uL (ref 4.0–10.5)

## 2018-03-03 LAB — I-STAT CHEM 8, ED
BUN: 22 mg/dL (ref 8–23)
CREATININE: 1.1 mg/dL — AB (ref 0.44–1.00)
Calcium, Ion: 1.2 mmol/L (ref 1.15–1.40)
Chloride: 105 mmol/L (ref 98–111)
Glucose, Bld: 143 mg/dL — ABNORMAL HIGH (ref 70–99)
HEMATOCRIT: 46 % (ref 36.0–46.0)
Hemoglobin: 15.6 g/dL — ABNORMAL HIGH (ref 12.0–15.0)
Potassium: 4.4 mmol/L (ref 3.5–5.1)
Sodium: 141 mmol/L (ref 135–145)
TCO2: 24 mmol/L (ref 22–32)

## 2018-03-03 LAB — PROTIME-INR
INR: 1.03
PROTHROMBIN TIME: 13.4 s (ref 11.4–15.2)

## 2018-03-03 LAB — APTT: APTT: 28 s (ref 24–36)

## 2018-03-03 LAB — I-STAT TROPONIN, ED: Troponin i, poc: 0 ng/mL (ref 0.00–0.08)

## 2018-03-03 MED ORDER — ASPIRIN EC 81 MG PO TBEC: 81.0000 mg | DELAYED_RELEASE_TABLET | Freq: Every day | ORAL | 3 refills | Status: DC

## 2018-03-03 NOTE — ED Provider Notes (Addendum)
[facility]ERGENC[facility] DEPARTMENT Provider Note   CSN: [id number] Arrival date & time: 03/02/18  2321     History   Chief Complaint Chief Complaint  Patient presents with  . Headache    HPI Shelly Simpson is a 72 y.o. female.  HPI 72 year old female with past medical history as below here with transient headache.  The patient states that she was in her usual state of health while she was watching TV yesterday.  She states that she began to feel a "tap" in her right parietal skull area.  She had some mild head pressure at that time.  She states that resolved shortly thereafter.  Over the day today, she has noted some mild pressure in this area as well.  She denies any overt pain, only pressure.  She denies any vision changes.  She called her PCP told her to come to the ED.  She denies any current complaints.  Denies any focal visual deficits, instability, lack of coordination, or focal numbness or weakness.  No history of strokes.  No history of seizures.  Of note, initial triage note mentioned facial droop and leaning.  The patient strongly denies any facial droop or leaning.  She states that she was looking for any of the signs, as she is well versed on the signs of stroke, but she did not notice any.  She had no focal numbness or weakness.   Past Medical History:  Diagnosis Date  . Allergy   . Cancer ([place])   . Family history of breast cancer in female   . Family history of ovarian cancer   . History of breast cancer   . Hypertension   . Seizures [facility])     Patient Active Problem List   Diagnosis Date Noted  . Unilateral primary osteoarthritis, left knee 06/12/2017  . Malignant neoplasm of overlapping sites of right breast in female, estrogen receptor positive ([place]) 06/25/2014  . History of breast cancer   . Family history of ovarian cancer   . Family history of breast cancer in female     Past Surgical History:  Procedure Laterality Date  . ABDOMINAL  HYSTERECTOMY    . BREAST SURGERY    . JOINT REPLACEMENT    . SPINE SURGERY     laminectomy     OB History   None      Home Medications    Prior to Admission medications   Medication Sig Start Date End Date Taking? Authorizing Provider  albuterol (PROAIR HFA) 108 (90 Base) MCG/ACT inhaler Inhale 2 puffs into the lungs every 6 (six) hours as needed. 10/10/17   Forrest Moron, MD  aspirin EC 81 MG tablet Take 1 tablet (81 mg total) by mouth daily. 03/03/18   Duffy Bruce, MD  atorvastatin (LIPITOR) 10 MG tablet Take 1 tablet (10 mg total) by mouth daily at 6 PM. 10/10/17   Forrest Moron, MD  benzonatate (TESSALON) 200 MG capsule Take 1 capsule (200 mg total) by mouth 2 (two) times daily as needed for cough. 10/13/17   Forrest Moron, MD  calcium citrate-vitamin D (CITRACAL+D) 315-200 MG-UNIT per tablet Take 1 tablet by mouth 2 (two) times daily.    [provider]  cetirizine (ZYRTEC) 10 MG tablet Take 10 mg by mouth daily as needed.     [provider]  fluticasone (FLONASE) 50 MCG/ACT nasal spray Place 2 sprays into the nose daily as needed.     [provider]  metFORMIN (GLUCOPHAGE-XR) 500 MG 24 hr tablet Take 1 tablet (500 mg total) by mouth daily with breakfast. 10/10/17   Forrest Moron, MD  Multiple Vitamin (MULTIVITAMIN) capsule Take 1 capsule by mouth daily.    [provider]  olmesartan-hydrochlorothiazide (BENICAR HCT) 20-12.5 MG tablet Take 1 tablet by mouth daily. 10/10/17   Forrest Moron, MD  phenytoin (DILANTIN) 100 MG ER capsule Take 3 capsules (300 mg total) by mouth at bedtime. 10/10/17   Forrest Moron, MD    Family History Family History  Problem Relation Age of Onset  . Ovarian cancer Mother 19       also uterine sarcoma  . Breast cancer Daughter 77  . Other Father   . Breast cancer Maternal Uncle        female breast cancer; deceased 53  . Breast cancer Cousin 74       Currently 42  . Cancer Maternal Grandmother    . Heart disease Maternal Grandfather   . Hypertension Maternal Grandfather   . Hypertension Paternal Grandfather     Social History Social History   Tobacco Use  . Smoking status: Never Smoker  . Smokeless tobacco: Never Used  Substance Use Topics  . Alcohol use: No  . Drug use: No     Allergies   Iohexol and Sulfa antibiotics   Review of Systems Review of Systems  Constitutional: Negative for chills, fatigue and fever.  HENT: Negative for congestion and rhinorrhea.   Eyes: Negative for visual disturbance.  Respiratory: Negative for cough, shortness of breath and wheezing.   Cardiovascular: Negative for chest pain and leg swelling.  Gastrointestinal: Negative for abdominal pain, diarrhea, nausea and vomiting.  Genitourinary: Negative for dysuria and flank pain.  Musculoskeletal: Negative for neck pain and neck stiffness.  Skin: Negative for rash and wound.  Allergic/Immunologic: Negative for immunocompromised state.  Neurological: Positive for headaches (Mild head pressure). Negative for syncope and weakness.  All other systems reviewed and are negative.    Physical Exam Updated Vital Signs BP 119/67   Pulse 100   Temp 98.7 F (37.1 C)   Resp 16   Wt 102.1 kg (225 lb)   SpO2 96%   BMI 35.77 kg/m   Physical Exam  Constitutional: She is oriented to person, place, and time. She appears well-developed and well-nourished. No distress.  HENT:  Head: Normocephalic and atraumatic.  Eyes: Conjunctivae are normal.  Neck: Neck supple.  Cardiovascular: Normal rate, regular rhythm and normal heart sounds. Exam reveals no friction rub.  No murmur heard. Pulmonary/Chest: Effort normal and breath sounds normal. No respiratory distress. She has no wheezes. She has no rales.  Abdominal: She exhibits no distension.  Musculoskeletal: She exhibits no edema.  Neurological: She is alert and oriented to person, place, and time. She exhibits normal muscle tone.  Skin: Skin is  warm. Capillary refill takes less than 2 seconds.  Psychiatric: She has a normal mood and affect.  Nursing note and vitals reviewed.   Neurological Exam:  Mental Status: Alert and oriented to person, place, and time. Attention and concentration normal. Speech clear. Recent memory is intact. Cranial Nerves: Visual fields grossly intact. EOMI and PERRLA. No nystagmus noted. Facial sensation intact at forehead, maxillary cheek, and chin/mandible bilaterally. No facial asymmetry or weakness. Hearing grossly normal. Uvula is midline, and palate elevates symmetrically. Normal SCM and trapezius strength. Tongue midline without fasciculations. Motor: Muscle strength 5/5 in proximal and distal UE and LE bilaterally. No  pronator drift. Muscle tone normal. Reflexes: 2+ and symmetrical in all four extremities.  Sensation: Intact to light touch in upper and lower extremities distally bilaterally.  Gait: Normal without ataxia. Coordination: Normal FTN bilaterally.     ED Treatments / Results  Labs (all labs ordered are listed, but only abnormal results are displayed) Labs Reviewed  COMPREHENSIVE METABOLIC PANEL - Abnormal; Notable for the following components:      Result Value   Glucose, Bld 148 (*)    Creatinine, Ser 1.19 (*)    GFR calc non Af Amer 45 (*)    GFR calc Af Amer 53 (*)    All other components within normal limits  I-STAT CHEM 8, ED - Abnormal; Notable for the following components:   Creatinine, Ser 1.10 (*)    Glucose, Bld 143 (*)    Hemoglobin 15.6 (*)    All other components within normal limits  PROTIME-INR  APTT  CBC  DIFFERENTIAL  I-STAT TROPONIN, ED  CBG MONITORING, ED    EKG None  Radiology Ct Head Wo Contrast  Result Date: 03/03/2018 CLINICAL DATA:  Sharp pain to the right side of head EXAM: CT HEAD WITHOUT CONTRAST TECHNIQUE: Contiguous axial images were obtained from the base of the skull through the vertex without intravenous contrast. COMPARISON:  None.  FINDINGS: Brain: Small hypodensity in the right basal ganglia, suspect for probable old lacunar infarct or dilated perivascular space. No acute territorial infarction hemorrhage or intracranial mass is visualized. Mild atrophy. Nonenlarged ventricles. Vascular: No hyperdense vessels.  Carotid vascular calcification Skull: Normal. Negative for fracture or focal lesion. Sinuses/Orbits: Mucosal opacification of the sphenoid and ethmoid sinuses. No acute orbital abnormality Other: None IMPRESSION: 1. Probable chronic lacunar infarct versus dilated perivascular space in the right basal ganglia. No definite CT evidence for acute intracranial abnormality. 2. Atrophy Electronically Signed   By: Donavan Foil M.D.   On: 03/03/2018 01:36   Mr Jodene Nam Head Wo Contrast  Result Date: 03/03/2018 CLINICAL DATA:  Initial evaluation for acute focal neuro deficit. EXAM: MRI HEAD WITHOUT CONTRAST MRA HEAD WITHOUT CONTRAST TECHNIQUE: Multiplanar, multiecho pulse sequences of the brain and surrounding structures were obtained without intravenous contrast. Angiographic images of the head were obtained using MRA technique without contrast. COMPARISON:  Prior CT from earlier the same day. FINDINGS: MRI HEAD FINDINGS Brain: Moderate cerebral atrophy. No significant cerebral white matter changes for age. Small remote lacunar infarcts noted within the bilateral thalami. No abnormal foci of restricted diffusion to suggest acute or subacute ischemia. Gray-white matter differentiation maintained. No other areas of remote cortical infarction. No evidence for acute or chronic intracranial hemorrhage. No mass lesion, midline shift or mass effect. No hydrocephalus. No extra-axial fluid collection. Normal pituitary gland. Vascular: Major intracranial vascular flow voids are maintained. Skull and upper cervical spine: Craniocervical junction within normal limits. Upper cervical spine normal. Bone marrow signal intensity within normal limits.  Hyperostosis frontalis interna noted. No scalp soft tissue abnormality. Sinuses/Orbits: Globes and orbital soft tissues demonstrate no acute finding. Prominent mucous retention cyst noted within the sphenoid sinuses bilaterally. Inner ear structures normal. Small right mastoid effusion, of doubtful significance. Other: None. MRA HEAD FINDINGS ANTERIOR CIRCULATION: Distal cervical segments of the internal carotid arteries are patent with antegrade flow. Petrous segments widely patent bilaterally. Short-segment moderate stenosis at the anterior genu of the cavernous right ICA. Cavernous and supraclinoid segments otherwise widely patent. A1 segments widely patent. Normal anterior communicating artery. Anterior cerebral arteries widely patent to their distal aspects.  M1 segments widely patent without stenosis. Distal MCA branches well perfused and symmetric. POSTERIOR CIRCULATION: Vertebral arteries patent to the vertebrobasilar junction without stenosis. Left vertebral artery slightly dominant. Posterior inferior cerebral arteries patent bilaterally. Basilar patent to its distal aspect without stenosis. Superior cerebral arteries patent bilaterally. PCAs primarily supplied via the basilar artery, although small bilateral posterior communicating arteries noted. PCAs patent to their distal aspects without stenosis. No aneurysm. IMPRESSION: MRI HEAD IMPRESSION: 1. No acute intracranial abnormality. 2. Moderate generalized cerebral atrophy. MRA HEAD IMPRESSION: 1. Negative intracranial MRA for large vessel occlusion. 2. Short-segment moderate cavernous right ICA stenosis. 3. No other hemodynamically significant or correctable stenosis within the intracranial circulation. Electronically Signed   By: Jeannine Boga M.D.   On: 03/03/2018 04:35   Mr Brain Wo Contrast  Result Date: 03/03/2018 CLINICAL DATA:  Initial evaluation for acute focal neuro deficit. EXAM: MRI HEAD WITHOUT CONTRAST MRA HEAD WITHOUT CONTRAST  TECHNIQUE: Multiplanar, multiecho pulse sequences of the brain and surrounding structures were obtained without intravenous contrast. Angiographic images of the head were obtained using MRA technique without contrast. COMPARISON:  Prior CT from earlier the same day. FINDINGS: MRI HEAD FINDINGS Brain: Moderate cerebral atrophy. No significant cerebral white matter changes for age. Small remote lacunar infarcts noted within the bilateral thalami. No abnormal foci of restricted diffusion to suggest acute or subacute ischemia. Gray-white matter differentiation maintained. No other areas of remote cortical infarction. No evidence for acute or chronic intracranial hemorrhage. No mass lesion, midline shift or mass effect. No hydrocephalus. No extra-axial fluid collection. Normal pituitary gland. Vascular: Major intracranial vascular flow voids are maintained. Skull and upper cervical spine: Craniocervical junction within normal limits. Upper cervical spine normal. Bone marrow signal intensity within normal limits. Hyperostosis frontalis interna noted. No scalp soft tissue abnormality. Sinuses/Orbits: Globes and orbital soft tissues demonstrate no acute finding. Prominent mucous retention cyst noted within the sphenoid sinuses bilaterally. Inner ear structures normal. Small right mastoid effusion, of doubtful significance. Other: None. MRA HEAD FINDINGS ANTERIOR CIRCULATION: Distal cervical segments of the internal carotid arteries are patent with antegrade flow. Petrous segments widely patent bilaterally. Short-segment moderate stenosis at the anterior genu of the cavernous right ICA. Cavernous and supraclinoid segments otherwise widely patent. A1 segments widely patent. Normal anterior communicating artery. Anterior cerebral arteries widely patent to their distal aspects. M1 segments widely patent without stenosis. Distal MCA branches well perfused and symmetric. POSTERIOR CIRCULATION: Vertebral arteries patent to the  vertebrobasilar junction without stenosis. Left vertebral artery slightly dominant. Posterior inferior cerebral arteries patent bilaterally. Basilar patent to its distal aspect without stenosis. Superior cerebral arteries patent bilaterally. PCAs primarily supplied via the basilar artery, although small bilateral posterior communicating arteries noted. PCAs patent to their distal aspects without stenosis. No aneurysm. IMPRESSION: MRI HEAD IMPRESSION: 1. No acute intracranial abnormality. 2. Moderate generalized cerebral atrophy. MRA HEAD IMPRESSION: 1. Negative intracranial MRA for large vessel occlusion. 2. Short-segment moderate cavernous right ICA stenosis. 3. No other hemodynamically significant or correctable stenosis within the intracranial circulation. Electronically Signed   By: Jeannine Boga M.D.   On: 03/03/2018 04:35    Procedures Procedures (including critical care time)  Medications Ordered in ED Medications - No data to display   Initial Impression / Assessment and Plan / ED Course  I have reviewed the triage vital signs and the nursing notes.  Pertinent labs & imaging results that were available during my care of the patient were reviewed by me and considered in my medical decision making (  see chart for details).     72 year old female with past medical history as above here with transient tapping sensation in her right parietal area.  There was initial confusion regarding neuro deficits but patient is adamant on my interview that she had no accompanying neurological deficits.  She is completely neurologically intact on my exam.  She has subtle asymmetry of her face, but fully intact facial strength and I suspect this is chronic.  Family states this is how she "always looks."  On exam, no other apparent emergent pathologies.  She has no tenderness over her temporal arteries or signs of GCA.  Her vision is intact.  She does have a negative/normal optometrist evaluation within  the last week.  Given her age and risk factors, will obtain an MRI, though suspect primary issue is primary headache syndrome, versus less likely a new atypical presentation of her seizures.  There is no generalized activity.  She is been compliant with her meds.  MRI is negative for any significant abnormality.  History is not consistent with TIA given duration of symptoms greater than 24 hours with negative MRI.  Given that she has moderate ICA stenosis, think it is reasonable to start her on a low-dose aspirin.  Will have her follow-up with her PCP and a neurologist.  We discussed strict return precautions including any recurrence of any kind of neurological deficits.  ABCD score, if we were considering the symptoms as a TIA, is 1-2, and with negative MRI and no significant intracranial stenosis, think is reasonable to continue outpatient work-up.  Final Clinical Impressions(s) / ED Diagnoses   Final diagnoses:  Nonintractable headache, unspecified chronicity pattern, unspecified headache type    ED Discharge Orders        Ordered    aspirin EC 81 MG tablet  Daily     03/03/18 0516       Duffy Bruce, MD 03/03/18 Sabino Donovan    Duffy Bruce, MD 03/03/18 [phone]

## 2018-03-03 NOTE — ED Triage Notes (Signed)
Pt states last evening @ 2200 while watching IV at home with husband she felt "tapping" then sharp feeling to R side of head. Pt states since that time she feels as if she wants to lean left, slight droop noted to L side of face with slight asymmetry to eyes. Pt states this is not normal for her.  Pt also reports she felt as if she had some gait issues since episode last night, pt called PCP line and was told to come to ED for eval.  A & Milana Na -

## 2018-03-03 NOTE — Discharge Instructions (Signed)
As we discussed, your imaging today was negative, which is very reassuring in the absence of any kind of neurological deficits.  However, if you return home and have recurrence of symptoms with any kind of additional neurological deficits, as we discussed, return to the ER immediately.  Based on your MRI findings, I do think it is reasonable to start on a low-dose 81 mg aspirin.  I recommend taking the enteric-coated or protected kind to prevent stomach upset.  Take this daily.  With food.

## 2018-03-03 NOTE — ED Notes (Signed)
Patient transported to MRI 

## 2018-03-04 ENCOUNTER — Ambulatory Visit: Payer: BLUE CROSS/BLUE SHIELD | Admitting: Family Medicine

## 2018-03-04 NOTE — Progress Notes (Deleted)
No chief complaint on file.   HPI  4 review of systems  Past Medical History:  Diagnosis Date  . Allergy   . Cancer ([place])   . Family history of breast cancer in female   . Family history of ovarian cancer   . History of breast cancer   . Hypertension   . Seizures ([place])     Current Outpatient Medications  Medication Sig Dispense Refill  . albuterol (PROAIR HFA) 108 (90 Base) MCG/ACT inhaler Inhale 2 puffs into the lungs every 6 (six) hours as needed. 1 Inhaler 2  . aspirin EC 81 MG tablet Take 1 tablet (81 mg total) by mouth daily. 30 tablet 3  . atorvastatin (LIPITOR) 10 MG tablet Take 1 tablet (10 mg total) by mouth daily at 6 PM. 90 tablet 1  . benzonatate (TESSALON) 200 MG capsule Take 1 capsule (200 mg total) by mouth 2 (two) times daily as needed for cough. 30 capsule 0  . calcium citrate-vitamin D (CITRACAL+D) 315-200 MG-UNIT per tablet Take 1 tablet by mouth 2 (two) times daily.    . cetirizine (ZYRTEC) 10 MG tablet Take 10 mg by mouth daily as needed.     . fluticasone (FLONASE) 50 MCG/ACT nasal spray Place 2 sprays into the nose daily as needed.     . metFORMIN (GLUCOPHAGE-XR) 500 MG 24 hr tablet Take 1 tablet (500 mg total) by mouth daily with breakfast. 90 tablet 1  . Multiple Vitamin (MULTIVITAMIN) capsule Take 1 capsule by mouth daily.    Marland Kitchen olmesartan-hydrochlorothiazide (BENICAR HCT) 20-12.5 MG tablet Take 1 tablet by mouth daily. 90 tablet 1  . phenytoin (DILANTIN) 100 MG ER capsule Take 3 capsules (300 mg total) by mouth at bedtime. 270 capsule 3   No current facility-administered medications for this visit.     Allergies:  Allergies  Allergen Reactions  . Iohexol      Desc: pt states in '80's wheezing; dyspnea w/ contrast--pt needs pre meds in future; slg 04/24/2008   . Sulfa Antibiotics     Past Surgical History:  Procedure Laterality Date  . ABDOMINAL HYSTERECTOMY    . BREAST SURGERY    . JOINT REPLACEMENT    . SPINE SURGERY     laminectomy     Social History   Socioeconomic History  . Marital status: Married    Spouse name: Not on file  . Number of children: Not on file  . Years of education: Not on file  . Highest education level: Not on file  Occupational History  . Not on file  Social Needs  . Financial resource strain: Not on file  . Food insecurity:    Worry: Not on file    Inability: Not on file  . Transportation needs:    Medical: Not on file    Non-medical: Not on file  Tobacco Use  . Smoking status: Never Smoker  . Smokeless tobacco: Never Used  Substance and Sexual Activity  . Alcohol use: No  . Drug use: No  . Sexual activity: Not on file  Lifestyle  . Physical activity:    Days per week: Not on file    Minutes per session: Not on file  . Stress: Not on file  Relationships  . Social connections:    Talks on phone: Not on file    Gets together: Not on file    Attends religious service: Not on file    Active member of club or organization: Not on file  Attends meetings of clubs or organizations: Not on file    Relationship status: Not on file  Other Topics Concern  . Not on file  Social History Narrative  . Not on file    Family History  Problem Relation Age of Onset  . Ovarian cancer Mother 73       also uterine sarcoma  . Breast cancer Daughter 58  . Other Father   . Breast cancer Maternal Uncle        female breast cancer; deceased 63  . Breast cancer Cousin 70       Currently 21  . Cancer Maternal Grandmother   . Heart disease Maternal Grandfather   . Hypertension Maternal Grandfather   . Hypertension Paternal Grandfather      ROS Review of Systems See HPI Constitution: No fevers or chills No malaise No diaphoresis Skin: No rash or itching Eyes: no blurry vision, no double vision GU: no dysuria or hematuria Neuro: no dizziness or headaches * all others reviewed and negative   Objective: There were no vitals filed for this visit.  Physical Exam  Assessment and  Plan There are no diagnoses linked to this encounter.   Trisa Cranor P [employer]

## 2018-03-06 ENCOUNTER — Ambulatory Visit (INDEPENDENT_AMBULATORY_CARE_PROVIDER_SITE_OTHER): Payer: BLUE CROSS/BLUE SHIELD | Admitting: Family Medicine

## 2018-03-06 ENCOUNTER — Other Ambulatory Visit: Payer: Self-pay

## 2018-03-06 VITALS — BP 132/79 | HR 92 | Temp 98.8°F | Ht 65.25 in | Wt 227.0 lb

## 2018-03-06 DIAGNOSIS — I10 Essential (primary) hypertension: Secondary | ICD-10-CM

## 2018-03-06 DIAGNOSIS — R51 Headache: Secondary | ICD-10-CM | POA: Diagnosis not present

## 2018-03-06 DIAGNOSIS — Z09 Encounter for follow-up examination after completed treatment for conditions other than malignant neoplasm: Secondary | ICD-10-CM | POA: Diagnosis not present

## 2018-03-06 DIAGNOSIS — R519 Headache, unspecified: Secondary | ICD-10-CM

## 2018-03-06 NOTE — Progress Notes (Signed)
Chief Complaint  Patient presents with  . Medication Management    med given in ED - pt has questions  . Hospital tests    Pt has questions - Adm 7/6- DC 7/7  . Rash    L/calf  -Has had since before last visit 10/10/2017.    HPI   Pt reports that she was at home Saturday and felt a tap in the inside of her skull in the parietal lobe She states that she has never felt that before  There was a little pain like a recoil from a snapped rubber band She reports that her vision, speech, motor were all fine She reports that it happened Friday 03/01/18 and last a minute The second day she felt pressure in her head 03/02/18 She states that on the second day she felt pressure and called the answering service but noted that there is no pain and she had normal vision.  She states that she had a normal neurologic exam at the hospital.   She was seen in the ER for rule out stoke She states that she has not further symptoms BP Readings from Last 3 Encounters:  03/06/18 132/79  03/03/18 119/67  10/10/17 119/74     Past Medical History:  Diagnosis Date  . Allergy   . Cancer ([place])   . Family history of breast cancer in female   . Family history of ovarian cancer   . History of breast cancer   . Hypertension   . Seizures ([place])     Current Outpatient Medications  Medication Sig Dispense Refill  . albuterol (PROAIR HFA) 108 (90 Base) MCG/ACT inhaler Inhale 2 puffs into the lungs every 6 (six) hours as needed. 1 Inhaler 2  . aspirin EC 81 MG tablet Take 1 tablet (81 mg total) by mouth daily. 30 tablet 3  . atorvastatin (LIPITOR) 10 MG tablet Take 1 tablet (10 mg total) by mouth daily at 6 PM. 90 tablet 1  . calcium citrate-vitamin D (CITRACAL+D) 315-200 MG-UNIT per tablet Take 1 tablet by mouth 2 (two) times daily.    . cetirizine (ZYRTEC) 10 MG tablet Take 10 mg by mouth daily as needed.     . fluticasone (FLONASE) 50 MCG/ACT nasal spray Place 2 sprays into the nose daily as needed.     .  metFORMIN (GLUCOPHAGE-XR) 500 MG 24 hr tablet Take 1 tablet (500 mg total) by mouth daily with breakfast. 90 tablet 1  . Multiple Vitamin (MULTIVITAMIN) capsule Take 1 capsule by mouth daily.    Marland Kitchen olmesartan-hydrochlorothiazide (BENICAR HCT) 20-12.5 MG tablet Take 1 tablet by mouth daily. 90 tablet 1  . phenytoin (DILANTIN) 100 MG ER capsule Take 3 capsules (300 mg total) by mouth at bedtime. 270 capsule 3   No current facility-administered medications for this visit.     Allergies:  Allergies  Allergen Reactions  . Iohexol      Desc: pt states in '80's wheezing; dyspnea w/ contrast--pt needs pre meds in future; slg 04/24/2008   . Sulfa Antibiotics     Past Surgical History:  Procedure Laterality Date  . ABDOMINAL HYSTERECTOMY    . BREAST SURGERY    . JOINT REPLACEMENT    . SPINE SURGERY     laminectomy    Social History   Socioeconomic History  . Marital status: Married    Spouse name: Not on file  . Number of children: Not on file  . Years of education: Not on file  . Highest  education level: Not on file  Occupational History  . Not on file  Social Needs  . Financial resource strain: Not on file  . Food insecurity:    Worry: Not on file    Inability: Not on file  . Transportation needs:    Medical: Not on file    Non-medical: Not on file  Tobacco Use  . Smoking status: Never Smoker  . Smokeless tobacco: Never Used  Substance and Sexual Activity  . Alcohol use: No  . Drug use: No  . Sexual activity: Not on file  Lifestyle  . Physical activity:    Days per week: Not on file    Minutes per session: Not on file  . Stress: Not on file  Relationships  . Social connections:    Talks on phone: Not on file    Gets together: Not on file    Attends religious service: Not on file    Active member of club or organization: Not on file    Attends meetings of clubs or organizations: Not on file    Relationship status: Not on file  Other Topics Concern  . Not on file    Social History Narrative  . Not on file    Family History  Problem Relation Age of Onset  . Ovarian cancer Mother 105       also uterine sarcoma  . Breast cancer Daughter 48  . Other Father   . Breast cancer Maternal Uncle        female breast cancer; deceased 59  . Breast cancer Cousin 1       Currently 39  . Cancer Maternal Grandmother   . Heart disease Maternal Grandfather   . Hypertension Maternal Grandfather   . Hypertension Paternal Grandfather      ROS Review of Systems See HPI Constitution: No fevers or chills No malaise No diaphoresis Skin: No rash or itching Eyes: no blurry vision, no double vision GU: no dysuria or hematuria Neuro: no dizziness or headaches all others reviewed and negative   Objective: Vitals:   03/06/18 1218  BP: 132/79  Pulse: 92  Temp: 98.8 F (37.1 C)  TempSrc: Oral  SpO2: 96%  Weight: 227 lb (103 kg)  Height: 5' 5.25" (1.657 m)    Physical Exam  Constitutional: She is oriented to person, place, and time. She appears well-developed and well-nourished.  HENT:  Head: Normocephalic and atraumatic.  Eyes: Conjunctivae and EOM are normal.  Neck: Normal range of motion. Neck supple.  Cardiovascular: Normal rate, regular rhythm and normal heart sounds.  No murmur heard. Pulmonary/Chest: Effort normal and breath sounds normal. No stridor. No respiratory distress. She has no wheezes.  Neurological: She is alert and oriented to person, place, and time.  Skin: Skin is warm. Capillary refill takes less than 2 seconds.  Psychiatric: She has a normal mood and affect. Her behavior is normal. Judgment and thought content normal.      IMPRESSION: MRI HEAD IMPRESSION:  1. No acute intracranial abnormality. 2. Moderate generalized cerebral atrophy.  MRA HEAD IMPRESSION:  1. Negative intracranial MRA for large vessel occlusion. 2. Short-segment moderate cavernous right ICA stenosis. 3. No other hemodynamically significant or  correctable stenosis within the intracranial circulation.   Electronically Signed   By: Jeannine Boga M.D.   On: 03/03/2018 04:35  Assessment and Plan Lileigh was seen today for medication management, hospital tests and rash.  Diagnoses and all orders for this visit:  Hospital discharge follow-up Reviewed  ER visit and findings Discussed her symptoms and the cause is uncertain Discussed that she should continue to monitor symptoms Continue current meds  Pressure in head Symptoms resolved Cause unknown  Essential hypertension bp stable, continue current meds Reviewed risk factors  A total of 25 minutes were spent face-to-face with the patient during this encounter and over half of that time was spent on counseling and coordination of care.   [place]

## 2018-03-06 NOTE — Patient Instructions (Addendum)
   IF you received an x-ray today, you will receive an invoice from [facility]. Please contact [facility] at [phone] with questions or concerns regarding your invoice.   IF you received labwork today, you will receive an invoice from [facility]. Please contact [facility] at [phone] with questions or concerns regarding your invoice.   Our billing staff will not be able to assist you with questions regarding bills from these companies.  You will be contacted with the lab results as soon as they are available. The fastest way to get your results is to activate your My Chart account. Instructions are located on the last page of this paperwork. If you have not heard from us regarding the results in 2 weeks, please contact this office.     Hypertension Hypertension, commonly called high blood pressure, is when the force of blood pumping through the arteries is too strong. The arteries are the blood vessels that carry blood from the heart throughout the body. Hypertension forces the heart to work harder to pump blood and may cause arteries to become narrow or stiff. Having untreated or uncontrolled hypertension can cause heart attacks, strokes, kidney disease, and other problems. A blood pressure reading consists of a higher number over a lower number. Ideally, your blood pressure should be below 120/80. The first ("top") number is called the systolic pressure. It is a measure of the pressure in your arteries as your heart beats. The second ("bottom") number is called the diastolic pressure. It is a measure of the pressure in your arteries as the heart relaxes. What are the causes? The cause of this condition is not known. What increases the risk? Some risk factors for high blood pressure are under your control. Others are not. Factors you can change  Smoking.  Having type 2 diabetes mellitus, high cholesterol, or both.  Not getting enough exercise or physical  activity.  Being overweight.  Having too much fat, sugar, calories, or salt (sodium) in your diet.  Drinking too much alcohol. Factors that are difficult or impossible to change  Having chronic kidney disease.  Having a family history of high blood pressure.  Age. Risk increases with age.  Race. You may be at higher risk if you are African-American.  Gender. Men are at higher risk than women before age 45. After age 65, women are at higher risk than men.  Having obstructive sleep apnea.  Stress. What are the signs or symptoms? Extremely high blood pressure (hypertensive crisis) may cause:  Headache.  Anxiety.  Shortness of breath.  Nosebleed.  Nausea and vomiting.  Severe chest pain.  Jerky movements you cannot control (seizures).  How is this diagnosed? This condition is diagnosed by measuring your blood pressure while you are seated, with your arm resting on a surface. The cuff of the blood pressure monitor will be placed directly against the skin of your upper arm at the level of your heart. It should be measured at least twice using the same arm. Certain conditions can cause a difference in blood pressure between your right and left arms. Certain factors can cause blood pressure readings to be lower or higher than normal (elevated) for a short period of time:  When your blood pressure is higher when you are in a health care provider's office than when you are at home, this is called white coat hypertension. Most people with this condition do not need medicines.  When your blood pressure is higher at home than when you   are in a health care provider's office, this is called masked hypertension. Most people with this condition may need medicines to control blood pressure.  If you have a high blood pressure reading during one visit or you have normal blood pressure with other risk factors:  You may be asked to return on a different day to have your blood pressure  checked again.  You may be asked to monitor your blood pressure at home for 1 week or longer.  If you are diagnosed with hypertension, you may have other blood or imaging tests to help your health care provider understand your overall risk for other conditions. How is this treated? This condition is treated by making healthy lifestyle changes, such as eating healthy foods, exercising more, and reducing your alcohol intake. Your health care provider may prescribe medicine if lifestyle changes are not enough to get your blood pressure under control, and if:  Your systolic blood pressure is above 130.  Your diastolic blood pressure is above 80.  Your personal target blood pressure may vary depending on your medical conditions, your age, and other factors. Follow these instructions at home: Eating and drinking  Eat a diet that is high in fiber and potassium, and low in sodium, added sugar, and fat. An example eating plan is called the DASH (Dietary Approaches to Stop Hypertension) diet. To eat this way: ? Eat plenty of fresh fruits and vegetables. Try to fill half of your plate at each meal with fruits and vegetables. ? Eat whole grains, such as whole wheat pasta, brown rice, or whole grain bread. Fill about one quarter of your plate with whole grains. ? Eat or drink low-fat dairy products, such as skim milk or low-fat yogurt. ? Avoid fatty cuts of meat, processed or cured meats, and poultry with skin. Fill about one quarter of your plate with lean proteins, such as fish, chicken without skin, beans, eggs, and tofu. ? Avoid premade and processed foods. These tend to be higher in sodium, added sugar, and fat.  Reduce your daily sodium intake. Most people with hypertension should eat less than 1,500 mg of sodium a day.  Limit alcohol intake to no more than 1 drink a day for nonpregnant women and 2 drinks a day for men. One drink equals 12 oz of beer, 5 oz of wine, or 1 oz of hard  liquor. Lifestyle  Work with your health care provider to maintain a healthy body weight or to lose weight. Ask what an ideal weight is for you.  Get at least 30 minutes of exercise that causes your heart to beat faster (aerobic exercise) most days of the week. Activities may include walking, swimming, or biking.  Include exercise to strengthen your muscles (resistance exercise), such as pilates or lifting weights, as part of your weekly exercise routine. Try to do these types of exercises for 30 minutes at least 3 days a week.  Do not use any products that contain nicotine or tobacco, such as cigarettes and e-cigarettes. If you need help quitting, ask your health care provider.  Monitor your blood pressure at home as told by your health care provider.  Keep all follow-up visits as told by your health care provider. This is important. Medicines  Take over-the-counter and prescription medicines only as told by your health care provider. Follow directions carefully. Blood pressure medicines must be taken as prescribed.  Do not skip doses of blood pressure medicine. Doing this puts you at risk for problems and   can make the medicine less effective.  Ask your health care provider about side effects or reactions to medicines that you should watch for. Contact a health care provider if:  You think you are having a reaction to a medicine you are taking.  You have headaches that keep coming back (recurring).  You feel dizzy.  You have swelling in your ankles.  You have trouble with your vision. Get help right away if:  You develop a severe headache or confusion.  You have unusual weakness or numbness.  You feel faint.  You have severe pain in your chest or abdomen.  You vomit repeatedly.  You have trouble breathing. Summary  Hypertension is when the force of blood pumping through your arteries is too strong. If this condition is not controlled, it may put you at risk for serious  complications.  Your personal target blood pressure may vary depending on your medical conditions, your age, and other factors. For most people, a normal blood pressure is less than 120/80.  Hypertension is treated with lifestyle changes, medicines, or a combination of both. Lifestyle changes include weight loss, eating a healthy, low-sodium diet, exercising more, and limiting alcohol. This information is not intended to replace advice given to you by your health care provider. Make sure you discuss any questions you have with your health care provider. Document Released: 08/14/2005 Document Revised: 07/12/2016 Document Reviewed: 07/12/2016 Elsevier Interactive Patient Education  2018 [employer]vier Inc.  

## 2018-04-06 ENCOUNTER — Other Ambulatory Visit: Payer: Self-pay | Admitting: Family Medicine

## 2018-04-08 NOTE — Telephone Encounter (Signed)
olmesartan-HCTZ refill Last Refill:10/10/17 # 90 tab 1 RF Last OV: 10/10/17 PCP: Delia Chimes MD Pharmacy:[facility]

## 2018-04-16 DIAGNOSIS — H35033 Hypertensive retinopathy, bilateral: Secondary | ICD-10-CM | POA: Diagnosis not present

## 2018-04-16 DIAGNOSIS — H43813 Vitreous degeneration, bilateral: Secondary | ICD-10-CM | POA: Diagnosis not present

## 2018-04-16 DIAGNOSIS — H40053 Ocular hypertension, bilateral: Secondary | ICD-10-CM | POA: Diagnosis not present

## 2018-07-02 ENCOUNTER — Other Ambulatory Visit: Payer: Self-pay | Admitting: Family Medicine

## 2018-07-02 ENCOUNTER — Other Ambulatory Visit: Payer: Self-pay | Admitting: *Deleted

## 2018-07-02 DIAGNOSIS — Z853 Personal history of malignant neoplasm of breast: Secondary | ICD-10-CM

## 2018-07-02 DIAGNOSIS — C50811 Malignant neoplasm of overlapping sites of right female breast: Secondary | ICD-10-CM

## 2018-07-02 DIAGNOSIS — N63 Unspecified lump in unspecified breast: Secondary | ICD-10-CM

## 2018-07-02 DIAGNOSIS — Z170 Estrogen receptor positive status [ER+]: Secondary | ICD-10-CM

## 2018-07-02 NOTE — Telephone Encounter (Signed)
Requested Prescriptions  Pending Prescriptions Disp Refills  . metFORMIN (GLUCOPHAGE-XR) 500 MG 24 hr tablet [Pharmacy Med Name: METFORMIN HCL ER 500 MG TABLET] 90 tablet 0    Sig: TAKE 1 TABLET BY MOUTH EVERY DAY WITH BREAKFAST     Endocrinology:  Diabetes - Biguanides Failed - 07/02/2018  1:47 AM      Failed - Cr in normal range and within 360 days    Creatinine  Date Value Ref Range Status  08/30/2016 0.8 0.6 - 1.1 mg/dL Final   Creatinine, Ser  Date Value Ref Range Status  03/03/2018 1.10 (H) 0.44 - 1.00 mg/dL Final         Failed - HBA1C is between 0 and 7.9 and within 180 days    No results found for: HGBA1C       Failed - eGFR in normal range and within 360 days    GFR calc Af Amer  Date Value Ref Range Status  03/03/2018 53 (L) >60 mL/min Final    Comment:    (NOTE) The eGFR has been calculated using the CKD EPI equation. This calculation has not been validated in all clinical situations. eGFR's persistently <60 mL/min signify possible Chronic Kidney Disease.    GFR calc non Af Amer  Date Value Ref Range Status  03/03/2018 45 (L) >60 mL/min Final         Passed - Valid encounter within last 6 months    Recent Outpatient Visits          3 months ago Pressure in head   [facility] [place], [place]   8 months ago Acute URI   [facility] [place], [place]   3 years ago Acute maxillary sinusitis, recurrence not specified   [facility], MD   5 years ago Cough   [facility], [facility]

## 2018-08-01 ENCOUNTER — Other Ambulatory Visit: Payer: Self-pay | Admitting: Oncology

## 2018-08-01 DIAGNOSIS — Z170 Estrogen receptor positive status [ER+]: Secondary | ICD-10-CM

## 2018-08-01 DIAGNOSIS — Z853 Personal history of malignant neoplasm of breast: Secondary | ICD-10-CM

## 2018-08-01 DIAGNOSIS — N63 Unspecified lump in unspecified breast: Secondary | ICD-10-CM

## 2018-08-01 DIAGNOSIS — C50811 Malignant neoplasm of overlapping sites of right female breast: Secondary | ICD-10-CM

## 2018-08-12 ENCOUNTER — Ambulatory Visit
Admission: RE | Admit: 2018-08-12 | Discharge: 2018-08-12 | Disposition: A | Discharge status: Home / Self Care | Payer: BLUE CROSS/BLUE SHIELD | Source: Ambulatory Visit | Attending: Oncology | Admitting: Oncology

## 2018-08-12 ENCOUNTER — Ambulatory Visit: Payer: BLUE CROSS/BLUE SHIELD

## 2018-08-12 DIAGNOSIS — Z853 Personal history of malignant neoplasm of breast: Secondary | ICD-10-CM | POA: Diagnosis not present

## 2018-08-12 DIAGNOSIS — C50811 Malignant neoplasm of overlapping sites of right female breast: Secondary | ICD-10-CM

## 2018-08-12 DIAGNOSIS — N63 Unspecified lump in unspecified breast: Secondary | ICD-10-CM

## 2018-08-12 DIAGNOSIS — R922 Inconclusive mammogram: Secondary | ICD-10-CM | POA: Diagnosis not present

## 2018-08-12 DIAGNOSIS — Z170 Estrogen receptor positive status [ER+]: Secondary | ICD-10-CM

## 2018-08-13 ENCOUNTER — Telehealth: Payer: Self-pay | Admitting: Oncology

## 2018-08-13 NOTE — Telephone Encounter (Signed)
Cancel per patient

## 2018-09-10 ENCOUNTER — Other Ambulatory Visit: Payer: BLUE CROSS/BLUE SHIELD

## 2018-09-10 ENCOUNTER — Ambulatory Visit: Payer: BLUE CROSS/BLUE SHIELD | Admitting: Oncology

## 2018-09-26 ENCOUNTER — Other Ambulatory Visit: Payer: Self-pay | Admitting: Family Medicine

## 2018-09-26 MED ORDER — METFORMIN HCL ER 500 MG PO TB24: ORAL_TABLET | ORAL | 0 refills | Status: DC

## 2018-09-26 MED ORDER — OLMESARTAN MEDOXOMIL-HCTZ 20-12.5 MG PO TABS: 1.0000 | ORAL_TABLET | Freq: Every day | ORAL | 0 refills | Status: DC

## 2018-09-26 NOTE — Telephone Encounter (Signed)
Attempted to call Patient .  No answer no voicemail.  Patient needs appointment set up.

## 2018-09-26 NOTE — Telephone Encounter (Signed)
Copied from [place] [id number]. Topic: Quick Communication - Rx Refill/Question >> Sep 26, 2018  2:47 PM Leward Quan A wrote: Medication: metFORMIN (GLUCOPHAGE-XR) 500 MG 24 hr tablet, olmesartan-hydrochlorothiazide (BENICAR HCT) 20-12.5 MG tablet  Has the patient contacted their pharmacy? Yes.   (Agent: If no, request that the patient contact the pharmacy for the refill.) (Agent: If yes, when and what did the pharmacy advise?) [id number] [address], [place] (Phone) [phone] (Fax)   Preferred Pharmacy (with phone number or street name):   Agent: Please be advised that RX refills may take up to 3 business days. We ask that you follow-up with your pharmacy.

## 2018-10-14 ENCOUNTER — Telehealth: Payer: Self-pay | Admitting: Oncology

## 2018-10-14 NOTE — Telephone Encounter (Signed)
Called patient per scheduling voicemail log.  Rescheduled patient cancelled appt.  Patient aware of appt date and time.

## 2018-10-24 ENCOUNTER — Ambulatory Visit: Payer: BLUE CROSS/BLUE SHIELD

## 2018-10-24 DIAGNOSIS — E785 Hyperlipidemia, unspecified: Secondary | ICD-10-CM | POA: Diagnosis not present

## 2018-10-25 LAB — LIPID PANEL
CHOL/HDL RATIO: 3.2 ratio (ref 0.0–4.4)
Cholesterol, Total: 209 mg/dL — ABNORMAL HIGH (ref 100–199)
HDL: 65 mg/dL (ref >39)
LDL Calculated: 121 mg/dL — ABNORMAL HIGH (ref 0–99)
Triglycerides: 116 mg/dL (ref 0–149)
VLDL CHOLESTEROL CAL: 23 mg/dL (ref 5–40)

## 2018-10-26 ENCOUNTER — Encounter: Payer: Self-pay | Admitting: Family Medicine

## 2018-10-26 ENCOUNTER — Telehealth: Payer: Self-pay | Admitting: Family Medicine

## 2018-10-26 NOTE — Telephone Encounter (Signed)
Patient brought in a form to be completed by Dr Nolon Rod, stated that the forms had to be turned in today 10/26/18. I do not see any notes in the computer about any forms.  Please call the patient back at [phone]

## 2018-10-27 NOTE — Progress Notes (Signed)
[place]  [phone] [phone]     ID: Shelly Simpson   DOB: 24-Feb-1946  MR#: [id number]  JAS#:[id number]  Patient Care Team: Forrest Moron, MD as PCP - General (Internal Medicine) Magrinat, Virgie Dad, MD as Consulting Physician (Oncology) Neldon Mc, MD as Consulting Physician (General Surgery) Kyung Rudd, MD as Consulting Physician (Radiation Oncology) OTHER MD:    CHIEF COMPLAINT: Estrogen receptor positive breast cancer  CURRENT TREATMENT: Observation   BREAST CANCER HISTORY:  From Dr. Collier Salina Rubin's noted 04/29/2008:  She underwent a screening mammogram in January.  She has had previous six monthly screening mammogram for evaluation of benign appearing calcifications.  She subsequently had a six-month followup mammogram in July 16th, 2009.  This showed indeterminate calcifications in the retroareolar right breast.  Biopsy was recommended.  Invasive ductal cancer associated with DCIS was noted.  The invasive component was ER and PR positive at 99% and 51% respectively, HER-2 was 2+, and proliferative index was 10 %.  The patient had an MRI scan on March 19, 2008.  This showed a 1 x 1 x 0.8 cm area of DCIS with small amount of invasive cancer 6 o'clock position.  The other exam is otherwise unremarkable.  The patient elected to undergo a lumpectomy and sentinel lymph node evaluation on 04/08/2008.  Final pathology showed invasive ductal cancer grade 2 of 3 measuring 1.2 cm.  The invasive component was less than 0.1 cm to the deep margin.  Additional margin was removed, which showed invasive cancer with DCIS focally less than 0.1 cm with a new margin.  Two sentinel lymph nodes were identified, one sentinel lymph node had micro metastases noted.  Of note is that the final FISH testing was performed on the primary tumor, which was shown to be positive for amplification in the ratio of 2.45  Subsequent history is as detailed  below.   INTERVAL HISTORY: Shelly Simpson returns today for follow-up and treatment of her history of estrogen receptor positive breast cancer.   She continues under observation.   Since her last visit here, she underwent a head CT on 03/03/2018 for a sharp pain to the right side of her head showing probable chronic lacunar infarct versus dilated perivascular space in the right basal ganglia. No definite CT evidence for acute intracranial abnormality. Atrophy   She also underwent a Head MRA and Brain MRI without contrast on 03/03/2018 showing no acute intracranial abnormality. Moderate generalized cerebral atrophy. Negative intracranial MRA for large vessel occlusion. Short-segment moderate cavernous right ICA stenosis. No other hemodynamically significant or correctable stenosis within the intracranial circulation.  Finally, she underwent a bilateral digital diagnostic mammogram with tomography on 08/12/2018 showing Breast Density Category C. There is no mammographic evidence for malignancy.   REVIEW OF SYSTEMS: Shelly Simpson has been going back and fourth to [place] taking care of her 30 year old grandson. She doesn't always like to walk outside due to sinus issues, but she does have some tapes that she follows to walk around her house. She has not noticed a change in either breast. The patient denies unusual headaches, visual changes, nausea, vomiting, or dizziness. There has been no unusual cough, phlegm production, or pleurisy. This been no change in bowel or bladder habits. The patient denies unexplained fatigue or unexplained weight loss, bleeding, rash, or fever. A detailed review of systems was otherwise noncontributory.    PAST MEDICAL HISTORY: Past Medical History:  Diagnosis Date  . Allergy   . Cancer ([place])   .  Family history of breast cancer in female   . Family history of ovarian cancer   . History of breast cancer   . Hypertension   . Seizures ([place])    Seizures, hypertension,  hypercholesterolemia   PAST SURGICAL HISTORY: Past Surgical History:  Procedure Laterality Date  . ABDOMINAL HYSTERECTOMY    . BREAST LUMPECTOMY Right 2009  . BREAST SURGERY    . JOINT REPLACEMENT    . SPINE SURGERY     laminectomy   Status post hysterectomy with bilateral stalpingo- oophorectomy, status post laminectomy, status post tonsillectomy and adenoidectomy, status post knee surgery   FAMILY HISTORY: Family History  Problem Relation Age of Onset  . Ovarian cancer Mother 94       also uterine sarcoma  . Breast cancer Daughter 37  . Other Father   . Breast cancer Maternal Uncle        female breast cancer; deceased 53  . Breast cancer Cousin 92       Currently 15  . Cancer Maternal Grandmother   . Heart disease Maternal Grandfather   . Hypertension Maternal Grandfather   . Hypertension Paternal Grandfather    The patient's father died in his 61s from congestive heart failure and what sounds like a pulmonary embolus. The patient's mother died at the age of 51, from ovarian cancer, which was diagnosed shortly before her birth. Also the patient's mother is brother had a diagnosis of breast cancer, late in life.    GYNECOLOGIC HISTORY: Menarche age 38, first live birth age 11, she is GX P2, undergoing hysterectomy in 73. She took hormone replacement for approximately 16 years.   SOCIAL HISTORY: She used to work for [employer]. Her husband Shelly Simpson is also retired. Daughter Shelly Simpson works in [place] as a Audiological scientist; daughter Shelly Simpson teaches biology in college; she has a PhD degree. The patient has 1 grandchild, who is 68 years old as of 10/28/2018.   ADVANCED DIRECTIVES: Not in place   HEALTH MAINTENANCE: Social History   Tobacco Use  . Smoking status: Never Smoker  . Smokeless tobacco: Never Used  Substance Use Topics  . Alcohol use: No  . Drug use: No     Allergies  Allergen Reactions  . Iohexol      Desc: pt states in '80's wheezing;  dyspnea w/ contrast--pt needs pre meds in future; slg 04/24/2008   . Sulfa Antibiotics     Current Outpatient Medications  Medication Sig Dispense Refill  . albuterol (PROAIR HFA) 108 (90 Base) MCG/ACT inhaler Inhale 2 puffs into the lungs every 6 (six) hours as needed. 1 Inhaler 2  . aspirin EC 81 MG tablet Take 1 tablet (81 mg total) by mouth daily. 30 tablet 3  . atorvastatin (LIPITOR) 10 MG tablet TAKE 1 TABLET DAILY AT 6PM 90 tablet 1  . calcium citrate-vitamin D (CITRACAL+D) 315-200 MG-UNIT per tablet Take 1 tablet by mouth 2 (two) times daily.    . cetirizine (ZYRTEC) 10 MG tablet Take 10 mg by mouth daily as needed.     . fluticasone (FLONASE) 50 MCG/ACT nasal spray Place 2 sprays into the nose daily as needed.     . metFORMIN (GLUCOPHAGE-XR) 500 MG 24 hr tablet TAKE 1 TABLET BY MOUTH EVERY DAY WITH BREAKFAST 90 tablet 0  . Multiple Vitamin (MULTIVITAMIN) capsule Take 1 capsule by mouth daily.    Mar[place] Kitchen olmesartan-hydrochlorothiazide (BENICAR HCT) 20-12.5 MG tablet Take 1 tablet by mouth daily. 90 tablet 0  . phenytoin (  DILANTIN) 100 MG ER capsule Take 3 capsules (300 mg total) by mouth at bedtime. 270 capsule 3   No current facility-administered medications for this visit.     OBJECTIVE: Middle-aged woman who appears stated age  73:   10/28/18 1058  BP: 118/71  Pulse: 84  Resp: 18  Temp: 98.4 F (36.9 C)  SpO2: 96%     Body mass index is 36.96 kg/m.    ECOG FS: 1  Sclerae unicteric, EOMs intact Oropharynx clear and moist No cervical or supraclavicular adenopathy Lungs no rales or rhonchi Heart regular rate and rhythm Abd soft, nontender, positive bowel sounds MSK no focal spinal tenderness, no upper extremity lymphedema Neuro: nonfocal, well oriented, appropriate affect Breasts: Right breast is status post lumpectomy and radiation.  There is no evidence of disease recurrence.  Left breast is unremarkable.  Both axillae are benign.  Right breast imaged  09/06/2017    LAB RESULTS: Lab Results  Component Value Date   WBC 7.5 10/28/2018   NEUTROABS 4.7 10/28/2018   HGB 14.5 10/28/2018   HCT 45.2 10/28/2018   MCV 94.6 10/28/2018   PLT 278 10/28/2018      Chemistry      Component Value Date/Time   NA 141 10/28/2018 1031   NA 141 08/30/2016 1412   K 4.1 10/28/2018 1031   K 4.1 08/30/2016 1412   CL 104 10/28/2018 1031   CO2 26 10/28/2018 1031   CO2 24 08/30/2016 1412   BUN 14 10/28/2018 1031   BUN 18.4 08/30/2016 1412   CREATININE 0.99 10/28/2018 1031   CREATININE 0.8 08/30/2016 1412      Component Value Date/Time   CALCIUM 9.7 10/28/2018 1031   CALCIUM 10.2 08/30/2016 1412   ALKPHOS 67 10/28/2018 1031   ALKPHOS 83 08/30/2016 1412   AST 13 (L) 10/28/2018 1031   AST 15 08/30/2016 1412   ALT 16 10/28/2018 1031   ALT 19 08/30/2016 1412   BILITOT 0.4 10/28/2018 1031   BILITOT 0.40 08/30/2016 1412       Lab Results  Component Value Date   LABCA2 32 05/24/2011    No components found for: [id number]  No results for input(s): INR in the last 168 hours.  Urinalysis    Component Value Date/Time   COLORURINE YELLOW 05/20/2008 [place] 05/20/2008 1548   LABSPEC 1.008 05/20/2008 1548   PHURINE 6.5 05/20/2008 1548   GLUCOSEU NEGATIVE 05/20/2008 1548   HGBUR NEGATIVE 05/20/2008 1548   BILIRUBINUR NEGATIVE 05/20/2008 1548   KETONESUR NEGATIVE 05/20/2008 1548   PROTEINUR NEGATIVE 05/20/2008 1548   UROBILINOGEN 0.2 05/20/2008 1548   NITRITE NEGATIVE 05/20/2008 1548   LEUKOCYTESUR  05/20/2008 1548    NEGATIVE MICROSCOPIC NOT DONE ON URINES WITH NEGATIVE PROTEIN, BLOOD, LEUKOCYTES, NITRITE, OR GLUCOSE <1000 mg/dL.    STUDIES: CT scans and mammography results discussed with the patient  ASSESSMENT: 73 y.o. [place] woman status post right lumpectomy and sentinel lymph node sampling 04/08/2008 for a pT1c pN1, stage IIA invasive ductal carcinoma of overlapping sites, grade 2, estrogen receptor 99% and  progesterone receptor 51% positive, with an MIB-1 of 10%, and HER-2 amplification by CISH, with a ratio of 2.45.  (1) treated adjuvantly with carboplatin and Taxotere x2 cycles, poorly tolerated, followed by 4 cycles of weekly carboplatin Abraxane completed 07/21/2008  (a) received trastuzumab starting with chemotherapy, completing a year (see Dr Julien Girt note 12/11/2011)  (2) adjuvant radiation therapy to the right breast completed 10/30/2008  (3) started on  letrozole March of 2010, switched to anastrozole February of 2011 because of arthralgias/myalgias, anastrozole discontinued October 2016 when the patient was released from oncologic follow-up  (4) genetic testing sent 05/28/2014-- the following genes showed no abnormality: ATM, BARD1, BRCA1, BRCA2, BRIP1, CDH1, CHEK2, EPCAM, MLH1, MRE11A, MSH2, MSH6, MUTYH, NBN, NF1, PALB2, PMS2, PTEN, RAD50, RAD51C, RAD51D, SMARCA4, STK11, and TP53.  (5) the patient is status post TAH/BSO remotely   PLAN:  Shelly Simpson is now a little over 10 years out from definitive surgery for her breast cancer with no evidence of disease recurrence.  This is very favorable.  We reviewed the scans she had last year which of course show no evidence of disease recurrence.  We also looked at her mammogram from December and her labs from today  At this point I feel comfortable releasing her from my care.  However she is interested in pursuing our survivorship program and I have scheduled her to meet with my nurse practitioner a year from now.  I do not believe she will need lab work, but only general physical exam and review of health maintenance  All she will need in terms of breast cancer follow-up is a yearly mammography and a yearly physician breast exam  I will be glad to see Shelly Simpson at any point in the future if and when the need arises but as of now are making no further routine appointments for her with me here.  Magrinat, Virgie Dad, MD  10/28/18 11:19 AM Medical Oncology  and Hematology [facility] [address] [place], [place] [address] Tel. [phone]    Fax. [phone]  I, Jacqualyn Posey am acting as a Education administrator for Chauncey Cruel, MD.   I, Lurline Del MD, have reviewed the above documentation for accuracy and completeness, and I agree with the above.

## 2018-10-28 ENCOUNTER — Inpatient Hospital Stay (HOSPITAL_BASED_OUTPATIENT_CLINIC_OR_DEPARTMENT_OTHER): Payer: BLUE CROSS/BLUE SHIELD | Admitting: Oncology

## 2018-10-28 ENCOUNTER — Inpatient Hospital Stay: Payer: BLUE CROSS/BLUE SHIELD | Attending: Oncology

## 2018-10-28 ENCOUNTER — Telehealth: Payer: Self-pay | Admitting: Adult Health

## 2018-10-28 VITALS — BP 118/71 | HR 84 | Temp 98.4°F | Resp 18 | Ht 65.0 in | Wt 222.1 lb

## 2018-10-28 DIAGNOSIS — C50811 Malignant neoplasm of overlapping sites of right female breast: Secondary | ICD-10-CM

## 2018-10-28 DIAGNOSIS — Z853 Personal history of malignant neoplasm of breast: Secondary | ICD-10-CM

## 2018-10-28 DIAGNOSIS — I10 Essential (primary) hypertension: Secondary | ICD-10-CM | POA: Insufficient documentation

## 2018-10-28 DIAGNOSIS — Z170 Estrogen receptor positive status [ER+]: Principal | ICD-10-CM

## 2018-10-28 LAB — CBC WITH DIFFERENTIAL/PLATELET
Abs Immature Granulocytes: 0.03 10*3/uL (ref 0.00–0.07)
Basophils Absolute: 0.1 10*3/uL (ref 0.0–0.1)
Basophils Relative: 1 %
Eosinophils Absolute: 0.1 10*3/uL (ref 0.0–0.5)
Eosinophils Relative: 1 %
HEMATOCRIT: 45.2 % (ref 36.0–46.0)
HEMOGLOBIN: 14.5 g/dL (ref 12.0–15.0)
Immature Granulocytes: 0 %
LYMPHS ABS: 2.1 10*3/uL (ref 0.7–4.0)
LYMPHS PCT: 29 %
MCH: 30.3 pg (ref 26.0–34.0)
MCHC: 32.1 g/dL (ref 30.0–36.0)
MCV: 94.6 fL (ref 80.0–100.0)
MONO ABS: 0.5 10*3/uL (ref 0.1–1.0)
MONOS PCT: 7 %
Neutro Abs: 4.7 10*3/uL (ref 1.7–7.7)
Neutrophils Relative %: 62 %
Platelets: 278 10*3/uL (ref 150–400)
RBC: 4.78 MIL/uL (ref 3.87–5.11)
RDW: 11.6 % (ref 11.5–15.5)
WBC: 7.5 10*3/uL (ref 4.0–10.5)
nRBC: 0 % (ref 0.0–0.2)

## 2018-10-28 LAB — COMPREHENSIVE METABOLIC PANEL
ALBUMIN: 4.2 g/dL (ref 3.5–5.0)
ALT: 16 U/L (ref 0–44)
AST: 13 U/L — AB (ref 15–41)
Alkaline Phosphatase: 67 U/L (ref 38–126)
Anion gap: 11 (ref 5–15)
BUN: 14 mg/dL (ref 8–23)
CO2: 26 mmol/L (ref 22–32)
CREATININE: 0.99 mg/dL (ref 0.44–1.00)
Calcium: 9.7 mg/dL (ref 8.9–10.3)
Chloride: 104 mmol/L (ref 98–111)
GFR calc Af Amer: >60 mL/min (ref >60)
GFR, EST NON AFRICAN AMERICAN: 58 mL/min — AB (ref >60)
GLUCOSE: 121 mg/dL — AB (ref 70–99)
Potassium: 4.1 mmol/L (ref 3.5–5.1)
Sodium: 141 mmol/L (ref 135–145)
Total Bilirubin: 0.4 mg/dL (ref 0.3–1.2)
Total Protein: 7.3 g/dL (ref 6.5–8.1)

## 2018-10-28 NOTE — Telephone Encounter (Signed)
Gave avs and calendar ° °

## 2018-10-29 NOTE — Telephone Encounter (Signed)
Paperwork was given to husband in the office on Sat. 10/26/2018. Completed.

## 2018-11-14 ENCOUNTER — Ambulatory Visit: Payer: BLUE CROSS/BLUE SHIELD | Admitting: Family Medicine

## 2018-11-14 ENCOUNTER — Other Ambulatory Visit: Payer: Self-pay

## 2018-11-14 ENCOUNTER — Encounter: Payer: Self-pay | Admitting: Family Medicine

## 2018-11-14 VITALS — BP 122/70 | HR 96 | Temp 97.9°F | Resp 18 | Ht 64.69 in | Wt 224.0 lb

## 2018-11-14 DIAGNOSIS — E559 Vitamin D deficiency, unspecified: Secondary | ICD-10-CM | POA: Diagnosis not present

## 2018-11-14 DIAGNOSIS — I10 Essential (primary) hypertension: Secondary | ICD-10-CM

## 2018-11-14 DIAGNOSIS — Z760 Encounter for issue of repeat prescription: Secondary | ICD-10-CM

## 2018-11-14 DIAGNOSIS — R7301 Impaired fasting glucose: Secondary | ICD-10-CM | POA: Diagnosis not present

## 2018-11-14 DIAGNOSIS — R0981 Nasal congestion: Secondary | ICD-10-CM

## 2018-11-14 DIAGNOSIS — Z23 Encounter for immunization: Secondary | ICD-10-CM

## 2018-11-14 DIAGNOSIS — G40909 Epilepsy, unspecified, not intractable, without status epilepticus: Secondary | ICD-10-CM

## 2018-11-14 DIAGNOSIS — H6993 Unspecified Eustachian tube disorder, bilateral: Secondary | ICD-10-CM | POA: Diagnosis not present

## 2018-11-14 LAB — POCT GLYCOSYLATED HEMOGLOBIN (HGB A1C): Hemoglobin A1C: 5.6 % (ref 4.0–5.6)

## 2018-11-14 MED ORDER — OLMESARTAN MEDOXOMIL-HCTZ 20-12.5 MG PO TABS: 1.0000 | ORAL_TABLET | Freq: Every day | ORAL | 1 refills | Status: DC

## 2018-11-14 MED ORDER — FLUTICASONE PROPIONATE 50 MCG/ACT NA SUSP: 2.0000 | Freq: Every day | NASAL | 11 refills | Status: DC | PRN

## 2018-11-14 MED ORDER — METFORMIN HCL ER 500 MG PO TB24: ORAL_TABLET | ORAL | 1 refills | Status: DC

## 2018-11-14 MED ORDER — ALBUTEROL SULFATE HFA 108 (90 BASE) MCG/ACT IN AERS: 2.0000 | INHALATION_SPRAY | Freq: Four times a day (QID) | RESPIRATORY_TRACT | 2 refills | Status: DC | PRN

## 2018-11-14 MED ORDER — AMOXICILLIN-POT CLAVULANATE 875-125 MG PO TABS
1.0000 | ORAL_TABLET | Freq: Two times a day (BID) | ORAL | 0 refills | Status: DC
Start: 2018-11-14 — End: 2020-03-13

## 2018-11-14 MED ORDER — ATORVASTATIN CALCIUM 10 MG PO TABS: ORAL_TABLET | ORAL | 3 refills | Status: DC

## 2018-11-14 MED ORDER — PHENYTOIN SODIUM EXTENDED 100 MG PO CAPS: 300.0000 mg | ORAL_CAPSULE | Freq: Every day | ORAL | 3 refills | Status: DC

## 2018-11-14 NOTE — Patient Instructions (Addendum)
Increase your flonase to twice a day   Use a tub or basin and fill it with hot water, then drop some peppermint oil into the tub and cover your head with a towel to take a few whiffs of the steam. This will help open up the sinuses  Continue zyrtec  Use albuterol daily to help open up the airway to get the mucus up     If you have lab work done today you will be contacted with your lab results within the next 2 weeks.  If you have not heard from [place] then please contact us. The fastest way to get your results is to register for My Chart.   IF you received an x-ray today, you will receive an invoice from [facility]. Please contact [facility] at [phone] with questions or concerns regarding your invoice.   IF you received labwork today, you will receive an invoice from [facility]. Please contact [facility] at [phone] with questions or concerns regarding your invoice.   Our billing staff will not be able to assist you with questions regarding bills from these companies.  You will be contacted with the lab results as soon as they are available. The fastest way to get your results is to activate your My Chart account. Instructions are located on the last page of this paperwork. If you have not heard from [place] regarding the results in 2 weeks, please contact this office.     Eustachian Tube Dysfunction  Eustachian tube dysfunction refers to a condition in which a blockage develops in the narrow passage that connects the middle ear to the back of the nose (eustachian tube). The eustachian tube regulates air pressure in the middle ear by letting air move between the ear and nose. It also helps to drain fluid from the middle ear space. Eustachian tube dysfunction can affect one or both ears. When the eustachian tube does not function properly, air pressure, fluid, or both can build up in the middle ear. What are the causes? This condition occurs when the eustachian tube  becomes blocked or cannot open normally. Common causes of this condition include:  Ear infections.  Colds and other infections that affect the nose, mouth, and throat (upper respiratory tract).  Allergies.  Irritation from cigarette smoke.  Irritation from stomach acid coming up into the esophagus (gastroesophageal reflux). The esophagus is the tube that carries food from the mouth to the stomach.  Sudden changes in air pressure, such as from descending in an airplane or scuba diving.  Abnormal growths in the nose or throat, such as: ? Growths that line the nose (nasal polyps). ? Abnormal growth of cells (tumors). ? Enlarged tissue at the back of the throat (adenoids). What increases the risk? You are more likely to develop this condition if:  You smoke.  You are overweight.  You are a child who has: ? Certain birth defects of the mouth, such as cleft palate. ? Large tonsils or adenoids. What are the signs or symptoms? Common symptoms of this condition include:  A feeling of fullness in the ear.  Ear pain.  Clicking or popping noises in the ear.  Ringing in the ear.  Hearing loss.  Loss of balance.  Dizziness. Symptoms may get worse when the air pressure around you changes, such as when you travel to an area of high elevation, fly on an airplane, or go scuba diving. How is this diagnosed? This condition may be diagnosed based on:  Your symptoms.  A physical exam of your ears, nose, and throat.  Tests, such as those that measure: ? The movement of your eardrum (tympanogram). ? Your hearing (audiometry). How is this treated? Treatment depends on the cause and severity of your condition.  In mild cases, you may relieve your symptoms by moving air into your ears. This is called "popping the ears."  In more severe cases, or if you have symptoms of fluid in your ears, treatment may include: ? Medicines to relieve congestion (decongestants). ? Medicines that  treat allergies (antihistamines). ? Nasal sprays or ear drops that contain medicines that reduce swelling (steroids). ? A procedure to drain the fluid in your eardrum (myringotomy). In this procedure, a small tube is placed in the eardrum to:  Drain the fluid.  Restore the air in the middle ear space. ? A procedure to insert a balloon device through the nose to inflate the opening of the eustachian tube (balloon dilation). Follow these instructions at home: Lifestyle  Do not do any of the following until your health care provider approves: ? Travel to high altitudes. ? Fly in airplanes. ? Work in a Pension scheme manager or room. ? Scuba dive.  Do not use any products that contain nicotine or tobacco, such as cigarettes and e-cigarettes. If you need help quitting, ask your health care provider.  Keep your ears dry. Wear fitted earplugs during showering and bathing. Dry your ears completely after. General instructions  Take over-the-counter and prescription medicines only as told by your health care provider.  Use techniques to help pop your ears as recommended by your health care provider. These may include: ? Chewing gum. ? Yawning. ? Frequent, forceful swallowing. ? Closing your mouth, holding your nose closed, and gently blowing as if you are trying to blow air out of your nose.  Keep all follow-up visits as told by your health care provider. This is important. Contact a health care provider if:  Your symptoms do not go away after treatment.  Your symptoms come back after treatment.  You are unable to pop your ears.  You have: ? A fever. ? Pain in your ear. ? Pain in your head or neck. ? Fluid draining from your ear.  Your hearing suddenly changes.  You become very dizzy.  You lose your balance. Summary  Eustachian tube dysfunction refers to a condition in which a blockage develops in the eustachian tube.  It can be caused by ear infections, allergies, inhaled  irritants, or abnormal growths in the nose or throat.  Symptoms include ear pain, hearing loss, or ringing in the ears.  Mild cases are treated with maneuvers to unblock the ears, such as yawning or ear popping.  Severe cases are treated with medicines. Surgery may also be done (rare). This information is not intended to replace advice given to you by your health care provider. Make sure you discuss any questions you have with your health care provider. Document Released: 09/10/2015 Document Revised: 12/04/2017 Document Reviewed: 12/04/2017 Elsevier Interactive Patient Education  2019 [employer].

## 2018-11-28 ENCOUNTER — Ambulatory Visit: Payer: BLUE CROSS/BLUE SHIELD | Admitting: Family Medicine

## 2018-12-20 ENCOUNTER — Other Ambulatory Visit: Payer: Self-pay | Admitting: Family Medicine

## 2018-12-22 ENCOUNTER — Other Ambulatory Visit: Payer: Self-pay | Admitting: Family Medicine

## 2019-01-04 NOTE — Progress Notes (Signed)
Established Patient Office Visit  Subjective:  Patient ID: Shelly Simpson, female    DOB: 02/28/1946  Age: 73 y.o. MRN: [id number]  CC:  Chief Complaint  Patient presents with  . Cough    X 2 weeks  . Sinus Problem    X 2 weeks  . Medication Refill    ALL    HPI Shelly Simpson presents for   Patient with onset of symptoms 2 weeks ago She has cough, sinus drainage and pressure No fevers  No wheezing No hematemesis No posttussive emesis  Patient with pressure in her ears  Hypertension and Seizure- she has a history of hypertension and also has a history of seizure disorder No chest pain No palpitations She is avoiding stimulants in the medication  BP Readings from Last 3 Encounters:  11/14/18 122/70  10/28/18 118/71  03/06/18 132/79     Past Medical History:  Diagnosis Date  . Allergy   . Cancer ([facility])   . Family history of breast cancer in female   . Family history of ovarian cancer   . History of breast cancer   . Hypertension   . Seizures ([place])     Past Surgical History:  Procedure Laterality Date  . ABDOMINAL HYSTERECTOMY    . BREAST LUMPECTOMY Right 2009  . BREAST SURGERY    . JOINT REPLACEMENT    . SPINE SURGERY     laminectomy    Family History  Problem Relation Age of Onset  . Ovarian cancer Mother 11       also uterine sarcoma  . Breast cancer Daughter 33  . Other Father   . Breast cancer Maternal Uncle        female breast cancer; deceased 67  . Breast cancer Cousin 39       Currently 16  . Cancer Maternal Grandmother   . Heart disease Maternal Grandfather   . Hypertension Maternal Grandfather   . Hypertension Paternal Grandfather     Social History   Socioeconomic History  . Marital status: Married    Spouse name: Not on file  . Number of children: 2  . Years of education: Not on file  . Highest education level: Not on file  Occupational History  . Not on file  Social Needs  . Financial resource strain: Not on file  .  Food insecurity:    Worry: Not on file    Inability: Not on file  . Transportation needs:    Medical: Not on file    Non-medical: Not on file  Tobacco Use  . Smoking status: Never Smoker  . Smokeless tobacco: Never Used  Substance and Sexual Activity  . Alcohol use: No  . Drug use: No  . Sexual activity: Not on file  Lifestyle  . Physical activity:    Days per week: Not on file    Minutes per session: Not on file  . Stress: Not on file  Relationships  . Social connections:    Talks on phone: Not on file    Gets together: Not on file    Attends religious service: Not on file    Active member of club or organization: Not on file    Attends meetings of clubs or organizations: Not on file    Relationship status: Not on file  . Intimate partner violence:    Fear of current or ex partner: Not on file    Emotionally abused: Not on file    Physically  abused: Not on file    Forced sexual activity: Not on file  Other Topics Concern  . Not on file  Social History Narrative  . Not on file    Outpatient Medications Prior to Visit  Medication Sig Dispense Refill  . calcium citrate-vitamin D (CITRACAL+D) 315-200 MG-UNIT per tablet Take 1 tablet by mouth 2 (two) times daily.    . cetirizine (ZYRTEC) 10 MG tablet Take 10 mg by mouth daily as needed.     . Multiple Vitamin (MULTIVITAMIN) capsule Take 1 capsule by mouth daily.    Marland Kitchen albuterol (PROAIR HFA) 108 (90 Base) MCG/ACT inhaler Inhale 2 puffs into the lungs every 6 (six) hours as needed. 1 Inhaler 2  . atorvastatin (LIPITOR) 10 MG tablet TAKE 1 TABLET DAILY AT 6PM 90 tablet 1  . fluticasone (FLONASE) 50 MCG/ACT nasal spray Place 2 sprays into the nose daily as needed.     . metFORMIN (GLUCOPHAGE-XR) 500 MG 24 hr tablet TAKE 1 TABLET BY MOUTH EVERY DAY WITH BREAKFAST 90 tablet 0  . olmesartan-hydrochlorothiazide (BENICAR HCT) 20-12.5 MG tablet Take 1 tablet by mouth daily. 90 tablet 0  . phenytoin (DILANTIN) 100 MG ER capsule Take 3  capsules (300 mg total) by mouth at bedtime. 270 capsule 3   No facility-administered medications prior to visit.     Allergies  Allergen Reactions  . Iohexol      Desc: pt states in '80's wheezing; dyspnea w/ contrast--pt needs pre meds in future; slg 04/24/2008   . Sulfa Antibiotics     ROS Review of Systems    Objective:    Physical Exam  BP 122/70   Pulse 96   Temp 97.9 F (36.6 C) (Oral)   Resp 18   Ht 5' 4.69" (1.643 m)   Wt 224 lb (101.6 kg)   SpO2 96%   BMI 37.64 kg/m  Wt Readings from Last 3 Encounters:  11/14/18 224 lb (101.6 kg)  10/28/18 222 lb 1.6 oz (100.7 kg)  03/06/18 227 lb (103 kg)   General: alert, oriented, in NAD Head: normocephalic, atraumatic, no sinus tenderness Eyes: EOM intact, no scleral icterus or conjunctival injection Ears: TM clear bilaterally, bulging membranes bilaterally Nose: mucosa nonerythematous, nonedematous Throat: no pharyngeal exudate or erythema Lymph: no posterior auricular, submental or cervical lymph adenopathy Heart: normal rate, normal sinus rhythm, no murmurs Lungs: clear to auscultation bilaterally, no wheezing     Health Maintenance Due  Topic Date Due  . Hepatitis C Screening  03/24/1946  . OPHTHALMOLOGY EXAM  06/20/1958  . DEXA SCAN  06/20/2013  . PNA vac Low Risk Adult (2 of 2 - PPSV23) 11/14/2014    There are no preventive care reminders to display for this patient.  No results found for: TSH Lab Results  Component Value Date   WBC 7.5 10/28/2018   HGB 14.5 10/28/2018   HCT 45.2 10/28/2018   MCV 94.6 10/28/2018   PLT 278 10/28/2018   Lab Results  Component Value Date   NA 141 10/28/2018   K 4.1 10/28/2018   CHLORIDE 106 08/30/2016   CO2 26 10/28/2018   GLUCOSE 121 (H) 10/28/2018   BUN 14 10/28/2018   CREATININE 0.99 10/28/2018   BILITOT 0.4 10/28/2018   ALKPHOS 67 10/28/2018   AST 13 (L) 10/28/2018   ALT 16 10/28/2018   PROT 7.3 10/28/2018   ALBUMIN 4.2 10/28/2018   CALCIUM 9.7  10/28/2018   ANIONGAP 11 10/28/2018   EGFR 83 (L) 08/30/2016  Lab Results  Component Value Date   CHOL 209 (H) 10/24/2018   Lab Results  Component Value Date   HDL 65 10/24/2018   Lab Results  Component Value Date   LDLCALC 121 (H) 10/24/2018   Lab Results  Component Value Date   TRIG 116 10/24/2018   Lab Results  Component Value Date   CHOLHDL 3.2 10/24/2018   Lab Results  Component Value Date   HGBA1C 5.6 11/14/2018      Assessment & Plan:   Problem List Items Addressed This Visit    None    Visit Diagnoses    Eustachian tube disorder, bilateral    -  Primary Advised supportive care with antihistamine   Relevant Medications   amoxicillin-clavulanate (AUGMENTIN) 875-125 MG tablet   Vitamin D deficiency       Encounter for medication refill       Essential hypertension    -  Refilled meds today, reviewed the type of OTC cough meds that are safe to use   Relevant Medications   atorvastatin (LIPITOR) 10 MG tablet   olmesartan-hydrochlorothiazide (BENICAR HCT) 20-12.5 MG tablet   Seizure disorder (HCC)    -  Stable, refilled Dilantin   Relevant Medications   phenytoin (DILANTIN) 100 MG ER capsule   Impaired fasting glucose    -  Will screen for diabetes today   Relevant Orders   POCT glycosylated hemoglobin (Hb A1C) (Completed)   Need for vaccination       Sinus congestion    -  Will treat for sinus congestion but gave augmentin in case it progresses to sinusitis and reviewed those symptoms with the patient   Relevant Medications   amoxicillin-clavulanate (AUGMENTIN) 875-125 MG tablet      Meds ordered this encounter  Medications  . atorvastatin (LIPITOR) 10 MG tablet    Sig: TAKE 1 TABLET DAILY AT 6PM    Dispense:  90 tablet    Refill:  3  . DISCONTD: metFORMIN (GLUCOPHAGE-XR) 500 MG 24 hr tablet    Sig: TAKE 1 TABLET BY MOUTH EVERY DAY WITH BREAKFAST    Dispense:  90 tablet    Refill:  1  . olmesartan-hydrochlorothiazide (BENICAR HCT) 20-12.5 MG  tablet    Sig: Take 1 tablet by mouth daily.    Dispense:  90 tablet    Refill:  1  . phenytoin (DILANTIN) 100 MG ER capsule    Sig: Take 3 capsules (300 mg total) by mouth at bedtime.    Dispense:  270 capsule    Refill:  3  . fluticasone (FLONASE) 50 MCG/ACT nasal spray    Sig: Place 2 sprays into both nostrils daily as needed.    Dispense:  16 g    Refill:  11  . albuterol (PROAIR HFA) 108 (90 Base) MCG/ACT inhaler    Sig: Inhale 2 puffs into the lungs every 6 (six) hours as needed.    Dispense:  1 Inhaler    Refill:  2  . amoxicillin-clavulanate (AUGMENTIN) 875-125 MG tablet    Sig: Take 1 tablet by mouth 2 (two) times daily.    Dispense:  20 tablet    Refill:  0    Follow-up: Return in about 6 months (around 05/17/2019) for follow up for med check .    Forrest Moron, MD

## 2019-02-03 ENCOUNTER — Other Ambulatory Visit: Payer: Self-pay | Admitting: Family Medicine

## 2019-02-03 MED ORDER — AMOXICILLIN 500 MG PO TABS: ORAL_TABLET | ORAL | 0 refills | Status: DC

## 2019-02-03 NOTE — Telephone Encounter (Signed)
Copied from [place] [id number]. Topic: Quick Communication - Rx Refill/Question >> Feb 03, 2019 11:08 AM Reyne Dumas L wrote: Medication: amoxicillin-clavulanate (AUGMENTIN) 875-125 MG tablet  Pt states she broke a tooth and needs to have dental work done.  Has the patient contacted their pharmacy? no (Agent: If no, request that the patient contact the pharmacy for the refill.) (Agent: If yes, when and what did the pharmacy advise?)  Preferred Pharmacy (with phone number or street name): [id number] [address], [place] [phone] (Phone) [phone] (Fax)  Agent: Please be advised that RX refills may take up to 3 business days. We ask that you follow-up with your pharmacy.

## 2019-02-04 NOTE — Telephone Encounter (Signed)
Medication has been sent and pt informed

## 2019-02-18 NOTE — Telephone Encounter (Signed)
Patient says she needs 4 more Amoxicillin 875-125mg  tablet. Her 2nd appt is this Thursday 06/25. Please advise.

## 2019-02-19 ENCOUNTER — Other Ambulatory Visit: Payer: Self-pay | Admitting: Family Medicine

## 2019-02-19 ENCOUNTER — Encounter: Payer: Self-pay | Admitting: Family Medicine

## 2019-02-19 NOTE — Progress Notes (Unsigned)
Received phone call from nurse line Patient with joint replacement, knee Having dental surgery tomorrow morning, crown Had not heard from clinic yet regarding rx for prophylactic amoxicillin, dentist did not provide [facility]

## 2019-02-19 NOTE — Telephone Encounter (Signed)
Forwarding medication refill to PCP for review. 

## 2019-03-28 ENCOUNTER — Ambulatory Visit: Payer: BLUE CROSS/BLUE SHIELD | Admitting: Orthopaedic Surgery

## 2019-04-25 ENCOUNTER — Telehealth: Payer: Self-pay | Admitting: Family Medicine

## 2019-04-25 NOTE — Telephone Encounter (Signed)
MED REFILL atorvastatin (LIPITOR) 10  PHARMACY [id number] [address], [place] (Phone) [phone] (Fax)

## 2019-04-30 ENCOUNTER — Other Ambulatory Visit: Payer: Self-pay | Admitting: Family Medicine

## 2019-04-30 NOTE — Telephone Encounter (Signed)
Forwarding medication refill request to the clinical pool for review. 

## 2019-05-02 NOTE — Telephone Encounter (Signed)
Spoke with pt she states she has received refills, I verbalized understanding.

## 2019-07-16 ENCOUNTER — Other Ambulatory Visit: Payer: Self-pay | Admitting: Oncology

## 2019-08-20 ENCOUNTER — Other Ambulatory Visit: Payer: Self-pay

## 2019-08-20 ENCOUNTER — Ambulatory Visit (INDEPENDENT_AMBULATORY_CARE_PROVIDER_SITE_OTHER): Payer: BC Managed Care – PPO | Admitting: Orthopaedic Surgery

## 2019-08-20 ENCOUNTER — Encounter: Payer: Self-pay | Admitting: Orthopaedic Surgery

## 2019-08-20 ENCOUNTER — Ambulatory Visit: Payer: Self-pay

## 2019-08-20 VITALS — Ht 66.0 in | Wt 220.0 lb

## 2019-08-20 DIAGNOSIS — M25512 Pain in left shoulder: Secondary | ICD-10-CM | POA: Diagnosis not present

## 2019-08-20 MED ORDER — METHYLPREDNISOLONE ACETATE 40 MG/ML IJ SUSP
80.0000 mg | INTRAMUSCULAR | Status: AC | PRN
  Administered 2019-08-20: 80 mg via INTRA_ARTICULAR

## 2019-08-20 MED ORDER — BUPIVACAINE HCL 0.5 % IJ SOLN
2.0000 mL | INTRAMUSCULAR | Status: AC | PRN
  Administered 2019-08-20: 16:00:00 2 mL via INTRA_ARTICULAR

## 2019-08-20 MED ORDER — LIDOCAINE HCL 2 % IJ SOLN
2.0000 mL | INTRAMUSCULAR | Status: AC | PRN
  Administered 2019-08-20: 16:00:00 2 mL

## 2019-08-20 NOTE — Progress Notes (Signed)
Office Visit Note   Patient: Shelly Simpson           Date of Birth: 1946/05/20           MRN: [id number] Visit Date: 08/20/2019              Requested by: Forrest Moron, MD [place],  [place] [id number] PCP: Forrest Moron, MD   Assessment & Plan: Visit Diagnoses:  1. Acute pain of left shoulder     Plan: Impingement syndrome left shoulder with the possibility of a small rotator cuff tear.  Long discussion regarding potential diagnoses.  Films were negative.  Will inject the subacromial space left shoulder with cortisone and monitor response.  If no improvement over the next 3 weeks consider an MRI scan  Follow-Up Instructions: Return if symptoms worsen or fail to improve.   Orders:  Orders Placed This Encounter  Procedures  . Large Joint Inj: L subacromial bursa  . XR Shoulder Left   No orders of the defined types were placed in this encounter.     Procedures: Large Joint Inj: L subacromial bursa on 08/20/2019 4:03 PM Indications: pain and diagnostic evaluation Details: 25 G 1.5 in needle, anterolateral approach  Arthrogram: No  Medications: 2 mL lidocaine 2 %; 2 mL bupivacaine 0.5 %; 80 mg methylPREDNISolone acetate 40 MG/ML Consent was given by the patient. Immediately prior to procedure a time out was called to verify the correct patient, procedure, equipment, support staff and site/side marked as required. Patient was prepped and draped in the usual sterile fashion.       Clinical Data: No additional findings.   Subjective: Chief Complaint  Patient presents with  . Left Shoulder - Pain  Patient presents today for left shoulder pain. She injured it 2-3weeks ago. She was taking a shower and and slipped while walking backward. She landed with more weight on her left arm. She tried ice and heat. She has taken Advil. She has decreased range of motion and hurts with any movement. She has noticed that there are times that her shoulder will pop when  she lifts her arm. No numbness or tingling down her arm. She has not had it evaluated since the fall. She is right hand dominant.   Shelly Simpson relates that she had an injury 2 to 3 weeks ago when she was in the shower.  She was taking a tub shower when she went to turn off the water she slipped and landed on the edge of the tub with her left arm.  She has been experiencing difficulty raising her left shoulder overhead with pain and even sleeping on that side.  She is tried ice heat and Advil.  Over the last week she is actually improved to the point where her pain is a 3 or a 4.  No bruising.  No numbness or tingling  HPI  Review of Systems   Objective: Vital Signs: Ht 5\' 6"  (1.676 m)   Wt 220 lb (99.8 kg)   BMI 35.51 kg/m   Physical Exam Constitutional:      Appearance: She is well-developed.  Eyes:     Pupils: Pupils are equal, round, and reactive to light.  Pulmonary:     Effort: Pulmonary effort is normal.  Skin:    General: Skin is warm and dry.  Neurological:     Mental Status: She is alert and oriented to person, place, and time.  Psychiatric:  Behavior: Behavior normal.     Ortho Exam awake alert and oriented x3.  Comfortable sitting.  Able to place left arm overhead but with a circuitous arc of motion.  Very mild pain with impingement testing and with empty can testing.  Biceps intact.  Good grip and good release  Specialty Comments:  No specialty comments available.  Imaging: XR Shoulder Left  Result Date: 08/20/2019 Films of the left shoulder taken several projections.  The humeral head is centered at the glenoid.  Normal space between the humeral head and the acromion.  Some mild lateral downsloping.  Some degenerative changes at the [facility] joint.  No acute changes or ectopic calcification.  No evidence of osteoarthritis of the humeral head    PMFS History: Patient Active Problem List   Diagnosis Date Noted  . Pain in left shoulder 08/20/2019  .  Unilateral primary osteoarthritis, left knee 06/12/2017  . Malignant neoplasm of overlapping sites of right breast in female, estrogen receptor positive ([place]) 06/25/2014  . History of breast cancer   . Family history of ovarian cancer   . Family history of breast cancer in female    Past Medical History:  Diagnosis Date  . Allergy   . Cancer ([facility])   . Family history of breast cancer in female   . Family history of ovarian cancer   . History of breast cancer   . Hypertension   . Seizures ([facility])     Family History  Problem Relation Age of Onset  . Ovarian cancer Mother 43       also uterine sarcoma  . Breast cancer Daughter 42  . Other Father   . Breast cancer Maternal Uncle        female breast cancer; deceased 94  . Breast cancer Cousin 65       Currently 51  . Cancer Maternal Grandmother   . Heart disease Maternal Grandfather   . Hypertension Maternal Grandfather   . Hypertension Paternal Grandfather     Past Surgical History:  Procedure Laterality Date  . ABDOMINAL HYSTERECTOMY    . BREAST LUMPECTOMY Right 2009  . BREAST SURGERY    . JOINT REPLACEMENT    . SPINE SURGERY     laminectomy   Social History   Occupational History  . Not on file  Tobacco Use  . Smoking status: Never Smoker  . Smokeless tobacco: Never Used  Substance and Sexual Activity  . Alcohol use: No  . Drug use: No  . Sexual activity: Not on file

## 2019-09-18 ENCOUNTER — Telehealth: Payer: Self-pay | Admitting: Adult Health

## 2019-09-18 NOTE — Telephone Encounter (Signed)
Returned patient's phone call regarding cancelling March appointment, per patient's request appointment has been cancelled.

## 2019-10-15 ENCOUNTER — Other Ambulatory Visit: Payer: Self-pay | Admitting: Family Medicine

## 2019-10-15 ENCOUNTER — Telehealth: Payer: Self-pay | Admitting: Family Medicine

## 2019-10-15 NOTE — Telephone Encounter (Signed)
Requested medication (s) are due for refill today: yes  Requested medication (s) are on the active medication list: yes  Last refill: 07/16/2019  Future visit scheduled: no  Notes to clinic:  no valid encounter within last 6 months    Requested Prescriptions  Pending Prescriptions Disp Refills   olmesartan-hydrochlorothiazide (BENICAR HCT) 20-12.5 MG tablet [Pharmacy Med Name: OLMESART HCT TAB 20-12.5] 90 tablet 1    Sig: TAKE 1 TABLET DAILY      Cardiovascular: ARB + Diuretic Combos Failed - 10/15/2019 11:14 AM      Failed - K in normal range and within 180 days    Potassium  Date Value Ref Range Status  10/28/2018 4.1 3.5 - 5.1 mmol/L Final  08/30/2016 4.1 3.5 - 5.1 mEq/L Final          Failed - Na in normal range and within 180 days    Sodium  Date Value Ref Range Status  10/28/2018 141 135 - 145 mmol/L Final  08/30/2016 141 136 - 145 mEq/L Final          Failed - Cr in normal range and within 180 days    Creatinine  Date Value Ref Range Status  08/30/2016 0.8 0.6 - 1.1 mg/dL Final   Creatinine, Ser  Date Value Ref Range Status  10/28/2018 0.99 0.44 - 1.00 mg/dL Final          Failed - Ca in normal range and within 180 days    Calcium  Date Value Ref Range Status  10/28/2018 9.7 8.9 - 10.3 mg/dL Final  08/30/2016 10.2 8.4 - 10.4 mg/dL Final   Calcium, Ion  Date Value Ref Range Status  03/03/2018 1.20 1.15 - 1.40 mmol/L Final          Failed - Valid encounter within last 6 months    Recent Outpatient Visits           11 months ago Eustachian tube disorder, bilateral   Primary Care at [facility], Zoe A, MD   11 months ago Hyperlipidemia, unspecified hyperlipidemia type   Primary Care at [facility]   1 year ago Pressure in head   Primary Care at [facility], Arlie Solomons, MD   2 years ago Acute URI   Primary Care at [facility], [place] A, MD   5 years ago Acute maxillary sinusitis, recurrence not specified   [facility] at [facility],  Janalee Dane, MD              Passed - Patient is not pregnant      Passed - Last BP in normal range    BP Readings from Last 1 Encounters:  11/14/18 122/70            metFORMIN (GLUCOPHAGE-XR) 500 MG 24 hr tablet [Pharmacy Med Name: METFORMIN ER TAB 500MG GP] 90 tablet 1    Sig: TAKE 1 TABLET DAILY WITH   BREAKFAST      Endocrinology:  Diabetes - Biguanides Failed - 10/15/2019 11:14 AM      Failed - HBA1C is between 0 and 7.9 and within 180 days    Hemoglobin A1C  Date Value Ref Range Status  11/14/2018 5.6 4.0 - 5.6 % Final          Failed - Valid encounter within last 6 months    Recent Outpatient Visits           11 months ago Eustachian tube disorder, bilateral   Primary Care at [facility],  Zoe A, MD   11 months ago Hyperlipidemia, unspecified hyperlipidemia type   Primary Care at [facility]   1 year ago Pressure in head   Primary Care at [facility], [place] A, MD   2 years ago Acute URI   Primary Care at [facility], Arlie Solomons, MD   5 years ago Acute maxillary sinusitis, recurrence not specified   [facility] at [facility], Janalee Dane, MD              Passed - Cr in normal range and within 360 days    Creatinine  Date Value Ref Range Status  08/30/2016 0.8 0.6 - 1.1 mg/dL Final   Creatinine, Ser  Date Value Ref Range Status  10/28/2018 0.99 0.44 - 1.00 mg/dL Final          Passed - eGFR in normal range and within 360 days    GFR calc Af Amer  Date Value Ref Range Status  10/28/2018 >60 >60 mL/min Final   GFR calc non Af Amer  Date Value Ref Range Status  10/28/2018 58 (L) >60 mL/min Final   EGFR  Date Value Ref Range Status  08/30/2016 83 (L) >90 ml/min/1.73 m2 Final    Comment:    eGFR is calculated using the CKD-EPI Creatinine Equation (2009)

## 2019-10-15 NOTE — Telephone Encounter (Addendum)
Pt is calling and needs refills on metformin, atorvastatin, phenytoin and olmesartan-hctz.#90 w/ref[facility]. Pt does not want to come into office until she has had her covid 19 vaccines. Pt is sch for the first one at [facility] on 10-21-2019

## 2019-10-20 ENCOUNTER — Other Ambulatory Visit: Payer: Self-pay | Admitting: Family Medicine

## 2019-10-20 ENCOUNTER — Other Ambulatory Visit: Payer: Self-pay

## 2019-10-20 DIAGNOSIS — E785 Hyperlipidemia, unspecified: Secondary | ICD-10-CM

## 2019-10-20 DIAGNOSIS — I10 Essential (primary) hypertension: Secondary | ICD-10-CM

## 2019-10-20 DIAGNOSIS — G40909 Epilepsy, unspecified, not intractable, without status epilepticus: Secondary | ICD-10-CM

## 2019-10-20 MED ORDER — PHENYTOIN SODIUM EXTENDED 100 MG PO CAPS: 300.0000 mg | ORAL_CAPSULE | Freq: Every day | ORAL | 3 refills | Status: DC

## 2019-10-20 MED ORDER — OLMESARTAN MEDOXOMIL-HCTZ 20-12.5 MG PO TABS: 1.0000 | ORAL_TABLET | Freq: Every day | ORAL | 0 refills | Status: DC

## 2019-10-20 MED ORDER — METFORMIN HCL ER 500 MG PO TB24: ORAL_TABLET | ORAL | 0 refills | Status: DC

## 2019-10-20 MED ORDER — ATORVASTATIN CALCIUM 10 MG PO TABS: ORAL_TABLET | ORAL | 0 refills | Status: DC

## 2019-10-20 NOTE — Telephone Encounter (Signed)
Pt meds were filled, the dilantin was sent to the doctor to send separately

## 2019-10-28 ENCOUNTER — Encounter: Payer: BLUE CROSS/BLUE SHIELD | Admitting: Adult Health

## 2019-12-23 ENCOUNTER — Other Ambulatory Visit: Payer: Self-pay | Admitting: Family Medicine

## 2019-12-23 NOTE — Telephone Encounter (Signed)
Requested Prescriptions  Pending Prescriptions Disp Refills  . fluticasone (FLONASE) 50 MCG/ACT nasal spray [Pharmacy Med Name: FLUTICASONE PROP 50 MCG SPRAY] 16 mL 0    Sig: PLACE 2 SPRAYS INTO BOTH NOSTRILS DAILY AS NEEDED.     Ear, Nose, and Throat: Nasal Preparations - Corticosteroids Failed - 12/23/2019  3:11 AM      Failed - Valid encounter within last 12 months    Recent Outpatient Visits          1 year ago Eustachian tube disorder, bilateral   Primary Care at [facility], Zoe A, MD   1 year ago Hyperlipidemia, unspecified hyperlipidemia type   Primary Care at [facility]   1 year ago Pressure in head   Primary Care at [facility], Arlie Solomons, MD   2 years ago Acute URI   Primary Care at [facility], [place] A, MD   5 years ago Acute maxillary sinusitis, recurrence not specified   [facility] at [facility], Janalee Dane, MD      Future Appointments            In 2 weeks Forrest Moron, MD Primary Care at [place], [facility]

## 2020-01-06 DIAGNOSIS — H43813 Vitreous degeneration, bilateral: Secondary | ICD-10-CM | POA: Diagnosis not present

## 2020-01-06 DIAGNOSIS — H35033 Hypertensive retinopathy, bilateral: Secondary | ICD-10-CM | POA: Diagnosis not present

## 2020-01-06 DIAGNOSIS — H40053 Ocular hypertension, bilateral: Secondary | ICD-10-CM | POA: Diagnosis not present

## 2020-01-06 DIAGNOSIS — H25813 Combined forms of age-related cataract, bilateral: Secondary | ICD-10-CM | POA: Diagnosis not present

## 2020-01-06 DIAGNOSIS — H40013 Open angle with borderline findings, low risk, bilateral: Secondary | ICD-10-CM | POA: Diagnosis not present

## 2020-01-07 ENCOUNTER — Ambulatory Visit: Payer: BC Managed Care – PPO | Admitting: Family Medicine

## 2020-01-07 ENCOUNTER — Encounter: Payer: BLUE CROSS/BLUE SHIELD | Admitting: Family Medicine

## 2020-01-07 ENCOUNTER — Other Ambulatory Visit: Payer: Self-pay

## 2020-01-07 VITALS — BP 132/79 | HR 83 | Temp 97.6°F | Ht 65.0 in | Wt 219.6 lb

## 2020-01-07 DIAGNOSIS — G40909 Epilepsy, unspecified, not intractable, without status epilepticus: Secondary | ICD-10-CM | POA: Diagnosis not present

## 2020-01-07 DIAGNOSIS — I10 Essential (primary) hypertension: Secondary | ICD-10-CM

## 2020-01-07 DIAGNOSIS — E785 Hyperlipidemia, unspecified: Secondary | ICD-10-CM | POA: Diagnosis not present

## 2020-01-07 DIAGNOSIS — Z135 Encounter for screening for eye and ear disorders: Secondary | ICD-10-CM

## 2020-01-07 DIAGNOSIS — R739 Hyperglycemia, unspecified: Secondary | ICD-10-CM

## 2020-01-07 DIAGNOSIS — Z1159 Encounter for screening for other viral diseases: Secondary | ICD-10-CM | POA: Diagnosis not present

## 2020-01-07 MED ORDER — ALBUTEROL SULFATE HFA 108 (90 BASE) MCG/ACT IN AERS: 2.0000 | INHALATION_SPRAY | Freq: Four times a day (QID) | RESPIRATORY_TRACT | 3 refills | Status: DC | PRN

## 2020-01-07 MED ORDER — METFORMIN HCL ER 500 MG PO TB24: ORAL_TABLET | ORAL | 0 refills | Status: DC

## 2020-01-07 MED ORDER — OLMESARTAN MEDOXOMIL-HCTZ 20-12.5 MG PO TABS: 1.0000 | ORAL_TABLET | Freq: Every day | ORAL | 3 refills | Status: DC

## 2020-01-07 MED ORDER — PHENYTOIN SODIUM EXTENDED 100 MG PO CAPS: 300.0000 mg | ORAL_CAPSULE | Freq: Every day | ORAL | 3 refills | Status: DC

## 2020-01-07 MED ORDER — ATORVASTATIN CALCIUM 10 MG PO TABS: ORAL_TABLET | ORAL | 3 refills | Status: DC

## 2020-01-07 NOTE — Progress Notes (Unsigned)
Established Patient Office Visit  Subjective:  Patient ID: Shelly Simpson, female    DOB: 06-12-1946  Age: 74 y.o. MRN: [id number]  CC:  Chief Complaint  Patient presents with  . Medication Refill    HPI [name] presents for   Seizure disorder Pt is taking her phenytoin She states that she has not missed any doses No seizure activity  Hypertension: Patient here for follow-up of elevated blood pressure. She is exercising and is adherent to low salt diet.  Blood pressure is well controlled at home. Cardiac symptoms none. Patient denies chest pain, claudication, exertional chest pressure/discomfort, irregular heart beat and lower extremity edema.  Cardiovascular risk factors: advanced age (older than 50 for men, 61 for women) and hypertension. Use of agents associated with hypertension: none. History of target organ damage: none. BP Readings from Last 3 Encounters:  01/07/20 132/79  11/14/18 122/70  10/28/18 118/71     Past Medical History:  Diagnosis Date  . Allergy   . Cancer ([place])   . Family history of breast cancer in female   . Family history of ovarian cancer   . History of breast cancer   . Hypertension   . Seizures ([place])     Past Surgical History:  Procedure Laterality Date  . ABDOMINAL HYSTERECTOMY    . BREAST LUMPECTOMY Right 2009  . BREAST SURGERY    . JOINT REPLACEMENT    . SPINE SURGERY     laminectomy    Family History  Problem Relation Age of Onset  . Ovarian cancer Mother 28       also uterine sarcoma  . Breast cancer Daughter 51  . Other Father   . Breast cancer Maternal Uncle        female breast cancer; deceased 72  . Breast cancer Cousin 51       Currently 63  . Cancer Maternal Grandmother   . Heart disease Maternal Grandfather   . Hypertension Maternal Grandfather   . Hypertension Paternal Grandfather     Social History   Socioeconomic History  . Marital status: Married    Spouse name: Not on file  . Number of children: 2   . Years of education: Not on file  . Highest education level: Not on file  Occupational History  . Not on file  Tobacco Use  . Smoking status: Never Smoker  . Smokeless tobacco: Never Used  Substance and Sexual Activity  . Alcohol use: No  . Drug use: No  . Sexual activity: Not on file  Other Topics Concern  . Not on file  Social History Narrative  . Not on file   Social Determinants of Health   Financial Resource Strain:   . Difficulty of Paying Living Expenses:   Food Insecurity:   . Worried About Charity fundraiser in the Last Year:   . Arboriculturist in the Last Year:   Transportation Needs:   . Film/video editor (Medical):   Marland Kitchen Lack of Transportation (Non-Medical):   Physical Activity:   . Days of Exercise per Week:   . Minutes of Exercise per Session:   Stress:   . Feeling of Stress :   Social Connections:   . Frequency of Communication with Friends and Family:   . Frequency of Social Gatherings with Friends and Family:   . Attends Religious Services:   . Active Member of Clubs or Organizations:   . Attends Archivist Meetings:   .  Marital Status:   Intimate Partner Violence:   . Fear of Current or Ex-Partner:   . Emotionally Abused:   Marland Kitchen Physically Abused:   . Sexually Abused:     Outpatient Medications Prior to Visit  Medication Sig Dispense Refill  . amoxicillin (AMOXIL) 500 MG tablet TAKE 4 TABLETS BY MOUTH FOR ONE DOSE 1 HR PRIOR TO DENTAL SURGERY 4 tablet 0  . amoxicillin-clavulanate (AUGMENTIN) 875-125 MG tablet Take 1 tablet by mouth 2 (two) times daily. 20 tablet 0  . calcium citrate-vitamin D (CITRACAL+D) 315-200 MG-UNIT per tablet Take 1 tablet by mouth 2 (two) times daily.    . cetirizine (ZYRTEC) 10 MG tablet Take 10 mg by mouth daily as needed.     . fluticasone (FLONASE) 50 MCG/ACT nasal spray PLACE 2 SPRAYS INTO BOTH NOSTRILS DAILY AS NEEDED. 16 mL 0  . Multiple Vitamin (MULTIVITAMIN) capsule Take 1 capsule by mouth daily.     Marland Kitchen albuterol (PROAIR HFA) 108 (90 Base) MCG/ACT inhaler Inhale 2 puffs into the lungs every 6 (six) hours as needed. 1 Inhaler 2  . atorvastatin (LIPITOR) 10 MG tablet TAKE 1 TABLET DAILY AT 6PM 30 tablet 0  . metFORMIN (GLUCOPHAGE-XR) 500 MG 24 hr tablet TAKE 1 TABLET DAILY WITH   BREAKFAST 30 tablet 0  . olmesartan-hydrochlorothiazide (BENICAR HCT) 20-12.5 MG tablet Take 1 tablet by mouth daily. 30 tablet 0  . phenytoin (DILANTIN) 100 MG ER capsule Take 3 capsules (300 mg total) by mouth at bedtime. 270 capsule 3   No facility-administered medications prior to visit.    Allergies  Allergen Reactions  . Iohexol      Desc: pt states in '80's wheezing; dyspnea w/ contrast--pt needs pre meds in future; slg 04/24/2008   . Sulfa Antibiotics     ROS Review of Systems    Objective:    Physical Exam  BP 132/79 (BP Location: Right Arm, Patient Position: Sitting, Cuff Size: Large)   Pulse 83   Temp 97.6 F (36.4 C) (Temporal)   Ht '5\' 5"'  (1.651 m)   Wt 219 lb 9.6 oz (99.6 kg)   SpO2 97%   BMI 36.54 kg/m  Wt Readings from Last 3 Encounters:  01/07/20 219 lb 9.6 oz (99.6 kg)  08/20/19 220 lb (99.8 kg)  11/14/18 224 lb (101.6 kg)     Health Maintenance Due  Topic Date Due  . Hepatitis C Screening  Never done  . OPHTHALMOLOGY EXAM  Never done  . DEXA SCAN  Never done  . PNA vac Low Risk Adult (2 of 2 - PPSV23) 11/14/2014  . HEMOGLOBIN A1C  05/17/2019    There are no preventive care reminders to display for this patient.  No results found for: TSH Lab Results  Component Value Date   WBC 7.5 10/28/2018   HGB 14.5 10/28/2018   HCT 45.2 10/28/2018   MCV 94.6 10/28/2018   PLT 278 10/28/2018   Lab Results  Component Value Date   NA 141 10/28/2018   K 4.1 10/28/2018   CHLORIDE 106 08/30/2016   CO2 26 10/28/2018   GLUCOSE 121 (H) 10/28/2018   BUN 14 10/28/2018   CREATININE 0.99 10/28/2018   BILITOT 0.4 10/28/2018   ALKPHOS 67 10/28/2018   AST 13 (L) 10/28/2018   ALT  16 10/28/2018   PROT 7.3 10/28/2018   ALBUMIN 4.2 10/28/2018   CALCIUM 9.7 10/28/2018   ANIONGAP 11 10/28/2018   EGFR 83 (L) 08/30/2016   Lab Results  Component Value  Date   CHOL 209 (H) 10/24/2018   Lab Results  Component Value Date   HDL 65 10/24/2018   Lab Results  Component Value Date   LDLCALC 121 (H) 10/24/2018   Lab Results  Component Value Date   TRIG 116 10/24/2018   Lab Results  Component Value Date   CHOLHDL 3.2 10/24/2018   Lab Results  Component Value Date   HGBA1C 5.6 11/14/2018      Assessment & Plan:   Problem List Items Addressed This Visit    None    Visit Diagnoses    Hyperlipidemia, unspecified hyperlipidemia type    -  Primary   Relevant Medications   atorvastatin (LIPITOR) 10 MG tablet   olmesartan-hydrochlorothiazide (BENICAR HCT) 20-12.5 MG tablet   Other Relevant Orders   Comprehensive metabolic panel   Lipid panel   Essential hypertension       Relevant Medications   atorvastatin (LIPITOR) 10 MG tablet   olmesartan-hydrochlorothiazide (BENICAR HCT) 20-12.5 MG tablet   Other Relevant Orders   Comprehensive metabolic panel   Encounter for hepatitis C screening test for low risk patient       Relevant Orders   HCV Ab w Reflex to Quant PCR   Screening for diabetic retinopathy       Elevated blood sugar       Relevant Orders   HM Diabetes Foot Exam (Completed)   Seizure disorder (HCC)       Relevant Medications   phenytoin (DILANTIN) 100 MG ER capsule   Other Relevant Orders   Phenytoin level, free      Meds ordered this encounter  Medications  . albuterol (PROAIR HFA) 108 (90 Base) MCG/ACT inhaler    Sig: Inhale 2 puffs into the lungs every 6 (six) hours as needed.    Dispense:  18 g    Refill:  3  . atorvastatin (LIPITOR) 10 MG tablet    Sig: TAKE 1 TABLET DAILY AT 6PM    Dispense:  90 tablet    Refill:  3  . olmesartan-hydrochlorothiazide (BENICAR HCT) 20-12.5 MG tablet    Sig: Take 1 tablet by mouth daily.     Dispense:  90 tablet    Refill:  3  . phenytoin (DILANTIN) 100 MG ER capsule    Sig: Take 3 capsules (300 mg total) by mouth at bedtime.    Dispense:  270 capsule    Refill:  3  . metFORMIN (GLUCOPHAGE-XR) 500 MG 24 hr tablet    Sig: TAKE 1 TABLET DAILY WITH   BREAKFAST    Dispense:  30 tablet    Refill:  0    Follow-up: No follow-ups on file.    Forrest Moron, MD

## 2020-01-07 NOTE — Patient Instructions (Signed)
° ° ° °  If you have lab work done today you will be contacted with your lab results within the next 2 weeks.  If you have not heard from us then please contact us. The fastest way to get your results is to register for My Chart. ° ° °IF you received an x-ray today, you will receive an invoice from [facility]. Please contact [facility] at [phone] with questions or concerns regarding your invoice.  ° °IF you received labwork today, you will receive an invoice from [facility]. Please contact [facility] at [phone] with questions or concerns regarding your invoice.  ° °Our billing staff will not be able to assist you with questions regarding bills from these companies. ° °You will be contacted with the lab results as soon as they are available. The fastest way to get your results is to activate your My Chart account. Instructions are located on the last page of this paperwork. If you have not heard from us regarding the results in 2 weeks, please contact this office. °  ° ° ° °

## 2020-01-08 LAB — COMPREHENSIVE METABOLIC PANEL
ALT: 19 IU/L (ref 0–32)
AST: 18 IU/L (ref 0–40)
Albumin/Globulin Ratio: 2 (ref 1.2–2.2)
Albumin: 5 g/dL — ABNORMAL HIGH (ref 3.7–4.7)
Alkaline Phosphatase: 76 IU/L (ref 39–117)
BUN/Creatinine Ratio: 17 (ref 12–28)
BUN: 15 mg/dL (ref 8–27)
Bilirubin Total: 0.3 mg/dL (ref 0.0–1.2)
CO2: 22 mmol/L (ref 20–29)
Calcium: 10.4 mg/dL — ABNORMAL HIGH (ref 8.7–10.3)
Chloride: 102 mmol/L (ref 96–106)
Creatinine, Ser: 0.87 mg/dL (ref 0.57–1.00)
GFR calc Af Amer: 78 mL/min/{1.73_m2} (ref >59)
GFR calc non Af Amer: 67 mL/min/{1.73_m2} (ref >59)
Globulin, Total: 2.5 g/dL (ref 1.5–4.5)
Glucose: 101 mg/dL — ABNORMAL HIGH (ref 65–99)
Potassium: 4.4 mmol/L (ref 3.5–5.2)
Sodium: 143 mmol/L (ref 134–144)
Total Protein: 7.5 g/dL (ref 6.0–8.5)

## 2020-01-08 LAB — LIPID PANEL
Chol/HDL Ratio: 3.3 ratio (ref 0.0–4.4)
Cholesterol, Total: 202 mg/dL — ABNORMAL HIGH (ref 100–199)
HDL: 61 mg/dL (ref >39)
LDL Chol Calc (NIH): 113 mg/dL — ABNORMAL HIGH (ref 0–99)
Triglycerides: 162 mg/dL — ABNORMAL HIGH (ref 0–149)
VLDL Cholesterol Cal: 28 mg/dL (ref 5–40)

## 2020-01-08 LAB — HCV INTERPRETATION

## 2020-01-08 LAB — PHENYTOIN LEVEL, FREE: Phenytoin, Free: 0.7 ug/mL — ABNORMAL LOW (ref 1.0–2.0)

## 2020-01-08 LAB — HCV AB W REFLEX TO QUANT PCR: HCV Ab: 0.1 s/co ratio (ref 0.0–0.9)

## 2020-01-15 ENCOUNTER — Other Ambulatory Visit: Payer: Self-pay | Admitting: Adult Health

## 2020-01-15 ENCOUNTER — Other Ambulatory Visit: Payer: Self-pay | Admitting: *Deleted

## 2020-01-15 DIAGNOSIS — C50811 Malignant neoplasm of overlapping sites of right female breast: Secondary | ICD-10-CM

## 2020-01-16 ENCOUNTER — Other Ambulatory Visit: Payer: Self-pay | Admitting: Adult Health

## 2020-01-16 ENCOUNTER — Other Ambulatory Visit: Payer: Self-pay | Admitting: Oncology

## 2020-01-16 ENCOUNTER — Other Ambulatory Visit: Payer: Self-pay | Admitting: Family Medicine

## 2020-01-16 DIAGNOSIS — C50811 Malignant neoplasm of overlapping sites of right female breast: Secondary | ICD-10-CM

## 2020-01-16 DIAGNOSIS — E2839 Other primary ovarian failure: Secondary | ICD-10-CM

## 2020-01-16 DIAGNOSIS — Z170 Estrogen receptor positive status [ER+]: Secondary | ICD-10-CM

## 2020-01-16 NOTE — Telephone Encounter (Signed)
Requested medication (s) are due for refill today -yes  Requested medication (s) are on the active medication list -yes  Future visit scheduled -no  Last refill: 12/23/19  Notes to clinic: Request for RF- current on appointment- has only seen Stallings in office  Requested Prescriptions  Pending Prescriptions Disp Refills   fluticasone (FLONASE) 50 MCG/ACT nasal spray [Pharmacy Med Name: FLUTICASONE PROP 50 MCG SPRAY] 16 mL 0    Sig: PLACE 2 SPRAYS INTO BOTH NOSTRILS DAILY AS NEEDED.      Ear, Nose, and Throat: Nasal Preparations - Corticosteroids Passed - 01/16/2020  1:30 PM      Passed - Valid encounter within last 12 months    Recent Outpatient Visits           1 week ago Hyperlipidemia, unspecified hyperlipidemia type   Primary Care at [facility], Arlie Solomons, MD   1 year ago Eustachian tube disorder, bilateral   Primary Care at [facility], [place] A, MD   1 year ago Hyperlipidemia, unspecified hyperlipidemia type   Primary Care at [facility]   1 year ago Pressure in head   Primary Care at [facility], Arlie Solomons, MD   2 years ago Acute URI   Primary Care at [place], MD                  Requested Prescriptions  Pending Prescriptions Disp Refills   fluticasone (FLONASE) 50 MCG/ACT nasal spray [Pharmacy Med Name: FLUTICASONE PROP 50 MCG SPRAY] 16 mL 0    Sig: PLACE 2 SPRAYS INTO BOTH NOSTRILS DAILY AS NEEDED.      Ear, Nose, and Throat: Nasal Preparations - Corticosteroids Passed - 01/16/2020  1:30 PM      Passed - Valid encounter within last 12 months    Recent Outpatient Visits           1 week ago Hyperlipidemia, unspecified hyperlipidemia type   Primary Care at [facility], Arlie Solomons, MD   1 year ago Eustachian tube disorder, bilateral   Primary Care at [facility], [place] A, MD   1 year ago Hyperlipidemia, unspecified hyperlipidemia type   Primary Care at [facility]   1 year ago Pressure in head   Primary Care at [facility], Arlie Solomons, MD    2 years ago Acute URI   Primary Care at [facility], Arlie Solomons, MD

## 2020-01-30 ENCOUNTER — Telehealth: Payer: Self-pay | Admitting: Family Medicine

## 2020-01-30 MED ORDER — METFORMIN HCL ER 500 MG PO TB24: ORAL_TABLET | ORAL | 0 refills | Status: DC

## 2020-01-30 NOTE — Telephone Encounter (Signed)
Pt usually gets a 90 day supply of metFORMIN (GLUCOPHAGE-XR) 500 MG 24 hr  But during her last visit she only received 30 and will run out in a couple days/ pt asked for a refill/ please advise

## 2020-01-30 NOTE — Telephone Encounter (Signed)
I have sent a patient rx for 90 day.   Please schedule pt for a TOC if she is to follow up with [place].

## 2020-02-02 ENCOUNTER — Encounter: Payer: Self-pay | Admitting: Emergency Medicine

## 2020-02-03 NOTE — Telephone Encounter (Signed)
Tried to call pt to sch appt but it was a wrong number

## 2020-02-25 ENCOUNTER — Ambulatory Visit
Admission: RE | Admit: 2020-02-25 | Discharge: 2020-02-25 | Disposition: A | Discharge status: Home / Self Care | Payer: BC Managed Care – PPO | Source: Ambulatory Visit | Attending: Adult Health | Admitting: Adult Health

## 2020-02-25 ENCOUNTER — Ambulatory Visit
Admission: RE | Admit: 2020-02-25 | Discharge: 2020-02-25 | Disposition: A | Discharge status: Home / Self Care | Payer: BC Managed Care – PPO | Source: Ambulatory Visit | Attending: Oncology | Admitting: Oncology

## 2020-02-25 ENCOUNTER — Other Ambulatory Visit: Payer: Self-pay

## 2020-02-25 DIAGNOSIS — M85852 Other specified disorders of bone density and structure, left thigh: Secondary | ICD-10-CM | POA: Diagnosis not present

## 2020-02-25 DIAGNOSIS — C50811 Malignant neoplasm of overlapping sites of right female breast: Secondary | ICD-10-CM

## 2020-02-25 DIAGNOSIS — E2839 Other primary ovarian failure: Secondary | ICD-10-CM

## 2020-02-25 DIAGNOSIS — Z1231 Encounter for screening mammogram for malignant neoplasm of breast: Secondary | ICD-10-CM | POA: Diagnosis not present

## 2020-02-25 DIAGNOSIS — Z780 Asymptomatic menopausal state: Secondary | ICD-10-CM | POA: Diagnosis not present

## 2020-02-25 DIAGNOSIS — Z170 Estrogen receptor positive status [ER+]: Secondary | ICD-10-CM

## 2020-02-26 ENCOUNTER — Telehealth: Payer: Self-pay

## 2020-02-26 NOTE — Telephone Encounter (Signed)
-----   Message from Gardenia Phlegm, NP sent at 02/25/2020  9:17 PM EDT ----- Mild osteopenia.  Recommend calcium vitamin d and weight bearing exercises. ----- Message ----- From: Buel Ream, Rad Results In Sent: 02/25/2020  10:09 AM EDT To: Chauncey Cruel, MD

## 2020-02-26 NOTE — Telephone Encounter (Signed)
Called pt to inform of results per Wilber Bihari, NP. Pt did not answer. LVM to return call to [phone] and ask for Lindsey's nurse.

## 2020-02-26 NOTE — Telephone Encounter (Signed)
Pt returned call to this LPN. Results and recommendation given to pt per Wilber Bihari, NP. Pt verbalized thanks and agreement and will see Mendel Ryder in 2 weeks per schedule.

## 2020-03-11 ENCOUNTER — Inpatient Hospital Stay: Payer: BC Managed Care – PPO | Attending: Adult Health | Admitting: Adult Health

## 2020-03-11 ENCOUNTER — Encounter: Payer: Self-pay | Admitting: Adult Health

## 2020-03-11 ENCOUNTER — Other Ambulatory Visit: Payer: Self-pay

## 2020-03-11 VITALS — BP 125/56 | HR 95 | Temp 98.0°F | Resp 18 | Ht 65.0 in | Wt 223.9 lb

## 2020-03-11 DIAGNOSIS — Z9079 Acquired absence of other genital organ(s): Secondary | ICD-10-CM | POA: Diagnosis not present

## 2020-03-11 DIAGNOSIS — Z90722 Acquired absence of ovaries, bilateral: Secondary | ICD-10-CM | POA: Diagnosis not present

## 2020-03-11 DIAGNOSIS — Z7984 Long term (current) use of oral hypoglycemic drugs: Secondary | ICD-10-CM | POA: Insufficient documentation

## 2020-03-11 DIAGNOSIS — Z79899 Other long term (current) drug therapy: Secondary | ICD-10-CM | POA: Diagnosis not present

## 2020-03-11 DIAGNOSIS — Z170 Estrogen receptor positive status [ER+]: Secondary | ICD-10-CM | POA: Diagnosis not present

## 2020-03-11 DIAGNOSIS — Z90710 Acquired absence of both cervix and uterus: Secondary | ICD-10-CM | POA: Insufficient documentation

## 2020-03-11 DIAGNOSIS — Z8249 Family history of ischemic heart disease and other diseases of the circulatory system: Secondary | ICD-10-CM | POA: Insufficient documentation

## 2020-03-11 DIAGNOSIS — I10 Essential (primary) hypertension: Secondary | ICD-10-CM | POA: Insufficient documentation

## 2020-03-11 DIAGNOSIS — C50811 Malignant neoplasm of overlapping sites of right female breast: Secondary | ICD-10-CM | POA: Diagnosis not present

## 2020-03-11 DIAGNOSIS — Z803 Family history of malignant neoplasm of breast: Secondary | ICD-10-CM | POA: Insufficient documentation

## 2020-03-11 DIAGNOSIS — Z853 Personal history of malignant neoplasm of breast: Secondary | ICD-10-CM | POA: Insufficient documentation

## 2020-03-11 DIAGNOSIS — Z9221 Personal history of antineoplastic chemotherapy: Secondary | ICD-10-CM | POA: Diagnosis not present

## 2020-03-11 DIAGNOSIS — Z923 Personal history of irradiation: Secondary | ICD-10-CM | POA: Insufficient documentation

## 2020-03-11 DIAGNOSIS — Z8041 Family history of malignant neoplasm of ovary: Secondary | ICD-10-CM | POA: Diagnosis not present

## 2020-03-11 NOTE — Progress Notes (Signed)
CLINIC:  Survivorship   REASON FOR VISIT:  Routine follow-up for history of breast cancer.   BRIEF ONCOLOGIC HISTORY:  Per Dr. Jana Hakim note in 10/2018:   74 y.o. [place] woman status post right lumpectomy and sentinel lymph node sampling 04/08/2008 for a pT1c pN1, stage IIA invasive ductal carcinoma of overlapping sites, grade 2, estrogen receptor 99% and progesterone receptor 51% positive, with an MIB-1 of 10%, and HER-2 amplification by CISH, with a ratio of 2.45.  (1) treated adjuvantly with carboplatin and Taxotere x2 cycles, poorly tolerated, followed by 4 cycles of weekly carboplatin Abraxane completed 07/21/2008             (a) received trastuzumab starting with chemotherapy, completing a year (see Dr Julien Girt note 12/11/2011)  (2) adjuvant radiation therapy to the right breast completed 10/30/2008  (3) started on letrozole March of 2010, switched to anastrozole February of 2011 because of arthralgias/myalgias, anastrozole discontinued October 2016 when the patient was released from oncologic follow-up  (4) genetic testing sent 05/28/2014-- the following genes showed no abnormality: ATM, BARD1, BRCA1, BRCA2, BRIP1, CDH1, CHEK2, EPCAM, MLH1, MRE11A, MSH2, MSH6, MUTYH, NBN, NF1, PALB2, PMS2, PTEN, RAD50, RAD51C, RAD51D, SMARCA4, STK11, and TP53.  (5) the patient is status post TAH/BSO remotely   INTERVAL HISTORY:  Ms. Gosse presents to the [facility] today for routine follow-up for her history of breast cancer.  She reviewed with me her cancer story, and her family history, which includes her mom who had ovarian cancer, and her uncle who had female breast cancer.  She wonders if she meets criteria for repeat genetic testing, or if any new updates are available for her.    Arietta is up to date with seeing her PCP and with her cancer screenings.  She remains active.  She underwent a bilateral breast screening mammogram with TOMO and CAD on 02/25/2020 that showed no  evidence of malignancy and breast density category C.  She also underwent bone density testing at that time which demonstrated mild osteopenia with a T score of -1.7 in the left femur.  She is taking calcium and vitamin d.      REVIEW OF SYSTEMS:  Review of Systems  Constitutional: Negative for appetite change, chills, fatigue, fever and unexpected weight change.  HENT:   Negative for hearing loss and lump/mass.   Eyes: Negative for eye problems and icterus.  Respiratory: Negative for chest tightness, cough and shortness of breath.   Cardiovascular: Negative for chest pain, leg swelling and palpitations.  Gastrointestinal: Negative for abdominal distention, abdominal pain, constipation, diarrhea, nausea and vomiting.  Endocrine: Negative for hot flashes.  Musculoskeletal: Negative for arthralgias.  Skin: Negative for itching and rash.  Neurological: Negative for dizziness, extremity weakness, headaches and numbness.  Hematological: Negative for adenopathy. Does not bruise/bleed easily.  Psychiatric/Behavioral: Negative for depression. The patient is not nervous/anxious.   Breast: Denies any new nodularity, masses, tenderness, nipple changes, or nipple discharge.       PAST MEDICAL/SURGICAL HISTORY:  Past Medical History:  Diagnosis Date  . Allergy   . Cancer ([place])   . Family history of breast cancer in female   . Family history of ovarian cancer   . History of breast cancer   . Hypertension   . Seizures ([place])    Past Surgical History:  Procedure Laterality Date  . ABDOMINAL HYSTERECTOMY    . BREAST LUMPECTOMY Right 2009  . BREAST SURGERY    . JOINT REPLACEMENT    . SPINE  SURGERY     laminectomy     ALLERGIES:  Allergies  Allergen Reactions  . Iohexol      Desc: pt states in '80's wheezing; dyspnea w/ contrast--pt needs pre meds in future; slg 04/24/2008   . Sulfa Antibiotics      CURRENT MEDICATIONS:  Outpatient Encounter Medications as of 03/11/2020  Medication  Sig  . albuterol (PROAIR HFA) 108 (90 Base) MCG/ACT inhaler Inhale 2 puffs into the lungs every 6 (six) hours as needed.  Marland Kitchen amoxicillin (AMOXIL) 500 MG tablet TAKE 4 TABLETS BY MOUTH FOR ONE DOSE 1 HR PRIOR TO DENTAL SURGERY  . amoxicillin-clavulanate (AUGMENTIN) 875-125 MG tablet Take 1 tablet by mouth 2 (two) times daily.  Marland Kitchen atorvastatin (LIPITOR) 10 MG tablet TAKE 1 TABLET DAILY AT 6PM  . calcium citrate-vitamin D (CITRACAL+D) 315-200 MG-UNIT per tablet Take 1 tablet by mouth 2 (two) times daily.  . cetirizine (ZYRTEC) 10 MG tablet Take 10 mg by mouth daily as needed.   . fluticasone (FLONASE) 50 MCG/ACT nasal spray PLACE 2 SPRAYS INTO BOTH NOSTRILS DAILY AS NEEDED.  . metFORMIN (GLUCOPHAGE-XR) 500 MG 24 hr tablet TAKE 1 TABLET DAILY WITH   BREAKFAST  . Multiple Vitamin (MULTIVITAMIN) capsule Take 1 capsule by mouth daily.  Marland Kitchen olmesartan-hydrochlorothiazide (BENICAR HCT) 20-12.5 MG tablet Take 1 tablet by mouth daily.  . phenytoin (DILANTIN) 100 MG ER capsule Take 3 capsules (300 mg total) by mouth at bedtime.   No facility-administered encounter medications on file as of 03/11/2020.     ONCOLOGIC FAMILY HISTORY:  Family History  Problem Relation Age of Onset  . Ovarian cancer Mother 64       also uterine sarcoma  . Breast cancer Daughter 46  . Other Father   . Breast cancer Maternal Uncle        female breast cancer; deceased 64  . Breast cancer Cousin 28       Currently 34  . Cancer Maternal Grandmother   . Heart disease Maternal Grandfather   . Hypertension Maternal Grandfather   . Hypertension Paternal Grandfather     GENETIC COUNSELING/TESTING: Se above.  Repeat referral sent  SOCIAL HISTORY:  Social History   Socioeconomic History  . Marital status: Married    Spouse name: Not on file  . Number of children: 2  . Years of education: Not on file  . Highest education level: Not on file  Occupational History  . Not on file  Tobacco Use  . Smoking status: Never Smoker   . Smokeless tobacco: Never Used  Vaping Use  . Vaping Use: Never used  Substance and Sexual Activity  . Alcohol use: No  . Drug use: No  . Sexual activity: Not on file  Other Topics Concern  . Not on file  Social History Narrative  . Not on file   Social Determinants of Health   Financial Resource Strain:   . Difficulty of Paying Living Expenses:   Food Insecurity:   . Worried About Charity fundraiser in the Last Year:   . Arboriculturist in the Last Year:   Transportation Needs:   . Film/video editor (Medical):   Marland Kitchen Lack of Transportation (Non-Medical):   Physical Activity:   . Days of Exercise per Week:   . Minutes of Exercise per Session:   Stress:   . Feeling of Stress :   Social Connections:   . Frequency of Communication with Friends and Family:   . Frequency  of Social Gatherings with Friends and Family:   . Attends Religious Services:   . Active Member of Clubs or Organizations:   . Attends Archivist Meetings:   Marland Kitchen Marital Status:   Intimate Partner Violence:   . Fear of Current or Ex-Partner:   . Emotionally Abused:   Marland Kitchen Physically Abused:   . Sexually Abused:       PHYSICAL EXAMINATION:  Vital Signs: Vitals:   03/11/20 1547  BP: (!) 125/56  Pulse: 95  Resp: 18  Temp: 98 F (36.7 C)  SpO2: 98%   Filed Weights   03/11/20 1547  Weight: 223 lb 14.4 oz (101.6 kg)   General: Well-nourished, well-appearing female in no acute distress.  Unaccompanied today.   HEENT: Head is normocephalic.  Pupils equal and reactive to light. Conjunctivae clear without exudate.  Sclerae anicteric. Oral mucosa is pink, moist.  Oropharynx is pink without lesions or erythema.  Lymph: No cervical, supraclavicular, or infraclavicular lymphadenopathy noted on palpation.  Cardiovascular: Regular rate and rhythm.Marland Kitchen Respiratory: Clear to auscultation bilaterally. Chest expansion symmetric; breathing non-labored.  Breast Exam:  -Left breast: No appreciable masses  on palpation. No skin redness, thickening, or peau d'orange appearance; no nipple retraction or nipple discharge; -Right breast: No appreciable masses on palpation. No skin redness, thickening, or peau d'orange appearance; no nipple retraction or nipple discharge; mild distortion in symmetry at previous lumpectomy site well healed scar without erythema or nodularity. -Axilla: No axillary adenopathy bilaterally.  GI: Abdomen soft and round; non-tender, non-distended. Bowel sounds normoactive. No hepatosplenomegaly.   GU: Deferred.  Neuro: No focal deficits. Steady gait.  Psych: Mood and affect normal and appropriate for situation.  MSK: No focal spinal tenderness to palpation, full range of motion in bilateral upper extremities Extremities: No edema. Skin: Warm and dry.  LABORATORY DATA:  None for this visit   DIAGNOSTIC IMAGING:  Most recent mammogram:     ASSESSMENT AND PLAN:  Ms.. Quesenberry is a pleasant 74 y.o. female with history of Stage IIA right breast invasive ductal carcinoma, ER+/PR+/HER2+, diagnosed in 03/2008, treated with lumpectomy, adjuvant chemotherapy, manintenance Trastuzumab, adjuvant radiation therapy, and anti-estrogen therapy with Anastrozole x 5 years completed in 05/2015.  [facility] for surveillance and routine follow-up.   1. History of breast cancer:  Ms. Leverett is currently clinically and radiographically without evidence of disease or recurrence of breast cancer. She will be due for mammogram in 01/2021.  I let her know that I will reach out to genetics to see if she meets criteria for retesting.  We will see her back in one year for continued long term surveillance.  I encouraged her to call me with any questions or concerns before her next visit at the cancer center, and I would be happy to see her sooner, if needed.    2. Bone health:  Given Ms. Mcnamara's age, history of breast cancer, and her previous anti-estrogen therapy with  Anastrozole, she is at risk for bone demineralization. Her bone density testing shows mild osteopenia.  I reviewed with her things that improve bone health, such as calcium, vitamin d, and weight bearing exercises.  3. Cancer screening:  Due to Ms. Endo's history and her age, she should receive screening for skin cancers, colon cancer, and gynecologic cancers. She was encouraged to follow-up with her PCP for appropriate cancer screenings.   4. Health maintenance and wellness promotion: Ms. Genson was encouraged to consume 5-7 servings of fruits and vegetables  per day. She was also encouraged to engage in moderate to vigorous exercise for 30 minutes per day most days of the week. She was instructed to limit her alcohol consumption and continue to abstain from tobacco use.      Dispo:  -Return to cancer center in one year for LTS follow up -Mammogram in 01/2021 -Bone density in 01/2022   Total encounter time: 30 minutes*   Gardenia Phlegm, NP [place] [phone]  *Total Encounter Time as defined by the Centers for Medicare and Medicaid Services includes, in addition to the face-to-face time of a patient visit (documented in the note above) non-face-to-face time: obtaining and reviewing outside history, ordering and reviewing medications, tests or procedures, care coordination (communications with other health care professionals or caregivers) and documentation in the medical record.    Note: PRIMARY CARE PROVIDER Forrest Moron, [place] [phone]

## 2020-03-12 ENCOUNTER — Telehealth: Payer: Self-pay | Admitting: Adult Health

## 2020-03-12 NOTE — Telephone Encounter (Signed)
No 7/15 los. No changes made to pt's schedule.

## 2020-03-15 ENCOUNTER — Telehealth: Payer: Self-pay | Admitting: Adult Health

## 2020-03-15 NOTE — Telephone Encounter (Signed)
Scheduled appt per 7/17 sch msg - pt is aware of appts added.

## 2020-03-16 ENCOUNTER — Other Ambulatory Visit: Payer: Self-pay

## 2020-03-16 DIAGNOSIS — C50811 Malignant neoplasm of overlapping sites of right female breast: Secondary | ICD-10-CM

## 2020-03-16 NOTE — Progress Notes (Signed)
Referral to genetics put in per Wilber Bihari, NP. Message sent to scheduling to call pt and set up genetics appt.

## 2020-03-30 DIAGNOSIS — Z03818 Encounter for observation for suspected exposure to other biological agents ruled out: Secondary | ICD-10-CM | POA: Diagnosis not present

## 2020-03-30 DIAGNOSIS — Z20822 Contact with and (suspected) exposure to covid-19: Secondary | ICD-10-CM | POA: Diagnosis not present

## 2020-05-11 ENCOUNTER — Telehealth: Payer: Self-pay | Admitting: Family Medicine

## 2020-05-11 NOTE — Telephone Encounter (Addendum)
Error/ pt did not know she had refills

## 2020-05-13 ENCOUNTER — Other Ambulatory Visit: Payer: Self-pay | Admitting: Family Medicine

## 2020-05-21 NOTE — Telephone Encounter (Signed)
Spouse checking on the status of metFORMIN (GLUCOPHAGE-XR) 500 MG 24 hr tablet refill request. Stating [facility] never received it and would like a follow up call today      [facility] [place] [facility]  [place], [place] [address]  Phone:  [phone] Fax:  [phone]

## 2020-05-21 NOTE — Addendum Note (Signed)
Addended by: Linus Orn A on: 05/21/2020 03:32 PM   Modules accepted: Orders

## 2020-06-21 ENCOUNTER — Encounter: Payer: BC Managed Care – PPO | Admitting: Genetic Counselor

## 2020-06-21 ENCOUNTER — Other Ambulatory Visit: Payer: BC Managed Care – PPO

## 2020-07-14 DIAGNOSIS — Z9189 Other specified personal risk factors, not elsewhere classified: Secondary | ICD-10-CM | POA: Diagnosis not present

## 2020-07-14 DIAGNOSIS — Z20822 Contact with and (suspected) exposure to covid-19: Secondary | ICD-10-CM | POA: Diagnosis not present

## 2020-07-14 DIAGNOSIS — R0981 Nasal congestion: Secondary | ICD-10-CM | POA: Diagnosis not present

## 2020-08-09 ENCOUNTER — Other Ambulatory Visit: Payer: Self-pay | Admitting: Family Medicine

## 2020-08-09 NOTE — Telephone Encounter (Signed)
Requested medication (s) are due for refill today: yes  Requested medication (s) are on the active medication list:yes  Last refill:  05/13/20 #090  0 refills  Future visit scheduled:No Please schedule appointment  Notes to clinic:  Patient needs appointment. Office closed for lunch. Please contact for appointment.  Dr Carlota Raspberry refilled 05/13/20.    Requested Prescriptions  Pending Prescriptions Disp Refills   metFORMIN (GLUCOPHAGE-XR) 500 MG 24 hr tablet 90 tablet 0    Sig: Take 1 tablet (500 mg total) by mouth daily with breakfast.      Endocrinology:  Diabetes - Biguanides Failed - 08/09/2020 12:39 PM      Failed - HBA1C is between 0 and 7.9 and within 180 days    Hemoglobin A1C  Date Value Ref Range Status  11/14/2018 5.6 4.0 - 5.6 % Final          Failed - Valid encounter within last 6 months    Recent Outpatient Visits           7 months ago Hyperlipidemia, unspecified hyperlipidemia type   Primary Care at [facility], Arlie Solomons, MD   1 year ago Eustachian tube disorder, bilateral   Primary Care at [facility], Zoe A, MD   1 year ago Hyperlipidemia, unspecified hyperlipidemia type   Primary Care at [facility]   2 years ago Pressure in head   Primary Care at [facility], [place] A, MD   2 years ago Acute URI   Primary Care at [facility], [place], MD                Citrus in normal range and within 360 days    Creatinine  Date Value Ref Range Status  08/30/2016 0.8 0.6 - 1.1 mg/dL Final   Creatinine, Ser  Date Value Ref Range Status  01/07/2020 0.87 0.57 - 1.00 mg/dL Final          Passed - AA eGFR in normal range and within 360 days    GFR calc Af Amer  Date Value Ref Range Status  01/07/2020 78 >59 mL/min/1.73 Final    Comment:    **[facility] currently reports eGFR in compliance with the current**   recommendations of the [employer]. [facility] will   update reporting as new guidelines are published from the NKF-ASN   Task  force.    GFR calc non Af Amer  Date Value Ref Range Status  01/07/2020 67 >59 mL/min/1.73 Final   EGFR  Date Value Ref Range Status  08/30/2016 83 (L) >90 ml/min/1.73 m2 Final    Comment:    eGFR is calculated using the CKD-EPI Creatinine Equation (2009)

## 2020-08-09 NOTE — Telephone Encounter (Signed)
Medication Refill - Medication: metFORMIN (GLUCOPHAGE-XR) 500 MG 24 hr tablet   Has the patient contacted their pharmacy? Yes.   (Agent: If no, request that the patient contact the pharmacy for the refill.) (Agent: If yes, when and what did the pharmacy advise?)  Preferred Pharmacy (with phone number or street name):  [facility] [place] to [facility] [place] [address]  Phone: [phone] Fax: [phone]     Agent: Please be advised that RX refills may take up to 3 business days. We ask that you follow-up with your pharmacy.

## 2020-08-10 ENCOUNTER — Other Ambulatory Visit: Payer: Self-pay

## 2020-08-10 ENCOUNTER — Encounter: Payer: Self-pay | Admitting: Emergency Medicine

## 2020-08-10 ENCOUNTER — Ambulatory Visit: Payer: BC Managed Care – PPO | Admitting: Emergency Medicine

## 2020-08-10 VITALS — BP 124/74 | HR 90 | Temp 97.3°F | Resp 16 | Ht 65.0 in | Wt 221.0 lb

## 2020-08-10 DIAGNOSIS — I10 Essential (primary) hypertension: Secondary | ICD-10-CM

## 2020-08-10 DIAGNOSIS — G40909 Epilepsy, unspecified, not intractable, without status epilepticus: Secondary | ICD-10-CM | POA: Insufficient documentation

## 2020-08-10 DIAGNOSIS — C50811 Malignant neoplasm of overlapping sites of right female breast: Secondary | ICD-10-CM

## 2020-08-10 DIAGNOSIS — Z6836 Body mass index (BMI) 36.0-36.9, adult: Secondary | ICD-10-CM

## 2020-08-10 DIAGNOSIS — Z170 Estrogen receptor positive status [ER+]: Secondary | ICD-10-CM

## 2020-08-10 DIAGNOSIS — E785 Hyperlipidemia, unspecified: Secondary | ICD-10-CM

## 2020-08-10 MED ORDER — OLMESARTAN MEDOXOMIL-HCTZ 20-12.5 MG PO TABS: 1.0000 | ORAL_TABLET | Freq: Every day | ORAL | 3 refills | Status: DC

## 2020-08-10 MED ORDER — PHENYTOIN SODIUM EXTENDED 100 MG PO CAPS: 200.0000 mg | ORAL_CAPSULE | Freq: Every day | ORAL | 3 refills | Status: DC

## 2020-08-10 NOTE — Progress Notes (Signed)
Shelly Simpson 74 y.o.   Chief Complaint  Patient presents with  . Medication Refill    HISTORY OF PRESENT ILLNESS: This is a 74 y.o. female here for medication refill and to transfer care. Used to see Dr. Nolon Rod. Has history of hypertension and dyslipidemia. Also has a history of breast cancer. History of one single questionable seizure episode in 1972.  Normal work-up.  However has been on Dilantin ever since. On Metformin for unknown reasons, 500 mg daily.  HPI   Prior to Admission medications   Medication Sig Start Date End Date Taking? Authorizing Provider  albuterol (PROAIR HFA) 108 (90 Base) MCG/ACT inhaler Inhale 2 puffs into the lungs every 6 (six) hours as needed. 01/07/20   Forrest Moron, MD  amoxicillin (AMOXIL) 500 MG tablet TAKE 4 TABLETS BY MOUTH FOR ONE DOSE 1 HR PRIOR TO DENTAL SURGERY 02/21/19   Delia Chimes A, MD  atorvastatin (LIPITOR) 10 MG tablet TAKE 1 TABLET DAILY AT 6PM 01/07/20   Forrest Moron, MD  calcium citrate-vitamin D (CITRACAL+D) 315-200 MG-UNIT per tablet Take 1 tablet by mouth 2 (two) times daily.    [provider]  cetirizine (ZYRTEC) 10 MG tablet Take 10 mg by mouth daily as needed.     [provider]  fluticasone (FLONASE) 50 MCG/ACT nasal spray PLACE 2 SPRAYS INTO BOTH NOSTRILS DAILY AS NEEDED. 01/16/20   Forrest Moron, MD  metFORMIN (GLUCOPHAGE-XR) 500 MG 24 hr tablet TAKE 1 TABLET DAILY WITH   BREAKFAST 05/13/20   Wendie Agreste, MD  Multiple Vitamin (MULTIVITAMIN) capsule Take 1 capsule by mouth daily.    [provider]  olmesartan-hydrochlorothiazide (BENICAR HCT) 20-12.5 MG tablet Take 1 tablet by mouth daily. 01/07/20   Forrest Moron, MD  phenytoin (DILANTIN) 100 MG ER capsule Take 3 capsules (300 mg total) by mouth at bedtime. 01/07/20   Forrest Moron, MD    Allergies  Allergen Reactions  . Iohexol      Desc: pt states in '80's wheezing; dyspnea w/ contrast--pt needs pre meds in future;  slg 04/24/2008   . Sulfa Antibiotics     Patient Active Problem List   Diagnosis Date Noted  . Essential hypertension 08/10/2020  . Dyslipidemia 08/10/2020  . Seizure disorder ([place]) 08/10/2020  . Pain in left shoulder 08/20/2019  . Unilateral primary osteoarthritis, left knee 06/12/2017  . Malignant neoplasm of overlapping sites of right breast in female, estrogen receptor positive ([place]) 06/25/2014  . History of breast cancer   . Family history of ovarian cancer   . Family history of breast cancer in female     Past Medical History:  Diagnosis Date  . Allergy   . Cancer ([place])   . Family history of breast cancer in female   . Family history of ovarian cancer   . History of breast cancer   . Hypertension   . Seizures ([place])     Past Surgical History:  Procedure Laterality Date  . ABDOMINAL HYSTERECTOMY    . BREAST LUMPECTOMY Right 2009  . BREAST SURGERY    . JOINT REPLACEMENT    . SPINE SURGERY     laminectomy    Social History   Socioeconomic History  . Marital status: Married    Spouse name: Not on file  . Number of children: 2  . Years of education: Not on file  . Highest education level: Not on file  Occupational History  . Not on file  Tobacco  Use  . Smoking status: Never Smoker  . Smokeless tobacco: Never Used  Vaping Use  . Vaping Use: Never used  Substance and Sexual Activity  . Alcohol use: No  . Drug use: No  . Sexual activity: Not on file  Other Topics Concern  . Not on file  Social History Narrative  . Not on file   Social Determinants of Health   Financial Resource Strain: Not on file  Food Insecurity: Not on file  Transportation Needs: Not on file  Physical Activity: Not on file  Stress: Not on file  Social Connections: Not on file  Intimate Partner Violence: Not on file    Family History  Problem Relation Age of Onset  . Ovarian cancer Mother 29       also uterine sarcoma  . Breast cancer Daughter 78  . Other Father   . Breast  cancer Maternal Uncle        female breast cancer; deceased 62  . Breast cancer Cousin 21       Currently 1  . Cancer Maternal Grandmother   . Heart disease Maternal Grandfather   . Hypertension Maternal Grandfather   . Hypertension Paternal Grandfather      Review of Systems  Constitutional: Negative.  Negative for chills and fever.  HENT: Negative.  Negative for congestion and sore throat.   Respiratory: Negative.  Negative for cough and shortness of breath.   Cardiovascular: Negative.  Negative for chest pain and palpitations.  Gastrointestinal: Negative.  Negative for abdominal pain, diarrhea, nausea and vomiting.  Genitourinary: Negative.  Negative for dysuria and hematuria.  Musculoskeletal: Negative.   Skin: Negative.  Negative for rash.  Neurological: Negative.  Negative for dizziness and headaches.  All other systems reviewed and are negative.  Today's Vitals   08/10/20 1336  BP: 124/74  Pulse: 90  Resp: 16  Temp: (!) 97.3 F (36.3 C)  TempSrc: Temporal  SpO2: 98%  Weight: 221 lb (100.2 kg)  Height: 5\' 5"  (1.651 m)   Body mass index is 36.78 kg/m. Wt Readings from Last 3 Encounters:  08/10/20 221 lb (100.2 kg)  03/11/20 223 lb 14.4 oz (101.6 kg)  01/07/20 219 lb 9.6 oz (99.6 kg)     Physical Exam Vitals reviewed.  Constitutional:      Appearance: Normal appearance.  HENT:     Head: Normocephalic.  Eyes:     Extraocular Movements: Extraocular movements intact.     Conjunctiva/sclera: Conjunctivae normal.     Pupils: Pupils are equal, round, and reactive to light.  Cardiovascular:     Rate and Rhythm: Normal rate and regular rhythm.     Pulses: Normal pulses.     Heart sounds: Normal heart sounds.  Pulmonary:     Effort: Pulmonary effort is normal.     Breath sounds: Normal breath sounds.  Musculoskeletal:        General: Normal range of motion.     Cervical back: Normal range of motion and neck supple.  Skin:    General: Skin is warm and dry.      Capillary Refill: Capillary refill takes less than 2 seconds.  Neurological:     General: No focal deficit present.     Mental Status: She is alert and oriented to person, place, and time.  Psychiatric:        Mood and Affect: Mood normal.        Behavior: Behavior normal.      ASSESSMENT & PLAN:  Clinically stable.  No clear indication for Metformin therapy. Questionable seizure history with normal work-up in years past.  Episode took place 1972 only witnessed by husband who was not sure at the time if it was a seizure or not.  Patient was started on antiseizure medication, Dilantin, in the ER and was never stopped.  Never had a seizure again after that initial questionable episode.  Tamya was seen today for medication refill.  Diagnoses and all orders for this visit:  Essential hypertension -     olmesartan-hydrochlorothiazide (BENICAR HCT) 20-12.5 MG tablet; Take 1 tablet by mouth daily. -     Comprehensive metabolic panel -     Hemoglobin A1c  Dyslipidemia -     Hemoglobin A1c  Seizure disorder (HCC) -     phenytoin (DILANTIN) 100 MG ER capsule; Take 2 capsules (200 mg total) by mouth at bedtime. -     Phenytoin level, free and total -     Ambulatory referral to Neurology  Malignant neoplasm of overlapping sites of right breast in female, estrogen receptor positive ([place])  Body mass index (BMI) of 36.0-36.9 in adult    Patient Instructions   Stop Metformin. Decrease dose of Dilantin to 200 mg at bedtime.  Follow-up with neurology. Continue all other medications. Follow-up in 3 months.  Health Maintenance After Age 65 After age 49, you are at a higher risk for certain long-term diseases and infections as well as injuries from falls. Falls are a major cause of broken bones and head injuries in people who are older than age 22. Getting regular preventive care can help to keep you healthy and well. Preventive care includes getting regular testing and making lifestyle  changes as recommended by your health care provider. Talk with your health care provider about:  Which screenings and tests you should have. A screening is a test that checks for a disease when you have no symptoms.  A diet and exercise plan that is right for you. What should I know about screenings and tests to prevent falls? Screening and testing are the best ways to find a health problem early. Early diagnosis and treatment give you the best chance of managing medical conditions that are common after age 61. Certain conditions and lifestyle choices may make you more likely to have a fall. Your health care provider may recommend:  Regular vision checks. Poor vision and conditions such as cataracts can make you more likely to have a fall. If you wear glasses, make sure to get your prescription updated if your vision changes.  Medicine review. Work with your health care provider to regularly review all of the medicines you are taking, including over-the-counter medicines. Ask your health care provider about any side effects that may make you more likely to have a fall. Tell your health care provider if any medicines that you take make you feel dizzy or sleepy.  Osteoporosis screening. Osteoporosis is a condition that causes the bones to get weaker. This can make the bones weak and cause them to break more easily.  Blood pressure screening. Blood pressure changes and medicines to control blood pressure can make you feel dizzy.  Strength and balance checks. Your health care provider may recommend certain tests to check your strength and balance while standing, walking, or changing positions.  Foot health exam. Foot pain and numbness, as well as not wearing proper footwear, can make you more likely to have a fall.  Depression screening. You may be more likely  to have a fall if you have a fear of falling, feel emotionally low, or feel unable to do activities that you used to do.  Alcohol use  screening. Using too much alcohol can affect your balance and may make you more likely to have a fall. What actions can I take to lower my risk of falls? General instructions  Talk with your health care provider about your risks for falling. Tell your health care provider if: ? You fall. Be sure to tell your health care provider about all falls, even ones that seem minor. ? You feel dizzy, sleepy, or off-balance.  Take over-the-counter and prescription medicines only as told by your health care provider. These include any supplements.  Eat a healthy diet and maintain a healthy weight. A healthy diet includes low-fat dairy products, low-fat (lean) meats, and fiber from whole grains, beans, and lots of fruits and vegetables. Home safety  Remove any tripping hazards, such as rugs, cords, and clutter.  Install safety equipment such as grab bars in bathrooms and safety rails on stairs.  Keep rooms and walkways well-lit. Activity   Follow a regular exercise program to stay fit. This will help you maintain your balance. Ask your health care provider what types of exercise are appropriate for you.  If you need a cane or walker, use it as recommended by your health care provider.  Wear supportive shoes that have nonskid soles. Lifestyle  Do not drink alcohol if your health care provider tells you not to drink.  If you drink alcohol, limit how much you have: ? 0-1 drink a day for women. ? 0-2 drinks a day for men.  Be aware of how much alcohol is in your drink. In the [place] one drink equals one typical bottle of beer (12 oz), one-half glass of wine (5 oz), or one shot of hard liquor (1 oz).  Do not use any products that contain nicotine or tobacco, such as cigarettes and e-cigarettes. If you need help quitting, ask your health care provider. Summary  Having a healthy lifestyle and getting preventive care can help to protect your health and wellness after age 10.  Screening and testing  are the best way to find a health problem early and help you avoid having a fall. Early diagnosis and treatment give you the best chance for managing medical conditions that are more common for people who are older than age 21.  Falls are a major cause of broken bones and head injuries in people who are older than age 42. Take precautions to prevent a fall at home.  Work with your health care provider to learn what changes you can make to improve your health and wellness and to prevent falls. This information is not intended to replace advice given to you by your health care provider. Make sure you discuss any questions you have with your health care provider. Document Revised: 12/05/2018 Document Reviewed: 06/27/2017 Elsevier Patient Education  [employer].     If you have lab work done today you will be contacted with your lab results within the next 2 weeks.  If you have not heard from [place] then please contact us. The fastest way to get your results is to register for My Chart.   IF you received an x-ray today, you will receive an invoice from [facility]. Please contact [facility] at [phone] with questions or concerns regarding your invoice.   IF you received labwork today, you will receive an  invoice from [place] [facility]. Please contact [facility] at [phone] with questions or concerns regarding your invoice.   Our billing staff will not be able to assist you with questions regarding bills from these companies.  You will be contacted with the lab results as soon as they are available. The fastest way to get your results is to activate your My Chart account. Instructions are located on the last page of this paperwork. If you have not heard from [place] regarding the results in 2 weeks, please contact this office.         Agustina Caroli, MD [facility]

## 2020-08-10 NOTE — Patient Instructions (Addendum)
Stop Metformin. Decrease dose of Dilantin to 200 mg at bedtime.  Follow-up with neurology. Continue all other medications. Follow-up in 3 months.  Health Maintenance After Age 72 After age 76, you are at a higher risk for certain long-term diseases and infections as well as injuries from falls. Falls are a major cause of broken bones and head injuries in people who are older than age 67. Getting regular preventive care can help to keep you healthy and well. Preventive care includes getting regular testing and making lifestyle changes as recommended by your health care provider. Talk with your health care provider about:  Which screenings and tests you should have. A screening is a test that checks for a disease when you have no symptoms.  A diet and exercise plan that is right for you. What should I know about screenings and tests to prevent falls? Screening and testing are the best ways to find a health problem early. Early diagnosis and treatment give you the best chance of managing medical conditions that are common after age 72. Certain conditions and lifestyle choices may make you more likely to have a fall. Your health care provider may recommend:  Regular vision checks. Poor vision and conditions such as cataracts can make you more likely to have a fall. If you wear glasses, make sure to get your prescription updated if your vision changes.  Medicine review. Work with your health care provider to regularly review all of the medicines you are taking, including over-the-counter medicines. Ask your health care provider about any side effects that may make you more likely to have a fall. Tell your health care provider if any medicines that you take make you feel dizzy or sleepy.  Osteoporosis screening. Osteoporosis is a condition that causes the bones to get weaker. This can make the bones weak and cause them to break more easily.  Blood pressure screening. Blood pressure changes and  medicines to control blood pressure can make you feel dizzy.  Strength and balance checks. Your health care provider may recommend certain tests to check your strength and balance while standing, walking, or changing positions.  Foot health exam. Foot pain and numbness, as well as not wearing proper footwear, can make you more likely to have a fall.  Depression screening. You may be more likely to have a fall if you have a fear of falling, feel emotionally low, or feel unable to do activities that you used to do.  Alcohol use screening. Using too much alcohol can affect your balance and may make you more likely to have a fall. What actions can I take to lower my risk of falls? General instructions  Talk with your health care provider about your risks for falling. Tell your health care provider if: ? You fall. Be sure to tell your health care provider about all falls, even ones that seem minor. ? You feel dizzy, sleepy, or off-balance.  Take over-the-counter and prescription medicines only as told by your health care provider. These include any supplements.  Eat a healthy diet and maintain a healthy weight. A healthy diet includes low-fat dairy products, low-fat (lean) meats, and fiber from whole grains, beans, and lots of fruits and vegetables. Home safety  Remove any tripping hazards, such as rugs, cords, and clutter.  Install safety equipment such as grab bars in bathrooms and safety rails on stairs.  Keep rooms and walkways well-lit. Activity   Follow a regular exercise program to stay fit. This will help  you maintain your balance. Ask your health care provider what types of exercise are appropriate for you.  If you need a cane or walker, use it as recommended by your health care provider.  Wear supportive shoes that have nonskid soles. Lifestyle  Do not drink alcohol if your health care provider tells you not to drink.  If you drink alcohol, limit how much you have: ? 0-1  drink a day for women. ? 0-2 drinks a day for men.  Be aware of how much alcohol is in your drink. In the [place] one drink equals one typical bottle of beer (12 oz), one-half glass of wine (5 oz), or one shot of hard liquor (1 oz).  Do not use any products that contain nicotine or tobacco, such as cigarettes and e-cigarettes. If you need help quitting, ask your health care provider. Summary  Having a healthy lifestyle and getting preventive care can help to protect your health and wellness after age 66.  Screening and testing are the best way to find a health problem early and help you avoid having a fall. Early diagnosis and treatment give you the best chance for managing medical conditions that are more common for people who are older than age 51.  Falls are a major cause of broken bones and head injuries in people who are older than age 11. Take precautions to prevent a fall at home.  Work with your health care provider to learn what changes you can make to improve your health and wellness and to prevent falls. This information is not intended to replace advice given to you by your health care provider. Make sure you discuss any questions you have with your health care provider. Document Revised: 12/05/2018 Document Reviewed: 06/27/2017 Elsevier Patient Education  [employer].     If you have lab work done today you will be contacted with your lab results within the next 2 weeks.  If you have not heard from [place] then please contact us. The fastest way to get your results is to register for My Chart.   IF you received an x-ray today, you will receive an invoice from [facility]. Please contact [facility] at [phone] with questions or concerns regarding your invoice.   IF you received labwork today, you will receive an invoice from [place] [facility]. Please contact [facility] at [phone] with questions or concerns regarding your invoice.   Our billing staff will not  be able to assist you with questions regarding bills from these companies.  You will be contacted with the lab results as soon as they are available. The fastest way to get your results is to activate your My Chart account. Instructions are located on the last page of this paperwork. If you have not heard from [place] regarding the results in 2 weeks, please contact this office.

## 2020-08-11 LAB — PHENYTOIN LEVEL, FREE AND TOTAL
Phenytoin, Free: 0.7 ug/mL — ABNORMAL LOW (ref 1.0–2.0)
Phenytoin, Serum: 14.6 ug/mL (ref 10.0–20.0)

## 2020-08-11 LAB — COMPREHENSIVE METABOLIC PANEL
ALT: 23 IU/L (ref 0–32)
AST: 17 IU/L (ref 0–40)
Albumin/Globulin Ratio: 2.3 — ABNORMAL HIGH (ref 1.2–2.2)
Albumin: 5.1 g/dL — ABNORMAL HIGH (ref 3.7–4.7)
Alkaline Phosphatase: 87 IU/L (ref 44–121)
BUN/Creatinine Ratio: 20 (ref 12–28)
BUN: 18 mg/dL (ref 8–27)
Bilirubin Total: 0.2 mg/dL (ref 0.0–1.2)
CO2: 26 mmol/L (ref 20–29)
Calcium: 10.4 mg/dL — ABNORMAL HIGH (ref 8.7–10.3)
Chloride: 103 mmol/L (ref 96–106)
Creatinine, Ser: 0.88 mg/dL (ref 0.57–1.00)
GFR calc Af Amer: 76 mL/min/{1.73_m2} (ref >59)
GFR calc non Af Amer: 66 mL/min/{1.73_m2} (ref >59)
Globulin, Total: 2.2 g/dL (ref 1.5–4.5)
Glucose: 97 mg/dL (ref 65–99)
Potassium: 4.5 mmol/L (ref 3.5–5.2)
Sodium: 142 mmol/L (ref 134–144)
Total Protein: 7.3 g/dL (ref 6.0–8.5)

## 2020-08-11 LAB — HEMOGLOBIN A1C
Est. average glucose Bld gHb Est-mCnc: 114 mg/dL
Hgb A1c MFr Bld: 5.6 % (ref 4.8–5.6)

## 2020-09-01 ENCOUNTER — Other Ambulatory Visit: Payer: Self-pay | Admitting: Emergency Medicine

## 2020-09-01 DIAGNOSIS — E785 Hyperlipidemia, unspecified: Secondary | ICD-10-CM

## 2020-09-01 NOTE — Telephone Encounter (Signed)
Patient requesting short supply of metFORMIN (GLUCOPHAGE-XR) 500 MG 24 hr tablet and atorvastatin (LIPITOR) 10 MG tablet. Patient would like the remaining supply sent to mail order. Patient contacted pharmacy and was advised to reach out to PCP    [id number] [place], [place] - [address] Phone:  [phone]  Fax:  [phone]

## 2020-09-02 ENCOUNTER — Other Ambulatory Visit: Payer: Self-pay | Admitting: Emergency Medicine

## 2020-09-02 DIAGNOSIS — E785 Hyperlipidemia, unspecified: Secondary | ICD-10-CM

## 2020-09-02 MED ORDER — ATORVASTATIN CALCIUM 10 MG PO TABS: ORAL_TABLET | ORAL | 1 refills | Status: DC

## 2020-09-02 MED ORDER — METFORMIN HCL ER 500 MG PO TB24: 500.0000 mg | ORAL_TABLET | Freq: Every day | ORAL | 0 refills | Status: DC

## 2020-09-02 NOTE — Telephone Encounter (Signed)
  Notes to clinic:   Alternative Requested:INSURANCE PREFERS 9- DAY SUPPLY PLEASE SEND ANOTHER RX WITH MORE REFILLS.  Requested Prescriptions  Pending Prescriptions Disp Refills   atorvastatin (LIPITOR) 10 MG tablet [Pharmacy Med Name: ATORVASTATIN 10 MG TABLET] 30 tablet 1    Sig: TAKE 1 TABLET DAILY AT 6PM      Cardiovascular:  Antilipid - Statins Failed - 09/02/2020  7:39 AM      Failed - Total Cholesterol in normal range and within 360 days    Cholesterol, Total  Date Value Ref Range Status  01/07/2020 202 (H) 100 - 199 mg/dL Final          Failed - LDL in normal range and within 360 days    LDL Chol Calc (NIH)  Date Value Ref Range Status  01/07/2020 113 (H) 0 - 99 mg/dL Final          Failed - Triglycerides in normal range and within 360 days    Triglycerides  Date Value Ref Range Status  01/07/2020 162 (H) 0 - 149 mg/dL Final          Passed - HDL in normal range and within 360 days    HDL  Date Value Ref Range Status  01/07/2020 61 >39 mg/dL Final          Passed - Patient is not pregnant      Passed - Valid encounter within last 12 months    Recent Outpatient Visits           3 weeks ago Essential hypertension   Primary Care at [facility], [place], MD   7 months ago Hyperlipidemia, unspecified hyperlipidemia type   Primary Care at [facility] Manus Rudd, MD   1 year ago Eustachian tube disorder, bilateral   Primary Care at [facility], Zoe A, MD   1 year ago Hyperlipidemia, unspecified hyperlipidemia type   Primary Care at [facility]   2 years ago Pressure in head   Primary Care at [facility], Manus Rudd, MD       Future Appointments             In 2 months Sagardia, Eilleen Kempf, MD Primary Care at [place], [facility]

## 2020-09-02 NOTE — Telephone Encounter (Signed)
Medication: atorvastatin (LIPITOR) 10 MG tablet [[id number]] , metFORMIN (GLUCOPHAGE-XR) 500 MG 24 hr tablet [[id number]] Requesting 90 day script- This script was sent was to wrong pharmacy. Can it be sent to pharmacy below please?  Has the patient contacted their pharmacy? YES  (Agent: If no, request that the patient contact the pharmacy for the refill.) (Agent: If yes, when and what did the pharmacy advise?)  Preferred Pharmacy (with phone number or street name): [facility] - [place], [place] - [address] AT [facility] [address] [place] [address] Phone: [phone] Fax: [phone] Hours: Not open 24 hours    Agent: Please be advised that RX refills may take up to 3 business days. We ask that you follow-up with your pharmacy.

## 2020-09-02 NOTE — Telephone Encounter (Signed)
Requested medications are on the active medication list  YES   Last visit 07/2020  Future visit scheduled Yes  Notes to clinic OV note from 07/2020 states to stop Metformin, however, it is still on medication list. Please assess.

## 2020-09-03 MED ORDER — METFORMIN HCL ER 500 MG PO TB24: 500.0000 mg | ORAL_TABLET | Freq: Every day | ORAL | 0 refills | Status: DC

## 2020-09-26 ENCOUNTER — Other Ambulatory Visit: Payer: Self-pay | Admitting: Emergency Medicine

## 2020-09-26 NOTE — Telephone Encounter (Signed)
Requested medication (s) are due for refill today: yes-see note dated 08/10/20: stop Metformin  Requested medication (s) are on the active medication list: yes  Last refill:  09/03/20  Future visit scheduled: yes  Notes to clinic:  please review- Is Metformin to be stopped or to dc?   Requested Prescriptions  Pending Prescriptions Disp Refills   metFORMIN (GLUCOPHAGE-XR) 500 MG 24 hr tablet [Pharmacy Med Name: METFORMIN HCL ER 500 MG TABLET] 30 tablet 0    Sig: TAKE 1 TABLET BY MOUTH EVERY DAY WITH BREAKFAST      Endocrinology:  Diabetes - Biguanides Passed - 09/26/2020  1:32 PM      Passed - Cr in normal range and within 360 days    Creatinine  Date Value Ref Range Status  08/30/2016 0.8 0.6 - 1.1 mg/dL Final   Creatinine, Ser  Date Value Ref Range Status  08/10/2020 0.88 0.57 - 1.00 mg/dL Final          Passed - HBA1C is between 0 and 7.9 and within 180 days    Hgb A1c MFr Bld  Date Value Ref Range Status  08/10/2020 5.6 4.8 - 5.6 % Final    Comment:             Prediabetes: 5.7 - 6.4          Diabetes: >6.4          Glycemic control for adults with diabetes: <7.0           Passed - AA eGFR in normal range and within 360 days    GFR calc Af Amer  Date Value Ref Range Status  08/10/2020 76 >59 mL/min/1.73 Final    Comment:    **In accordance with recommendations from the NKF-ASN Task force,**   [facility] is in the process of updating its eGFR calculation to the   2021 CKD-EPI creatinine equation that estimates kidney function   without a race variable.    GFR calc non Af Amer  Date Value Ref Range Status  08/10/2020 66 >59 mL/min/1.73 Final   EGFR  Date Value Ref Range Status  08/30/2016 83 (L) >90 ml/min/1.73 m2 Final    Comment:    eGFR is calculated using the CKD-EPI Creatinine Equation (2009)          Passed - Valid encounter within last 6 months    Recent Outpatient Visits           1 month ago Essential hypertension   Primary Care at [facility], [place], MD   8 months ago Hyperlipidemia, unspecified hyperlipidemia type   Primary Care at [place], MD   1 year ago Eustachian tube disorder, bilateral   Primary Care at [place], MD   1 year ago Hyperlipidemia, unspecified hyperlipidemia type   Primary Care at [facility]   2 years ago Pressure in head   Primary Care at [facility], Arlie Solomons, MD       Future Appointments             In 1 month Sagardia, Ines Bloomer, MD Primary Care at [place], [facility]

## 2020-10-07 ENCOUNTER — Ambulatory Visit: Payer: BC Managed Care – PPO | Admitting: Neurology

## 2020-10-13 ENCOUNTER — Ambulatory Visit: Payer: BC Managed Care – PPO | Admitting: Orthopaedic Surgery

## 2020-10-18 ENCOUNTER — Telehealth: Payer: Self-pay | Admitting: Emergency Medicine

## 2020-10-18 NOTE — Telephone Encounter (Signed)
Note   Pt husband dropped off wellness paperwork to be completed by his provider /(patient has TOC with Sagardia scheduled for 11/10/2020  was told by clinical that patient would need an annual exam To complete paperwork/ called patient let him know / patient stated he had dropped off this same paperwork and his provider was able to fill out in the past /  Offered to schedule an cpe patient declined /   Placed paperwork in Provider mail box in provider lounge

## 2020-10-19 ENCOUNTER — Ambulatory Visit (INDEPENDENT_AMBULATORY_CARE_PROVIDER_SITE_OTHER): Payer: BC Managed Care – PPO | Admitting: Orthopaedic Surgery

## 2020-10-19 ENCOUNTER — Ambulatory Visit: Payer: Self-pay

## 2020-10-19 ENCOUNTER — Encounter: Payer: Self-pay | Admitting: Orthopaedic Surgery

## 2020-10-19 ENCOUNTER — Other Ambulatory Visit: Payer: Self-pay

## 2020-10-19 VITALS — Ht 65.0 in | Wt 221.0 lb

## 2020-10-19 DIAGNOSIS — M7072 Other bursitis of hip, left hip: Secondary | ICD-10-CM | POA: Diagnosis not present

## 2020-10-19 DIAGNOSIS — M79605 Pain in left leg: Secondary | ICD-10-CM

## 2020-10-19 NOTE — Telephone Encounter (Signed)
I have spoken to pt husband to inform him that her form is ready for pick up as well. She also needs to sign her part and get a waist circumference.   They will come by this afternoon and ask for me. I still have their paperwork.

## 2020-10-19 NOTE — Telephone Encounter (Signed)
Pt has come by the office to sign her part. I have measured her waist and faxed the form to [facility] at [phone] with confirmation. The pt has received the original and a copy placed in scan.

## 2020-10-19 NOTE — Progress Notes (Signed)
Office Visit Note   Patient: Shelly Simpson           Date of Birth: 09/18/1945           MRN: [id number] Visit Date: 10/19/2020              Requested by: Horald Pollen, MD [place],  [place] [address] PCP: Horald Pollen, MD   Assessment & Plan: Visit Diagnoses:  1. Pain in left leg   2. Ischial bursitis of left side     Plan: Shelly Simpson appears to have left ischial bursitis.  She has pain directly over the ischium on the left side.  She has had a history of lumbar laminectomy but has not been experiencing any back pain or radiculopathy.  There is no evidence of any groin pain.  She takes 4 Advil a day that seems to alleviate her pain for the whole 24 hours and feels like her pain is improving.  For the moment will monitor her pain and have her return over the next 4 to 6 weeks if still having a problem or if it should exacerbate.  Follow-Up Instructions: Return if symptoms worsen or fail to improve.   Orders:  Orders Placed This Encounter  Procedures  . XR Pelvis 1-2 Views  . XR Lumbar Spine 2-3 Views   No orders of the defined types were placed in this encounter.     Procedures: No procedures performed   Clinical Data: No additional findings.   Subjective: Chief Complaint  Patient presents with  . Left Leg - Pain  Patient presents today for left leg pain. She said that she has pain in her left buttock area that travels down her leg. This pain started about a month ago without any injury. No numbness or tingling in her legs or feet. No lower back pain or groin pain. She said that her pain is worse in the morning upon sitting.  Has a history of back pain with prior laminectomy but not experiencing any back pain or radicular discomfort.  Not having any groin pain.  Has a history of breast cancer with follow-up bone scan.  Her last scan was negative  HPI  Review of Systems   Objective: Vital Signs: Ht 5\' 5"  (1.651 m)   Wt 221 lb (100.2  kg)   BMI 36.78 kg/m   Physical Exam Constitutional:      Appearance: She is well-developed and well-nourished.  HENT:     Mouth/Throat:     Mouth: Oropharynx is clear and moist.  Eyes:     Extraocular Movements: EOM normal.     Pupils: Pupils are equal, round, and reactive to light.  Pulmonary:     Effort: Pulmonary effort is normal.  Skin:    General: Skin is warm and dry.  Neurological:     Mental Status: She is alert and oriented to person, place, and time.  Psychiatric:        Mood and Affect: Mood and affect normal.        Behavior: Behavior normal.     Ortho Exam no percussible tenderness lumbar spine.  No discomfort with range of motion of either hip in internal and external rotation.  Motor exam intact.  Does have pain directly over the left ischium but relatively mild.  No pain over either SI joint or sacrum.  No pain over the greater trochanter left side  Specialty Comments:  No specialty comments available.  Imaging:  XR Lumbar Spine 2-3 Views  Result Date: 10/19/2020 Films lumbar spine obtained in 2 projections.  There is diffuse calcification of the abdominal aorta but without aneurysmal dilatation.  There is considerable degenerative disc disease at L4-5 with significant narrowing of the disc space.  Very minimal left-sided lumbar scoliosis of less than 5 degrees.  Considerable facet changes at L4-5 and L5-S1 consistent with arthritis.  No evidence of a compression fracture or listhesis  XR Pelvis 1-2 Views  Result Date: 10/19/2020 AP the pelvis did not demonstrate any specific pathology about either right or left hip.  Exam is benign for any hip pathology.  Abundant bowel gas obliterates some of the details.  No obvious fracture.  Patient is experiencing pain over the left ischial bursa    PMFS History: Patient Active Problem List   Diagnosis Date Noted  . Ischial bursitis of left side 10/19/2020  . Essential hypertension 08/10/2020  . Dyslipidemia  08/10/2020  . Seizure disorder ([place]) 08/10/2020  . Pain in left shoulder 08/20/2019  . Unilateral primary osteoarthritis, left knee 06/12/2017  . Malignant neoplasm of overlapping sites of right breast in female, estrogen receptor positive ([place]) 06/25/2014  . History of breast cancer   . Family history of ovarian cancer   . Family history of breast cancer in female    Past Medical History:  Diagnosis Date  . Allergy   . Cancer ([place])   . Family history of breast cancer in female   . Family history of ovarian cancer   . History of breast cancer   . Hypertension   . Seizures ([place])     Family History  Problem Relation Age of Onset  . Ovarian cancer Mother 62       also uterine sarcoma  . Breast cancer Daughter 78  . Other Father   . Breast cancer Maternal Uncle        female breast cancer; deceased 80  . Breast cancer Cousin 30       Currently 43  . Cancer Maternal Grandmother   . Heart disease Maternal Grandfather   . Hypertension Maternal Grandfather   . Hypertension Paternal Grandfather     Past Surgical History:  Procedure Laterality Date  . ABDOMINAL HYSTERECTOMY    . BREAST LUMPECTOMY Right 2009  . BREAST SURGERY    . JOINT REPLACEMENT    . SPINE SURGERY     laminectomy   Social History   Occupational History  . Not on file  Tobacco Use  . Smoking status: Never Smoker  . Smokeless tobacco: Never Used  Vaping Use  . Vaping Use: Never used  Substance and Sexual Activity  . Alcohol use: No  . Drug use: No  . Sexual activity: Not on file

## 2020-10-22 ENCOUNTER — Telehealth: Payer: Self-pay | Admitting: Emergency Medicine

## 2020-10-22 NOTE — Telephone Encounter (Signed)
Medication Refill - Medication:   amoxicillin (AMOXIL) 500 MG tablet   Has the patient contacted their pharmacy? Yes.  No refills, contact PCP.     Preferred Pharmacy (with phone number or street name):   [id number] [address], [place]  [place] [address]  Phone: [phone] Fax: [phone]    Agent: Please be advised that RX refills may take up to 3 business days. We ask that you follow-up with your pharmacy.

## 2020-10-22 NOTE — Telephone Encounter (Signed)
Pt needs OV for an antibiotic

## 2020-11-10 ENCOUNTER — Ambulatory Visit: Payer: BC Managed Care – PPO | Admitting: Emergency Medicine

## 2020-11-10 ENCOUNTER — Other Ambulatory Visit: Payer: Self-pay

## 2020-11-10 ENCOUNTER — Encounter: Payer: Self-pay | Admitting: Emergency Medicine

## 2020-11-10 VITALS — BP 128/77 | HR 80 | Temp 97.5°F | Resp 16 | Ht 65.0 in | Wt 227.0 lb

## 2020-11-10 DIAGNOSIS — C50811 Malignant neoplasm of overlapping sites of right female breast: Secondary | ICD-10-CM

## 2020-11-10 DIAGNOSIS — G40909 Epilepsy, unspecified, not intractable, without status epilepticus: Secondary | ICD-10-CM | POA: Diagnosis not present

## 2020-11-10 DIAGNOSIS — I10 Essential (primary) hypertension: Secondary | ICD-10-CM | POA: Diagnosis not present

## 2020-11-10 DIAGNOSIS — E785 Hyperlipidemia, unspecified: Secondary | ICD-10-CM

## 2020-11-10 DIAGNOSIS — Z170 Estrogen receptor positive status [ER+]: Secondary | ICD-10-CM

## 2020-11-10 DIAGNOSIS — Z7689 Persons encountering health services in other specified circumstances: Secondary | ICD-10-CM

## 2020-11-10 MED ORDER — METFORMIN HCL ER 500 MG PO TB24: ORAL_TABLET | ORAL | 3 refills | Status: DC

## 2020-11-10 MED ORDER — AMOXICILLIN 500 MG PO TABS: ORAL_TABLET | ORAL | 0 refills | Status: DC

## 2020-11-10 MED ORDER — PHENYTOIN SODIUM EXTENDED 100 MG PO CAPS: 200.0000 mg | ORAL_CAPSULE | Freq: Every day | ORAL | 3 refills | Status: DC

## 2020-11-10 MED ORDER — ATORVASTATIN CALCIUM 10 MG PO TABS
ORAL_TABLET | ORAL | 3 refills | Status: DC
Start: 2020-11-10 — End: 2021-10-07

## 2020-11-10 MED ORDER — OLMESARTAN MEDOXOMIL-HCTZ 20-12.5 MG PO TABS: 1.0000 | ORAL_TABLET | Freq: Every day | ORAL | 3 refills | Status: DC

## 2020-11-10 MED ORDER — ALBUTEROL SULFATE HFA 108 (90 BASE) MCG/ACT IN AERS: 2.0000 | INHALATION_SPRAY | Freq: Four times a day (QID) | RESPIRATORY_TRACT | 3 refills | Status: AC | PRN

## 2020-11-10 MED ORDER — FLUTICASONE PROPIONATE 50 MCG/ACT NA SUSP: 2.0000 | Freq: Every day | NASAL | 0 refills | Status: DC | PRN

## 2020-11-10 NOTE — Patient Instructions (Addendum)
   If you have lab work done today you will be contacted with your lab results within the next 2 weeks.  If you have not heard from us then please contact us. The fastest way to get your results is to register for My Chart.   IF you received an x-ray today, you will receive an invoice from [facility]. Please contact  Radiology at [phone] with questions or concerns regarding your invoice.   IF you received labwork today, you will receive an invoice from [facility]. Please contact [facility] at [phone] with questions or concerns regarding your invoice.   Our billing staff will not be able to assist you with questions regarding bills from these companies.  You will be contacted with the lab results as soon as they are available. The fastest way to get your results is to activate your My Chart account. Instructions are located on the last page of this paperwork. If you have not heard from us regarding the results in 2 weeks, please contact this office.       Health Maintenance After Age 65 After age 65, you are at a higher risk for certain long-term diseases and infections as well as injuries from falls. Falls are a major cause of broken bones and head injuries in people who are older than age 65. Getting regular preventive care can help to keep you healthy and well. Preventive care includes getting regular testing and making lifestyle changes as recommended by your health care provider. Talk with your health care provider about:  Which screenings and tests you should have. A screening is a test that checks for a disease when you have no symptoms.  A diet and exercise plan that is right for you. What should I know about screenings and tests to prevent falls? Screening and testing are the best ways to find a health problem early. Early diagnosis and treatment give you the best chance of managing medical conditions that are common after age 65. Certain conditions and  lifestyle choices may make you more likely to have a fall. Your health care provider may recommend:  Regular vision checks. Poor vision and conditions such as cataracts can make you more likely to have a fall. If you wear glasses, make sure to get your prescription updated if your vision changes.  Medicine review. Work with your health care provider to regularly review all of the medicines you are taking, including over-the-counter medicines. Ask your health care provider about any side effects that may make you more likely to have a fall. Tell your health care provider if any medicines that you take make you feel dizzy or sleepy.  Osteoporosis screening. Osteoporosis is a condition that causes the bones to get weaker. This can make the bones weak and cause them to break more easily.  Blood pressure screening. Blood pressure changes and medicines to control blood pressure can make you feel dizzy.  Strength and balance checks. Your health care provider may recommend certain tests to check your strength and balance while standing, walking, or changing positions.  Foot health exam. Foot pain and numbness, as well as not wearing proper footwear, can make you more likely to have a fall.  Depression screening. You may be more likely to have a fall if you have a fear of falling, feel emotionally low, or feel unable to do activities that you used to do.  Alcohol use screening. Using too much alcohol can affect your balance and may make you more   likely to have a fall. What actions can I take to lower my risk of falls? General instructions  Talk with your health care provider about your risks for falling. Tell your health care provider if: ? You fall. Be sure to tell your health care provider about all falls, even ones that seem minor. ? You feel dizzy, sleepy, or off-balance.  Take over-the-counter and prescription medicines only as told by your health care provider. These include any  supplements.  Eat a healthy diet and maintain a healthy weight. A healthy diet includes low-fat dairy products, low-fat (lean) meats, and fiber from whole grains, beans, and lots of fruits and vegetables. Home safety  Remove any tripping hazards, such as rugs, cords, and clutter.  Install safety equipment such as grab bars in bathrooms and safety rails on stairs.  Keep rooms and walkways well-lit. Activity  Follow a regular exercise program to stay fit. This will help you maintain your balance. Ask your health care provider what types of exercise are appropriate for you.  If you need a cane or walker, use it as recommended by your health care provider.  Wear supportive shoes that have nonskid soles.   Lifestyle  Do not drink alcohol if your health care provider tells you not to drink.  If you drink alcohol, limit how much you have: ? 0-1 drink a day for women. ? 0-2 drinks a day for men.  Be aware of how much alcohol is in your drink. In the [place] one drink equals one typical bottle of beer (12 oz), one-half glass of wine (5 oz), or one shot of hard liquor (1 oz).  Do not use any products that contain nicotine or tobacco, such as cigarettes and e-cigarettes. If you need help quitting, ask your health care provider. Summary  Having a healthy lifestyle and getting preventive care can help to protect your health and wellness after age 65.  Screening and testing are the best way to find a health problem early and help you avoid having a fall. Early diagnosis and treatment give you the best chance for managing medical conditions that are more common for people who are older than age 65.  Falls are a major cause of broken bones and head injuries in people who are older than age 65. Take precautions to prevent a fall at home.  Work with your health care provider to learn what changes you can make to improve your health and wellness and to prevent falls. This information is not intended  to replace advice given to you by your health care provider. Make sure you discuss any questions you have with your health care provider. Document Revised: 12/05/2018 Document Reviewed: 06/27/2017 Elsevier Patient Education  2021 [employer]  
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
## 2020-11-11 ENCOUNTER — Other Ambulatory Visit: Payer: Self-pay | Admitting: Emergency Medicine

## 2021-01-07 DIAGNOSIS — L255 Unspecified contact dermatitis due to plants, except food: Secondary | ICD-10-CM | POA: Diagnosis not present

## 2021-01-21 ENCOUNTER — Other Ambulatory Visit: Payer: Self-pay | Admitting: Emergency Medicine

## 2021-01-21 ENCOUNTER — Telehealth: Payer: Self-pay | Admitting: Emergency Medicine

## 2021-01-21 NOTE — Telephone Encounter (Signed)
Please Advise, after reviewing the patient's chart I did not see where changing the atorvastatin 10 mg to 20 mg was discussed. Called pt to verify, but no answer.

## 2021-01-21 NOTE — Telephone Encounter (Signed)
I agree with you.  No record of medication change discussion.  Continue 10 mg daily until we can address this at the next visit.  Thank you.

## 2021-01-21 NOTE — Telephone Encounter (Signed)
Patient had discussed changing the atorvastatin (LIPITOR) 10 MG tablet to 20MG  with provider.  She is wanting to know if she is supposed to keep the 10MG  or was it supposed to change to 20Mg .  Please advise

## 2021-01-25 NOTE — Telephone Encounter (Signed)
Called and spoke to pt and she states that she will continue with just taking the 10 mg of atorvastatin.

## 2021-02-20 ENCOUNTER — Encounter: Payer: Self-pay | Admitting: Emergency Medicine

## 2021-02-21 NOTE — Telephone Encounter (Signed)
Please Advise

## 2021-02-28 ENCOUNTER — Other Ambulatory Visit: Payer: Self-pay | Admitting: Emergency Medicine

## 2021-02-28 DIAGNOSIS — G40909 Epilepsy, unspecified, not intractable, without status epilepticus: Secondary | ICD-10-CM

## 2021-02-28 MED ORDER — PHENYTOIN SODIUM EXTENDED 100 MG PO CAPS: 300.0000 mg | ORAL_CAPSULE | Freq: Every day | ORAL | 1 refills | Status: DC

## 2021-03-10 ENCOUNTER — Other Ambulatory Visit: Payer: Self-pay | Admitting: Adult Health

## 2021-03-10 DIAGNOSIS — Z1231 Encounter for screening mammogram for malignant neoplasm of breast: Secondary | ICD-10-CM

## 2021-03-14 ENCOUNTER — Other Ambulatory Visit: Payer: Self-pay

## 2021-03-14 ENCOUNTER — Ambulatory Visit
Admission: RE | Admit: 2021-03-14 | Discharge: 2021-03-14 | Disposition: A | Discharge status: Home / Self Care | Payer: BC Managed Care – PPO | Source: Ambulatory Visit | Attending: Adult Health | Admitting: Adult Health

## 2021-03-14 DIAGNOSIS — Z1231 Encounter for screening mammogram for malignant neoplasm of breast: Secondary | ICD-10-CM | POA: Diagnosis not present

## 2021-03-14 HISTORY — DX: Malignant neoplasm of unspecified site of unspecified female breast: C50.919

## 2021-05-03 ENCOUNTER — Telehealth: Payer: Self-pay | Admitting: Orthopaedic Surgery

## 2021-05-03 NOTE — Telephone Encounter (Signed)
Pt states since Dr. Durward Fortes has no opening until next wk she will wait and see how her knee feels. She will call if appt is still needed

## 2021-05-13 DIAGNOSIS — M25562 Pain in left knee: Secondary | ICD-10-CM | POA: Diagnosis not present

## 2021-05-18 DIAGNOSIS — D123 Benign neoplasm of transverse colon: Secondary | ICD-10-CM | POA: Diagnosis not present

## 2021-05-18 DIAGNOSIS — K635 Polyp of colon: Secondary | ICD-10-CM | POA: Diagnosis not present

## 2021-05-18 DIAGNOSIS — K5730 Diverticulosis of large intestine without perforation or abscess without bleeding: Secondary | ICD-10-CM | POA: Diagnosis not present

## 2021-05-18 DIAGNOSIS — K648 Other hemorrhoids: Secondary | ICD-10-CM | POA: Diagnosis not present

## 2021-05-18 DIAGNOSIS — D126 Benign neoplasm of colon, unspecified: Secondary | ICD-10-CM | POA: Diagnosis not present

## 2021-05-18 DIAGNOSIS — Z1211 Encounter for screening for malignant neoplasm of colon: Secondary | ICD-10-CM | POA: Diagnosis not present

## 2021-05-18 DIAGNOSIS — Z86010 Personal history of colonic polyps: Secondary | ICD-10-CM | POA: Diagnosis not present

## 2021-05-19 ENCOUNTER — Encounter: Payer: Self-pay | Admitting: Orthopaedic Surgery

## 2021-05-19 ENCOUNTER — Ambulatory Visit (INDEPENDENT_AMBULATORY_CARE_PROVIDER_SITE_OTHER): Payer: BC Managed Care – PPO | Admitting: Orthopaedic Surgery

## 2021-05-19 ENCOUNTER — Other Ambulatory Visit: Payer: Self-pay

## 2021-05-19 DIAGNOSIS — M25562 Pain in left knee: Secondary | ICD-10-CM

## 2021-05-19 DIAGNOSIS — M1712 Unilateral primary osteoarthritis, left knee: Secondary | ICD-10-CM | POA: Diagnosis not present

## 2021-05-19 MED ORDER — LIDOCAINE HCL 1 % IJ SOLN
2.0000 mL | INTRAMUSCULAR | Status: AC | PRN
  Administered 2021-05-19: 2 mL

## 2021-05-19 MED ORDER — METHYLPREDNISOLONE ACETATE 40 MG/ML IJ SUSP
80.0000 mg | INTRAMUSCULAR | Status: AC | PRN
  Administered 2021-05-19: 80 mg via INTRA_ARTICULAR

## 2021-05-19 MED ORDER — BUPIVACAINE HCL 0.25 % IJ SOLN
2.0000 mL | INTRAMUSCULAR | Status: AC | PRN
  Administered 2021-05-19: 2 mL via INTRA_ARTICULAR

## 2021-05-19 NOTE — Progress Notes (Signed)
Office Visit Note   Patient: Shelly Simpson           Date of Birth: [birth year]           MRN: [id number] Visit Date: 05/19/2021              Requested by: Horald Pollen, [place],  [place] [address] PCP: Horald Pollen, MD   Assessment & Plan: Visit Diagnoses:  1. Unilateral primary osteoarthritis, left knee     Plan: Shelly Simpson had a recent acute exacerbation of her left knee pain.  She relates that she twisted her knee when trying to reach under her legs to grab a cell phone.  Injury occurred about 2 weeks ago.  She had a lot of swelling and used ice.  She notes that it still awakening her at night.  She is taking Tylenol.  She went to the [facility] after-hours clinic and had x-rays and was told she might have a tear of the meniscus.  She has had difficulty climbing stairs.  I did review the films from the [facility] clinic and that she does have significant arthritis particularly in the lateral compartment of the left knee with large osteophytes increased valgus and narrowing of the joint line.  Suspect that is really the cause of her pain.  Will inject her knee with cortisone and monitor response  Follow-Up Instructions: Return if symptoms worsen or fail to improve.   Orders:  No orders of the defined types were placed in this encounter.  No orders of the defined types were placed in this encounter.     Procedures: Large Joint Inj: L knee on 05/19/2021 5:01 PM Indications: pain and diagnostic evaluation Details: 25 G 1.5 in needle, anterolateral approach  Arthrogram: No  Medications: 2 mL lidocaine 1 %; 80 mg methylPREDNISolone acetate 40 MG/ML; 2 mL bupivacaine 0.25 % Procedure, treatment alternatives, risks and benefits explained, specific risks discussed. Consent was given by the patient. Patient was prepped and draped in the usual sterile fashion.      Clinical Data: No additional findings.   Subjective: Chief  Complaint  Patient presents with   Left Knee - Pain  Patient presents today for left knee pain. She states that she twisted her knee while reaching under her legs in the car to grab her cellphone two weeks ago. She states that her knee swelled and she iced it for a couple days. She said that her pain has remained the same. More pain anteriorly. She went to [facility] and had x-rays taken. She states that she has difficulty climbing stairs or extending her leg after prolonged sitting. She takes Tylenol for pain relief.  Has had previous right total knee replacement years ago and doing well.  HPI  Review of Systems   Objective: Vital Signs: There were no vitals taken for this visit.  Physical Exam Constitutional:      Appearance: She is well-developed.  Pulmonary:     Effort: Pulmonary effort is normal.  Skin:    General: Skin is warm and dry.  Neurological:     Mental Status: She is alert and oriented to person, place, and time.  Psychiatric:        Behavior: Behavior normal.    Ortho Exam awake alert and oriented x3.  Comfortable sitting.  The left knee was not hot warm or red.  No effusion.  Mostly lateral joint pain and slight increased valgus with weightbearing.  No medial joint pain.  Full extension and flexed at least 100 degrees without instability.  No popliteal pain or mass or calf pain  Specialty Comments:  No specialty comments available.  Imaging: No results found.   PMFS History: Patient Active Problem List   Diagnosis Date Noted   Ischial bursitis of left side 10/19/2020   Essential hypertension 08/10/2020   Dyslipidemia 08/10/2020   Seizure disorder ([place]) 08/10/2020   Pain in left shoulder 08/20/2019   Unilateral primary osteoarthritis, left knee 06/12/2017   Malignant neoplasm of overlapping sites of right breast in female, estrogen receptor positive ([place]) 06/25/2014   History of breast cancer    Family history of ovarian cancer    Family history of  breast cancer in female    Past Medical History:  Diagnosis Date   Allergy    Breast cancer ([place])    Cancer ([place])    Family history of breast cancer in female    Family history of ovarian cancer    History of breast cancer    Hypertension    Seizures ([place])     Family History  Problem Relation Age of Onset   Ovarian cancer Mother 86       also uterine sarcoma   Other Father    Breast cancer Daughter 74   Breast cancer Maternal Aunt    Breast cancer Maternal Uncle        female breast cancer; deceased 22   Cancer Maternal Grandmother    Heart disease Maternal Grandfather    Hypertension Maternal Grandfather    Hypertension Paternal Grandfather    Breast cancer Cousin 28       Currently 60    Past Surgical History:  Procedure Laterality Date   ABDOMINAL HYSTERECTOMY     BREAST LUMPECTOMY Right 2009   BREAST SURGERY     JOINT REPLACEMENT     SPINE SURGERY     laminectomy   Social History   Occupational History   Not on file  Tobacco Use   Smoking status: Never   Smokeless tobacco: Never  Vaping Use   Vaping Use: Never used  Substance and Sexual Activity   Alcohol use: No   Drug use: No   Sexual activity: Not on file

## 2021-06-06 ENCOUNTER — Telehealth: Payer: Self-pay | Admitting: Adult Health

## 2021-06-06 ENCOUNTER — Inpatient Hospital Stay: Payer: BC Managed Care – PPO | Admitting: Adult Health

## 2021-06-06 NOTE — Telephone Encounter (Signed)
Rescheduled per 10/10 sch msg, attempted to call pt x2, left msg on pts voicemail

## 2021-06-06 NOTE — Telephone Encounter (Signed)
Called patient due to her 1130 appointment and her not having arrived to the front desk by 1135.  She was unaware that her appointment was changed from 10/17 to 10/10.  I sent a high priority scheduling message to have her appt today canceled and rescheduled.    Wilber Bihari, NP

## 2021-06-09 ENCOUNTER — Inpatient Hospital Stay: Payer: BC Managed Care – PPO | Admitting: Adult Health

## 2021-06-13 ENCOUNTER — Encounter: Payer: BC Managed Care – PPO | Admitting: Adult Health

## 2021-06-16 ENCOUNTER — Other Ambulatory Visit: Payer: Self-pay

## 2021-06-16 ENCOUNTER — Inpatient Hospital Stay: Payer: BC Managed Care – PPO | Attending: Adult Health | Admitting: Adult Health

## 2021-06-16 VITALS — BP 136/61 | HR 88 | Temp 97.5°F | Resp 16 | Ht 65.0 in | Wt 223.2 lb

## 2021-06-16 DIAGNOSIS — C50811 Malignant neoplasm of overlapping sites of right female breast: Secondary | ICD-10-CM

## 2021-06-16 DIAGNOSIS — Z23 Encounter for immunization: Secondary | ICD-10-CM | POA: Diagnosis not present

## 2021-06-16 DIAGNOSIS — Z923 Personal history of irradiation: Secondary | ICD-10-CM | POA: Insufficient documentation

## 2021-06-16 DIAGNOSIS — Z9221 Personal history of antineoplastic chemotherapy: Secondary | ICD-10-CM | POA: Diagnosis not present

## 2021-06-16 DIAGNOSIS — M8580 Other specified disorders of bone density and structure, unspecified site: Secondary | ICD-10-CM | POA: Diagnosis not present

## 2021-06-16 DIAGNOSIS — Z853 Personal history of malignant neoplasm of breast: Secondary | ICD-10-CM | POA: Insufficient documentation

## 2021-06-16 DIAGNOSIS — Z170 Estrogen receptor positive status [ER+]: Secondary | ICD-10-CM

## 2021-06-16 MED ORDER — PNEUMOCOCCAL VAC POLYVALENT 25 MCG/0.5ML IJ INJ
0.5000 mL | INJECTION | Freq: Once | INTRAMUSCULAR | Status: DC
  Filled 2021-06-16: qty 0.5

## 2021-06-16 NOTE — Progress Notes (Signed)
CLINIC:  Survivorship   REASON FOR VISIT:  Routine follow-up for history of breast cancer.   BRIEF ONCOLOGIC HISTORY:  Per Dr. Jana Hakim note in 10/2018:   75 y.o. [place] woman status post right lumpectomy and sentinel lymph node sampling 04/08/2008 for a pT1c pN1, stage IIA invasive ductal carcinoma of overlapping sites, grade 2, estrogen receptor 99% and progesterone receptor 51% positive, with an MIB-1 of 10%, and HER-2 amplification by CISH, with a ratio of 2.45.   (1) treated adjuvantly with carboplatin and Taxotere x2 cycles, poorly tolerated, followed by 4 cycles of weekly carboplatin Abraxane completed 07/21/2008             (a) received trastuzumab starting with chemotherapy, completing a year (see Dr Julien Girt note 12/11/2011)   (2) adjuvant radiation therapy to the right breast completed 10/30/2008   (3) started on letrozole March of 2010, switched to anastrozole February of 2011 because of arthralgias/myalgias, anastrozole discontinued October 2016 when the patient was released from oncologic follow-up   (4) genetic testing sent 05/28/2014-- the following genes showed no abnormality: ATM, BARD1, BRCA1, BRCA2, BRIP1, CDH1, CHEK2, EPCAM, MLH1, MRE11A, MSH2, MSH6, MUTYH, NBN, NF1, PALB2, PMS2, PTEN, RAD50, RAD51C, RAD51D, SMARCA4, STK11, and TP53.   (5) the patient is status post TAH/BSO remotely   INTERVAL HISTORY:  [name] presents to the [facility] today for routine follow-up for her history of breast cancer.  She reviewed with me her cancer story, and her family history, which includes her mom who had ovarian cancer, and her uncle who had female breast cancer.    [place] is up to date with seeing her PCP and with her cancer screenings.  She also underwent bone density testing at that time which demonstrated mild osteopenia with a T score of -1.7 in the left femur.  She is taking calcium and vitamin d.    Rosilyn is doing well today and is without concerns.  She  remains active.    REVIEW OF SYSTEMS:  Review of Systems  Constitutional:  Negative for appetite change, chills, fatigue, fever and unexpected weight change.  HENT:   Negative for hearing loss and lump/mass.   Eyes:  Negative for eye problems and icterus.  Respiratory:  Negative for chest tightness, cough and shortness of breath.   Cardiovascular:  Negative for chest pain, leg swelling and palpitations.  Gastrointestinal:  Negative for abdominal distention, abdominal pain, constipation, diarrhea, nausea and vomiting.  Endocrine: Negative for hot flashes.  Musculoskeletal:  Negative for arthralgias.  Skin:  Negative for itching and rash.  Neurological:  Negative for dizziness, extremity weakness, headaches and numbness.  Hematological:  Negative for adenopathy. Does not bruise/bleed easily.  Psychiatric/Behavioral:  Negative for depression. The patient is not nervous/anxious.  Breast: Denies any new nodularity, masses, tenderness, nipple changes, or nipple discharge.       PAST MEDICAL/SURGICAL HISTORY:  Past Medical History:  Diagnosis Date   Allergy    Breast cancer ([place])    Cancer ([place])    Family history of breast cancer in female    Family history of ovarian cancer    History of breast cancer    Hypertension    Seizures ([place])    Past Surgical History:  Procedure Laterality Date   ABDOMINAL HYSTERECTOMY     BREAST LUMPECTOMY Right 2009   BREAST SURGERY     JOINT REPLACEMENT     SPINE SURGERY     laminectomy     ALLERGIES:  Allergies  Allergen  Reactions   Iohexol      Desc: pt states in '80's wheezing; dyspnea w/ contrast--pt needs pre meds in future; slg 04/24/2008    Sulfa Antibiotics      CURRENT MEDICATIONS:  Outpatient Encounter Medications as of 06/16/2021  Medication Sig   albuterol (PROAIR HFA) 108 (90 Base) MCG/ACT inhaler Inhale 2 puffs into the lungs every 6 (six) hours as needed.   amoxicillin (AMOXIL) 500 MG tablet TAKE 4 TABLETS BY MOUTH FOR ONE  DOSE 1 HR PRIOR TO DENTAL SURGERY   atorvastatin (LIPITOR) 10 MG tablet TAKE 1 TABLET DAILY AT 6PM   calcium citrate-vitamin D (CITRACAL+D) 315-200 MG-UNIT per tablet Take 1 tablet by mouth 2 (two) times daily.   cetirizine (ZYRTEC) 10 MG tablet Take 10 mg by mouth daily as needed.    fluticasone (FLONASE) 50 MCG/ACT nasal spray Place 2 sprays into both nostrils daily as needed.   metFORMIN (GLUCOPHAGE-XR) 500 MG 24 hr tablet TAKE 1 TABLET BY MOUTH EVERY DAY WITH BREAKFAST   Multiple Vitamin (MULTIVITAMIN) capsule Take 1 capsule by mouth daily.   olmesartan-hydrochlorothiazide (BENICAR HCT) 20-12.5 MG tablet Take 1 tablet by mouth daily.   phenytoin (DILANTIN) 100 MG ER capsule Take 3 capsules (300 mg total) by mouth at bedtime.   Facility-Administered Encounter Medications as of 06/16/2021  Medication   pneumococcal 23 valent vaccine (PNEUMOVAX-23) injection 0.5 mL     ONCOLOGIC FAMILY HISTORY:  Family History  Problem Relation Age of Onset   Ovarian cancer Mother 33       also uterine sarcoma   Other Father    Breast cancer Daughter 73   Breast cancer Maternal Aunt    Breast cancer Maternal Uncle        female breast cancer; deceased 46   Cancer Maternal Grandmother    Heart disease Maternal Grandfather    Hypertension Maternal Grandfather    Hypertension Paternal Grandfather    Breast cancer Cousin 1       Currently 48    GENETIC COUNSELING/TESTING: Se above.  Repeat referral sent  SOCIAL HISTORY:  Social History   Socioeconomic History   Marital status: Married    Spouse name: Not on file   Number of children: 2   Years of education: Not on file   Highest education level: Not on file  Occupational History   Not on file  Tobacco Use   Smoking status: Never   Smokeless tobacco: Never  Vaping Use   Vaping Use: Never used  Substance and Sexual Activity   Alcohol use: No   Drug use: No   Sexual activity: Not on file  Other Topics Concern   Not on file  Social  History Narrative   Not on file   Social Determinants of Health   Financial Resource Strain: Not on file  Food Insecurity: Not on file  Transportation Needs: Not on file  Physical Activity: Not on file  Stress: Not on file  Social Connections: Not on file  Intimate Partner Violence: Not on file      PHYSICAL EXAMINATION:  Vital Signs: Vitals:   06/16/21 1142  BP: 136/61  Pulse: 88  Resp: 16  Temp: (!) 97.5 F (36.4 C)  SpO2: 99%   Filed Weights   06/16/21 1142  Weight: 223 lb 3.2 oz (101.2 kg)   General: Well-nourished, well-appearing female in no acute distress.  Unaccompanied today.   HEENT: Head is normocephalic.  Pupils equal and reactive to light. Conjunctivae clear without  exudate.  Sclerae anicteric. Oral mucosa is pink, moist.  Oropharynx is pink without lesions or erythema.  Lymph: No cervical, supraclavicular, or infraclavicular lymphadenopathy noted on palpation.  Cardiovascular: Regular rate and rhythm.Marland Kitchen Respiratory: Clear to auscultation bilaterally. Chest expansion symmetric; breathing non-labored.  Breast Exam:  -Left breast: No appreciable masses on palpation. No skin redness, thickening, or peau d'orange appearance; no nipple retraction or nipple discharge; -Right breast: No appreciable masses on palpation. No skin redness, thickening, or peau d'orange appearance; no nipple retraction or nipple discharge; mild distortion in symmetry at previous lumpectomy site well healed scar without erythema or nodularity. -Axilla: No axillary adenopathy bilaterally.  GI: Abdomen soft and round; non-tender, non-distended. Bowel sounds normoactive. No hepatosplenomegaly.   GU: Deferred.  Neuro: No focal deficits. Steady gait.  Psych: Mood and affect normal and appropriate for situation.  MSK: No focal spinal tenderness to palpation, full range of motion in bilateral upper extremities Extremities: No edema. Skin: Warm and dry.  LABORATORY DATA:  None for this  visit   DIAGNOSTIC IMAGING:  Most recent mammogram:  CLINICAL DATA:  Screening.   EXAM: DIGITAL SCREENING BILATERAL MAMMOGRAM WITH TOMOSYNTHESIS AND CAD   TECHNIQUE: Bilateral screening digital craniocaudal and mediolateral oblique mammograms were obtained. Bilateral screening digital breast tomosynthesis was performed. The images were evaluated with computer-aided detection.   COMPARISON:  Previous exam(s).   ACR Breast Density Category c: The breast tissue is heterogeneously dense, which may obscure small masses.   FINDINGS: There are no findings suspicious for malignancy.   IMPRESSION: No mammographic evidence of malignancy. A result letter of this screening mammogram will be mailed directly to the patient.   RECOMMENDATION: Screening mammogram in one year. (Code:SM-B-01Y)   BI-RADS CATEGORY  1: Negative.     Electronically Signed   By: Abelardo Diesel M.D.   On: 03/16/2021 08:28   ASSESSMENT AND PLAN:  Ms.. Ihrig is a pleasant 75 y.o. female with history of Stage IIA right breast invasive ductal carcinoma, ER+/PR+/HER2+, diagnosed in 03/2008, treated with lumpectomy, adjuvant chemotherapy, manintenance Trastuzumab, adjuvant radiation therapy, and anti-estrogen therapy with Anastrozole x 5 years completed in 05/2015.  She [facility] for surveillance and routine follow-up.   1. History of breast cancer:  Ms. Nader is currently clinically and radiographically without evidence of disease or recurrence of breast cancer. She will be due for mammogram in 02/2022.    We will see her back in one year for continued long term surveillance.  I encouraged her to call me with any questions or concerns before her next visit at the cancer center, and I would be happy to see her sooner, if needed.    2. Bone health:  Given Ms. Essex's age, history of breast cancer, and her previous anti-estrogen therapy with Anastrozole, she is at risk for bone demineralization.  She was given education on bone health.  3. Cancer screening:  Due to Ms. Sledd's history and her age, she should receive screening for skin cancers, colon cancer, and gynecologic cancers. She was encouraged to follow-up with her PCP for appropriate cancer screenings.   4. Health maintenance and wellness promotion: Ms. Schrum was encouraged to consume 5-7 servings of fruits and vegetables per day. She was also encouraged to engage in moderate to vigorous exercise for 30 minutes per day most days of the week. She was instructed to limit her alcohol consumption and continue to abstain from tobacco use.      Dispo:  -Return to cancer center  in one year for LTS follow up -Mammogram in 02/2022 -Bone density in 01/2022   Total encounter time: 20 minutes* for face-to-face visit time, chart review, lab review, order entry, and care coordination.  Wilber Bihari, NP 06/17/21 6:28 PM Medical Oncology and Hematology [facility] [address], [place] [address] Tel. [phone]    Fax. [phone]   *Total Encounter Time as defined by the Centers for Medicare and Medicaid Services includes, in addition to the face-to-face time of a patient visit (documented in the note above) non-face-to-face time: obtaining and reviewing outside history, ordering and reviewing medications, tests or procedures, care coordination (communications with other health care professionals or caregivers) and documentation in the medical record.    Note: PRIMARY CARE PROVIDER Agustina Caroli Hoffman, [place] [phone]

## 2021-06-19 ENCOUNTER — Other Ambulatory Visit: Payer: Self-pay | Admitting: Emergency Medicine

## 2021-06-19 DIAGNOSIS — G40909 Epilepsy, unspecified, not intractable, without status epilepticus: Secondary | ICD-10-CM

## 2021-08-08 ENCOUNTER — Telehealth: Payer: BC Managed Care – PPO | Admitting: Family

## 2021-08-08 DIAGNOSIS — E1169 Type 2 diabetes mellitus with other specified complication: Secondary | ICD-10-CM

## 2021-08-08 DIAGNOSIS — I10 Essential (primary) hypertension: Secondary | ICD-10-CM

## 2021-08-08 DIAGNOSIS — R079 Chest pain, unspecified: Secondary | ICD-10-CM | POA: Diagnosis not present

## 2021-08-08 DIAGNOSIS — E785 Hyperlipidemia, unspecified: Secondary | ICD-10-CM

## 2021-08-08 NOTE — Progress Notes (Signed)
Virtual Visit Consent   Shelly Simpson, you are scheduled for a virtual visit with a [place] provider today.     Just as with appointments in the office, your consent must be obtained to participate.  Your consent will be active for this visit and any virtual visit you may have with one of our providers in the next 365 days.     If you have a MyChart account, a copy of this consent can be sent to you electronically.  All virtual visits are billed to your insurance company just like a traditional visit in the office.    As this is a virtual visit, video technology does not allow for your provider to perform a traditional examination.  This may limit your provider's ability to fully assess your condition.  If your provider identifies any concerns that need to be evaluated in person or the need to arrange testing (such as labs, EKG, etc.), we will make arrangements to do so.     Although advances in technology are sophisticated, we cannot ensure that it will always work on either your end or our end.  If the connection with a video visit is poor, the visit may have to be switched to a telephone visit.  With either a video or telephone visit, we are not always able to ensure that we have a secure connection.     I need to obtain your verbal consent now.   Are you willing to proceed with your visit today?    Shelly Simpson has provided verbal consent on 08/08/2021 for a virtual visit (video or telephone).   Evelina Dun, FNP   Date: 08/08/2021 6:48 PM   Virtual Visit via Video Note   I, Evelina Dun, connected with  Shelly Simpson  ([id number], 01-27-1946) on 08/08/21 at  6:30 PM EST by a video-enabled telemedicine application and verified that I am speaking with the correct person using two identifiers.  Location: Patient: Virtual Visit Location Patient: Home Provider: Virtual Visit Location Provider: Home   I discussed the limitations of evaluation and management by telemedicine  and the availability of in person appointments. The patient expressed understanding and agreed to proceed.    History of Present Illness: Shelly Simpson is a 75 y.o. who identifies as a female who was assigned female at birth, and is being seen today for chest pain that occurred today that lasted 2-3 seconds. She reports her BP was 121/73 and heart rate of 79. She does have HTN, DM, and hyperlipidemia. She states this chest pain resolved and has not occurred since 4 pm. Denies any SOB, chest pain with exertion, peripheral edema.   HPI: Chest Pain  This is a new problem. The current episode started today. The onset quality is sudden. The problem occurs 2 to 4 times per day (2-3 seconds). The pain is present in the lateral region. The pain is at a severity of 5/10. The pain is moderate. The quality of the pain is described as dull. The pain does not radiate. Pertinent negatives include no back pain, cough, dizziness, exertional chest pressure, headaches, irregular heartbeat, lower extremity edema or malaise/fatigue.   Problems:  Patient Active Problem List   Diagnosis Date Noted   Ischial bursitis of left side 10/19/2020   Essential hypertension 08/10/2020   Dyslipidemia 08/10/2020   Seizure disorder ([place]) 08/10/2020   Pain in left shoulder 08/20/2019   Unilateral primary osteoarthritis, left knee 06/12/2017   Malignant neoplasm of overlapping  sites of right breast in female, estrogen receptor positive ([place]) 06/25/2014   History of breast cancer    Family history of ovarian cancer    Family history of breast cancer in female     Allergies:  Allergies  Allergen Reactions   Iohexol      Desc: pt states in '80's wheezing; dyspnea w/ contrast--pt needs pre meds in future; slg 04/24/2008    Sulfa Antibiotics    Medications:  Current Outpatient Medications:    albuterol (PROAIR HFA) 108 (90 Base) MCG/ACT inhaler, Inhale 2 puffs into the lungs every 6 (six) hours as needed., Disp: 18 g, Rfl: 3    atorvastatin (LIPITOR) 10 MG tablet, TAKE 1 TABLET DAILY AT 6PM, Disp: 90 tablet, Rfl: 3   calcium citrate-vitamin D (CITRACAL+D) 315-200 MG-UNIT per tablet, Take 1 tablet by mouth 2 (two) times daily., Disp: , Rfl:    cetirizine (ZYRTEC) 10 MG tablet, Take 10 mg by mouth daily as needed. , Disp: , Rfl:    fluticasone (FLONASE) 50 MCG/ACT nasal spray, USE 2 SPRAYS IN EACH       NOSTRIL DAILY AS NEEDED, Disp: 16 g, Rfl: 0   metFORMIN (GLUCOPHAGE-XR) 500 MG 24 hr tablet, TAKE 1 TABLET BY MOUTH EVERY DAY WITH BREAKFAST, Disp: 90 tablet, Rfl: 3   Multiple Vitamin (MULTIVITAMIN) capsule, Take 1 capsule by mouth daily., Disp: , Rfl:    olmesartan-hydrochlorothiazide (BENICAR HCT) 20-12.5 MG tablet, Take 1 tablet by mouth daily., Disp: 90 tablet, Rfl: 3   phenytoin (DILANTIN) 100 MG ER capsule, TAKE 3 CAPSULES (300MG      TOTAL) AT BEDTIME, Disp: 180 capsule, Rfl: 1  Observations/Objective: Patient is well-developed, well-nourished in no acute distress.  Resting comfortably  at home.  Head is normocephalic, atraumatic.  No labored breathing.  Speech is clear and coherent with logical content.  Patient is alert and oriented at baseline.  No SOB or current chest pain  Assessment and Plan: 1. Chest pain, unspecified type  2. Essential hypertension  3. Type 2 diabetes mellitus with other specified complication, without long-term current use of insulin ([place])  4. Hyperlipidemia associated with type 2 diabetes mellitus ([place])  PT has multiple risks factors for CAD. However, given pain only lasted for 2-3 seconds and has resolved I recommend her call her PCP office in AM. She will need to see a Cardiologists to rule out blockage. If she has any chest pain or SOB go to ED.   Follow Up Instructions: I discussed the assessment and treatment plan with the patient. The patient was provided an opportunity to ask questions and all were answered. The patient agreed with the plan and demonstrated an  understanding of the instructions.  A copy of instructions were sent to the patient via MyChart unless otherwise noted below.     The patient was advised to call back or seek an in-person evaluation if the symptoms worsen or if the condition fails to improve as anticipated.  Time:  I spent 22 minutes with the patient via telehealth technology discussing the above problems/concerns.    Evelina Dun, FNP

## 2021-08-11 ENCOUNTER — Emergency Department (HOSPITAL_BASED_OUTPATIENT_CLINIC_OR_DEPARTMENT_OTHER)
Admission: EM | Admit: 2021-08-11 | Discharge: 2021-08-11 | Disposition: A | Discharge status: Home / Self Care | Payer: BC Managed Care – PPO | Attending: Emergency Medicine | Admitting: Emergency Medicine

## 2021-08-11 ENCOUNTER — Encounter: Payer: Self-pay | Admitting: Emergency Medicine

## 2021-08-11 ENCOUNTER — Other Ambulatory Visit: Payer: Self-pay

## 2021-08-11 ENCOUNTER — Emergency Department (HOSPITAL_BASED_OUTPATIENT_CLINIC_OR_DEPARTMENT_OTHER): Payer: BC Managed Care – PPO

## 2021-08-11 ENCOUNTER — Ambulatory Visit
Admission: EM | Admit: 2021-08-11 | Discharge: 2021-08-11 | Disposition: A | Discharge status: Home / Self Care | Payer: BC Managed Care – PPO | Attending: Emergency Medicine | Admitting: Emergency Medicine

## 2021-08-11 ENCOUNTER — Encounter (HOSPITAL_BASED_OUTPATIENT_CLINIC_OR_DEPARTMENT_OTHER): Payer: Self-pay

## 2021-08-11 ENCOUNTER — Emergency Department (HOSPITAL_BASED_OUTPATIENT_CLINIC_OR_DEPARTMENT_OTHER): Payer: BC Managed Care – PPO | Admitting: Radiology

## 2021-08-11 DIAGNOSIS — K224 Dyskinesia of esophagus: Secondary | ICD-10-CM | POA: Diagnosis not present

## 2021-08-11 DIAGNOSIS — I2119 ST elevation (STEMI) myocardial infarction involving other coronary artery of inferior wall: Secondary | ICD-10-CM

## 2021-08-11 DIAGNOSIS — K219 Gastro-esophageal reflux disease without esophagitis: Secondary | ICD-10-CM | POA: Diagnosis not present

## 2021-08-11 DIAGNOSIS — I10 Essential (primary) hypertension: Secondary | ICD-10-CM | POA: Diagnosis not present

## 2021-08-11 DIAGNOSIS — Z9660 Presence of unspecified orthopedic joint implant: Secondary | ICD-10-CM | POA: Diagnosis not present

## 2021-08-11 DIAGNOSIS — Z79899 Other long term (current) drug therapy: Secondary | ICD-10-CM | POA: Diagnosis not present

## 2021-08-11 DIAGNOSIS — Z853 Personal history of malignant neoplasm of breast: Secondary | ICD-10-CM | POA: Diagnosis not present

## 2021-08-11 DIAGNOSIS — Z923 Personal history of irradiation: Secondary | ICD-10-CM | POA: Diagnosis not present

## 2021-08-11 DIAGNOSIS — R9431 Abnormal electrocardiogram [ECG] [EKG]: Secondary | ICD-10-CM

## 2021-08-11 DIAGNOSIS — R0789 Other chest pain: Secondary | ICD-10-CM

## 2021-08-11 DIAGNOSIS — R079 Chest pain, unspecified: Secondary | ICD-10-CM | POA: Insufficient documentation

## 2021-08-11 DIAGNOSIS — I2129 ST elevation (STEMI) myocardial infarction involving other sites: Secondary | ICD-10-CM

## 2021-08-11 LAB — CBC
HCT: 44.4 % (ref 36.0–46.0)
Hemoglobin: 14.4 g/dL (ref 12.0–15.0)
MCH: 30.3 pg (ref 26.0–34.0)
MCHC: 32.4 g/dL (ref 30.0–36.0)
MCV: 93.5 fL (ref 80.0–100.0)
Platelets: 276 10*3/uL (ref 150–400)
RBC: 4.75 MIL/uL (ref 3.87–5.11)
RDW: 11.6 % (ref 11.5–15.5)
WBC: 9.4 10*3/uL (ref 4.0–10.5)
nRBC: 0 % (ref 0.0–0.2)

## 2021-08-11 LAB — BASIC METABOLIC PANEL
Anion gap: 9 (ref 5–15)
BUN: 19 mg/dL (ref 8–23)
CO2: 27 mmol/L (ref 22–32)
Calcium: 10 mg/dL (ref 8.9–10.3)
Chloride: 103 mmol/L (ref 98–111)
Creatinine, Ser: 0.85 mg/dL (ref 0.44–1.00)
GFR, Estimated: >60 mL/min (ref >60)
Glucose, Bld: 106 mg/dL — ABNORMAL HIGH (ref 70–99)
Potassium: 3.9 mmol/L (ref 3.5–5.1)
Sodium: 139 mmol/L (ref 135–145)

## 2021-08-11 LAB — TROPONIN I (HIGH SENSITIVITY)
Troponin I (High Sensitivity): 2 ng/L (ref <18)
Troponin I (High Sensitivity): 3 ng/L (ref <18)

## 2021-08-11 MED ORDER — LANSOPRAZOLE 30 MG PO CPDR: 30.0000 mg | DELAYED_RELEASE_CAPSULE | Freq: Every day | ORAL | 2 refills | Status: DC

## 2021-08-11 NOTE — ED Triage Notes (Addendum)
Patiet presents to [facility] for evaluation of nervousness starting this afternoon.  States she had an episode of stabbing chest pain twice in a few minute period on Monday.  Called today to review with a triage nurse who recommended follow-up, and an appointment for Monday, but then the episode of nervousness started this afternoon and she is concerned.  Denies n/v, denies jaw or back pain, SOB.  No weakness.Does state she is having knee pain, which she is "working on" but denies other pain at this time

## 2021-08-11 NOTE — ED Triage Notes (Signed)
Pt presents with 2 separate episode of mid-sternum stabbing CP on Monday. Pt also reports having a recent virtual visit for "feeling nervous" and CP episodes. [T went to UC today for ongoing symptoms. They sent her here for further evaluation

## 2021-08-11 NOTE — ED Provider Notes (Signed)
I provided a substantive portion of the care of this patient.  I personally performed the entirety of the medical decision making for this encounter.  EKG Interpretation  Date/Time:  Thursday August 11 2021 17:06:31 EST Ventricular Rate:  90 PR Interval:  182 QRS Duration: 94 QT Interval:  364 QTC Calculation: 445 R Axis:   -21 Text Interpretation: Normal sinus rhythm Inferior infarct , age undetermined Possible Anterolateral infarct , age undetermined Abnormal ECG No significant change since last tracing Confirmed by Lacretia Leigh [phone]) on 08/11/2021 7:63:54 PM   75 year old female presents from urgent care due to concern of her EKG.  Patient had chest pain several days ago.  Did not appear to be anginal as it was sharp.  Troponins here is normal.  EKG without ischemic changes.  Chest x-ray without acute findings.  Will discharge home   Lacretia Leigh, MD 08/11/21 2107

## 2021-08-11 NOTE — ED Provider Notes (Signed)
[facility] EMERGENCY DEPT Provider Note   CSN: [id number] Arrival date & time: 08/11/21  1658     History Chief Complaint  Patient presents with   Chest Pain    Shelly Simpson is a 75 y.o. female.  75 year old female presents today for evaluation of abnormal EKG from urgent care.  Patient reports she presented to the urgent care today because she had fleeting episode of sharp chest pain Monday and today she felt jittery and anxious.  She was evaluated at the urgent care and was believed that her symptoms are likely coming from acid reflux and was started on a new medication.  However given her complaint of chest pain they did an EKG which showed age-indeterminate inferior infarct for which they referred her to the emergency room.  She reports she has not had episodes of chest pain prior to Monday or since Monday and during the episode she denies having lightheadedness, nausea, diaphoresis, abdominal pain, or radiation of pain.  She reports this episode of sharp chest pain lasted seconds each time and occurred within a minute or 2.  She has history of hypertension and reports compliance with her home medications.  She also has history of seizure and is on Dilantin.  She believes she was erroneously diagnosed with seizures.  She states she does have acid reflux after she eats certain foods.  She reports she has a medication that she uses as needed which provides her with relief.  The history is provided by the patient. No language interpreter was used.      Past Medical History:  Diagnosis Date   Allergy    Breast cancer ([place])    Cancer ([place])    Family history of breast cancer in female    Family history of ovarian cancer    History of breast cancer    Hypertension    Seizures ([place])     Patient Active Problem List   Diagnosis Date Noted   Ischial bursitis of left side 10/19/2020   Essential hypertension 08/10/2020   Dyslipidemia 08/10/2020   Seizure disorder ([place])  08/10/2020   Pain in left shoulder 08/20/2019   Unilateral primary osteoarthritis, left knee 06/12/2017   Malignant neoplasm of overlapping sites of right breast in female, estrogen receptor positive ([place]) 06/25/2014   History of breast cancer    Family history of ovarian cancer    Family history of breast cancer in female     Past Surgical History:  Procedure Laterality Date   ABDOMINAL HYSTERECTOMY     BREAST LUMPECTOMY Right 2009   BREAST SURGERY     JOINT REPLACEMENT     SPINE SURGERY     laminectomy     OB History   No obstetric history on file.     Family History  Problem Relation Age of Onset   Ovarian cancer Mother 5       also uterine sarcoma   Other Father    Breast cancer Daughter 18   Breast cancer Maternal Aunt    Breast cancer Maternal Uncle        female breast cancer; deceased 69   Cancer Maternal Grandmother    Heart disease Maternal Grandfather    Hypertension Maternal Grandfather    Hypertension Paternal Grandfather    Breast cancer Cousin 41       Currently 41    Social History   Tobacco Use   Smoking status: Never   Smokeless tobacco: Never  Vaping Use  Vaping Use: Never used  Substance Use Topics   Alcohol use: No   Drug use: No    Home Medications Prior to Admission medications   Medication Sig Start Date End Date Taking? Authorizing Provider  albuterol (PROAIR HFA) 108 (90 Base) MCG/ACT inhaler Inhale 2 puffs into the lungs every 6 (six) hours as needed. 11/10/20   Horald Pollen, MD  atorvastatin (LIPITOR) 10 MG tablet TAKE 1 TABLET DAILY AT [facility] 11/10/20   Horald Pollen, MD  calcium citrate-vitamin D (CITRACAL+D) 315-200 MG-UNIT per tablet Take 1 tablet by mouth 2 (two) times daily.    [provider]  cetirizine (ZYRTEC) 10 MG tablet Take 10 mg by mouth daily as needed.     [provider]  fluticasone (FLONASE) 50 MCG/ACT nasal spray USE 2 SPRAYS IN EACH       NOSTRIL DAILY AS NEEDED 06/20/21    Horald Pollen, MD  lansoprazole (PREVACID) 30 MG capsule Take 1 capsule (30 mg total) by mouth daily with breakfast. 08/11/21 11/09/21  Lynden Oxford Scales, PA-C  metFORMIN (GLUCOPHAGE-XR) 500 MG 24 hr tablet TAKE 1 TABLET BY MOUTH EVERY DAY WITH BREAKFAST 11/10/20   Horald Pollen, MD  Multiple Vitamin (MULTIVITAMIN) capsule Take 1 capsule by mouth daily.    [provider]  olmesartan-hydrochlorothiazide (BENICAR HCT) 20-12.5 MG tablet Take 1 tablet by mouth daily. 11/10/20   Horald Pollen, MD  phenytoin (DILANTIN) 100 MG ER capsule TAKE 3 CAPSULES (300MG      TOTAL) AT BEDTIME 06/20/21   Horald Pollen, MD    Allergies    Iohexol and Sulfa antibiotics  Review of Systems   Review of Systems  Constitutional:  Negative for activity change, chills and fever.  Respiratory:  Negative for chest tightness and shortness of breath.   Cardiovascular:  Positive for chest pain. Negative for palpitations and leg swelling.  Gastrointestinal:  Negative for abdominal pain, nausea and vomiting.  Neurological:  Negative for weakness, light-headedness and headaches.  All other systems reviewed and are negative.  Physical Exam Updated Vital Signs BP 140/73 (BP Location: Left Arm)    Pulse 87    Temp 98.6 F (37 C) (Oral)    Resp 14    Ht 5\' 5"  (1.651 m)    Wt 101.6 kg    SpO2 96%    BMI 37.28 kg/m   Physical Exam Vitals and nursing note reviewed.  Constitutional:      General: She is not in acute distress.    Appearance: Normal appearance. She is not ill-appearing.  HENT:     Head: Normocephalic and atraumatic.     Nose: Nose normal.  Eyes:     General: No scleral icterus.    Extraocular Movements: Extraocular movements intact.     Conjunctiva/sclera: Conjunctivae normal.  Cardiovascular:     Rate and Rhythm: Normal rate and regular rhythm.     Pulses: Normal pulses.     Heart sounds: Normal heart sounds.  Pulmonary:     Effort: Pulmonary effort is  normal. No respiratory distress.     Breath sounds: Normal breath sounds. No wheezing or rales.  Abdominal:     General: There is no distension.     Tenderness: There is no abdominal tenderness.  Musculoskeletal:        General: Normal range of motion.     Cervical back: Normal range of motion.  Skin:    General: Skin is warm and dry.  Neurological:  General: No focal deficit present.     Mental Status: She is alert. Mental status is at baseline.    ED Results / Procedures / Treatments   Labs (all labs ordered are listed, but only abnormal results are displayed) Labs Reviewed  CBC  BASIC METABOLIC PANEL  TROPONIN I (HIGH SENSITIVITY)  TROPONIN I (HIGH SENSITIVITY)    EKG None  Radiology DG Chest Portable 1 View  Result Date: 08/11/2021 CLINICAL DATA:  Chest pain EXAM: PORTABLE CHEST 1 VIEW COMPARISON:  02/11/2013 FINDINGS: Cardiac size is within normal limits. Mediastinum is unremarkable. There are no signs of alveolar pulmonary edema or focal pulmonary consolidation. There is no pleural effusion or pneumothorax. IMPRESSION: There are no focal infiltrates or signs of pulmonary edema. Electronically Signed   By: Elmer Picker M.D.   On: 08/11/2021 17:50    Procedures Procedures   Medications Ordered in ED Medications - No data to display  ED Course  I have reviewed the triage vital signs and the nursing notes.  Pertinent labs & imaging results that were available during my care of the patient were reviewed by me and considered in my medical decision making (see chart for details).    MDM Rules/Calculators/A&P                         75 year old female presents today for evaluation from urgent care for evaluation of abnormal EKG.  Patient had fleeting episode of chest pain Monday which was a shooting pain center of her chest that lasted couple seconds and had 2 episodes within about a minute.  She denies previous history of CAD, prior history of chest pain or  episodes of chest pain since then.  She also did not have any associated symptoms during that episode.  Her heart score is 3.  EKG without acute ischemic changes.  Chest x-ray without acute cardiopulmonary process.  BMP unremarkable with exception of glucose 106, CBC unremarkable, troponin 2.  She does not need repeat troponin given her chest pain occurred Monday and has not had recurrent symptoms.  She is found to have symptoms consistent with GERD at urgent care and was started on treatment.  Her chest pain is atypical and unlikely to be cardiac in nature.  Patient will follow-up with her primary care for further work-up and management.  Return precautions discussed.  This has been discussed with patient and her husband who is at bedside they will voiced understanding and are in agreement with plan.  I discussed this case with my attending physician who cosigned this note including patient's presenting symptoms, physical exam, and planned diagnostics and interventions.  Attending physician assessed patient at bedside.       Final Clinical Impression(s) / ED Diagnoses Final diagnoses:  None    Rx / DC Orders ED Discharge Orders     None        Evlyn Courier, Hershal Coria 08/11/21 2119    Lacretia Leigh, MD 08/12/21 [phone]

## 2021-08-11 NOTE — Discharge Instructions (Signed)
Please go to the emergency room now for further evaluation of EKG findings.

## 2021-08-11 NOTE — Discharge Instructions (Addendum)
He had an abnormal EKG at the urgent care for which they sent you to the emergency room for.  He had work-up today including EKG, chest x-ray, blood work to assess the heart.  Your work-up today was reassuring.  You can continue taking the medication urgent care sent in to the pharmacy for you.  Also recommend you follow-up with your primary care provider for further work-up and management.  If you have recurrence of your chest pain, develop shortness of breath please return to the emergency room.

## 2021-08-11 NOTE — ED Provider Notes (Signed)
[facility]    CSN: [id number] Arrival date & time: 08/11/21  1535    HISTORY   Chief Complaint  Patient presents with   Anxiety   HPI Shelly Simpson is a 75 y.o. female. Days ago, in the afternoon, she had 2 episodes of a stabbing chest pain immediately left of sternum that lasted approximately a few seconds then resolved without intervention.  Patient states she has made several phone calls to reach out to her providers and to set up an appointment but did not have much success initially, states she did receive an email this afternoon and believes she now has an appointment scheduled for Monday, patient mentioned that she found this a little frustrating given her age and her past medical history.    Patient reports a history of heartburn, states she takes Gaviscon as needed, does not feel like she has had significant increase of her symptoms recently.  Patient denies significant past medical history relative to heart disease.  Patient states she does take the high blood pressure medications.  Patient reports a very remote history of seizure, about 40 years, states she feels it was related to the amount of stress she was under at the time, states she has been maintained on Dilantin ever since, has never had another seizure since the initial episode.    Patient is here with her husband today who states he was concerned about her having an episode of nervousness and shaking earlier this afternoon after completing her shower.  Patient states she was done washing herself and turning off the water but when she reached out to do so, noticed that her hands were shaking severely and she was unable to control them.  Patient states she did not lose consciousness nor did she feel confused at the time.  Patient states this episode was not in any way related to her previous experience of having a seizure.  Husband states she is never had "the shakes" like this before.  Patient denies any recent  changes to her medication regimen.  The history is provided by the patient.  Past Medical History:  Diagnosis Date   Allergy    Breast cancer ([place])    Cancer ([place])    Family history of breast cancer in female    Family history of ovarian cancer    History of breast cancer    Hypertension    Seizures ([place])    Patient Active Problem List   Diagnosis Date Noted   Ischial bursitis of left side 10/19/2020   Essential hypertension 08/10/2020   Dyslipidemia 08/10/2020   Seizure disorder ([place]) 08/10/2020   Pain in left shoulder 08/20/2019   Unilateral primary osteoarthritis, left knee 06/12/2017   Malignant neoplasm of overlapping sites of right breast in female, estrogen receptor positive ([place]) 06/25/2014   History of breast cancer    Family history of ovarian cancer    Family history of breast cancer in female    Past Surgical History:  Procedure Laterality Date   ABDOMINAL HYSTERECTOMY     BREAST LUMPECTOMY Right 2009   BREAST SURGERY     JOINT REPLACEMENT     SPINE SURGERY     laminectomy   OB History   No obstetric history on file.    Home Medications    Prior to Admission medications   Medication Sig Start Date End Date Taking? Authorizing Provider  cetirizine (ZYRTEC) 10 MG tablet Take 10 mg by mouth daily as needed.  Yes [provider]  albuterol (PROAIR HFA) 108 (90 Base) MCG/ACT inhaler Inhale 2 puffs into the lungs every 6 (six) hours as needed. 11/10/20   Horald Pollen, MD  atorvastatin (LIPITOR) 10 MG tablet TAKE 1 TABLET DAILY AT [facility] 11/10/20   Horald Pollen, MD  calcium citrate-vitamin D (CITRACAL+D) 315-200 MG-UNIT per tablet Take 1 tablet by mouth 2 (two) times daily.    [provider]  fluticasone (FLONASE) 50 MCG/ACT nasal spray USE 2 SPRAYS IN EACH       NOSTRIL DAILY AS NEEDED 06/20/21   Horald Pollen, MD  metFORMIN (GLUCOPHAGE-XR) 500 MG 24 hr tablet TAKE 1 TABLET BY MOUTH EVERY DAY WITH BREAKFAST 11/10/20    Horald Pollen, MD  Multiple Vitamin (MULTIVITAMIN) capsule Take 1 capsule by mouth daily.    [provider]  olmesartan-hydrochlorothiazide (BENICAR HCT) 20-12.5 MG tablet Take 1 tablet by mouth daily. 11/10/20   Horald Pollen, MD  phenytoin (DILANTIN) 100 MG ER capsule TAKE 3 CAPSULES (300MG      TOTAL) AT BEDTIME 06/20/21   Horald Pollen, MD    Family History Family History  Problem Relation Age of Onset   Ovarian cancer Mother 31       also uterine sarcoma   Other Father    Breast cancer Daughter 84   Breast cancer Maternal Aunt    Breast cancer Maternal Uncle        female breast cancer; deceased 32   Cancer Maternal Grandmother    Heart disease Maternal Grandfather    Hypertension Maternal Grandfather    Hypertension Paternal Grandfather    Breast cancer Cousin 41       Currently 57   Social History Social History   Tobacco Use   Smoking status: Never   Smokeless tobacco: Never  Vaping Use   Vaping Use: Never used  Substance Use Topics   Alcohol use: No   Drug use: No   Allergies   Iohexol and Sulfa antibiotics  Review of Systems Review of Systems Pertinent findings noted in history of present illness.   Physical Exam Triage Vital Signs ED Triage Vitals  Enc Vitals Group     BP 06/24/21 0827 (!) 147/82     Pulse Rate 06/24/21 0827 72     Resp 06/24/21 0827 18     Temp 06/24/21 0827 98.3 F (36.8 C)     Temp Source 06/24/21 0827 Oral     SpO2 06/24/21 0827 98 %     Weight --      Height --      Head Circumference --      Peak Flow --      Pain Score 06/24/21 0826 5     Pain Loc --      Pain Edu? --      Excl. in [place]? --   No data found.  Updated Vital Signs BP 130/81 (BP Location: Left Arm)    Pulse 94    Temp 98.7 F (37.1 C) (Oral)    Resp 18    SpO2 97%   Physical Exam Vitals and nursing note reviewed.  Constitutional:      General: She is not in acute distress.    Appearance: Normal appearance. She is not  ill-appearing.  HENT:     Head: Normocephalic and atraumatic.  Eyes:     General: Lids are normal.        Right eye: No discharge.  Left eye: No discharge.     Extraocular Movements: Extraocular movements intact.     Conjunctiva/sclera: Conjunctivae normal.     Right eye: Right conjunctiva is not injected.     Left eye: Left conjunctiva is not injected.  Neck:     Trachea: Trachea and phonation normal.  Cardiovascular:     Rate and Rhythm: Normal rate and regular rhythm.     Pulses: Normal pulses.     Heart sounds: Normal heart sounds. No murmur heard.   No friction rub. No gallop.  Pulmonary:     Effort: Pulmonary effort is normal. No accessory muscle usage, prolonged expiration or respiratory distress.     Breath sounds: Normal breath sounds. No stridor, decreased air movement or transmitted upper airway sounds. No decreased breath sounds, wheezing, rhonchi or rales.  Chest:     Chest wall: No tenderness.  Abdominal:     General: Abdomen is flat. Bowel sounds are normal. There is no distension.     Palpations: Abdomen is soft.     Tenderness: There is no abdominal tenderness. There is no right CVA tenderness or left CVA tenderness.     Hernia: No hernia is present.  Musculoskeletal:        General: Normal range of motion.     Cervical back: Normal range of motion and neck supple. Normal range of motion.  Lymphadenopathy:     Cervical: No cervical adenopathy.  Skin:    General: Skin is warm and dry.     Findings: No erythema or rash.  Neurological:     General: No focal deficit present.     Mental Status: She is alert and oriented to person, place, and time.  Psychiatric:        Attention and Perception: Attention and perception normal.        Mood and Affect: Affect normal. Mood is anxious.        Speech: Speech normal.        Behavior: Behavior normal.        Thought Content: Thought content normal.        Cognition and Memory: Cognition and memory normal.         Judgment: Judgment normal.    Visual Acuity Right Eye Distance:   Left Eye Distance:   Bilateral Distance:    Right Eye Near:   Left Eye Near:    Bilateral Near:     UC Couse / Diagnostics / Procedures:    EKG  Radiology No results found.  Procedures Procedures (including critical care time)  UC Diagnoses / Final Clinical Impressions(s)   I have reviewed the triage vital signs and the nursing notes.  Pertinent labs & imaging results that were available during my care of the patient were reviewed by me and considered in my medical decision making (see chart for details).    Final diagnoses:  Gastroesophageal reflux disease, unspecified whether esophagitis present  Esophageal spasm  History of radiation therapy  Localized chest pain  Myocardial infarction (lateral wall) (HCC)  Myocardial infarction, inferior wall ([place])   I believe patient experienced esophageal spasm, she also has a history of breast cancer and was treated with radiation to the chest on her left side, this may or may not be related to esophageal spasm as well.  Patient's blood pressure is normal on arrival, heart rate is normal, cardiac sounds are normal.  Patient does appear nervous per my observation.  Given patient's history of gastroesophageal reflux disease for  which she takes Gaviscon, I have advised her that it would be a good idea to begin a PPI for a week or 2 to see if this improves her symptoms of GERD as it is common for patients who experience lifelong GERD do not really be aware when their symptoms are worsening.  I provided her with a prescription of Prevacid.  EKG was performed today which demonstrated history of lateral infarct and inferior infarct, age undetermined.  For this reason, I strongly recommended that she and her husband to the emergency room now for evaluation of troponins, further cardiac testing is recommended by the emergency room provider.  Patient's husband states he plans to go to  the [place] location now.  ED Prescriptions     Medication Sig Dispense Auth. Provider   lansoprazole (PREVACID) 30 MG capsule Take 1 capsule (30 mg total) by mouth daily with breakfast. 30 capsule Lynden Oxford Scales, PA-C      PDMP not reviewed this encounter.  Pending results:  Labs Reviewed - No data to display  Medications Ordered in UC: Medications - No data to display  Disposition Upon Discharge:  Condition: stable for discharge home Home: take medications as prescribed; routine discharge instructions as discussed; follow up as advised.  Patient presented with an acute illness with associated systemic symptoms and significant discomfort requiring urgent management. In my opinion, this is a condition that a prudent lay person (someone who possesses an average knowledge of health and medicine) may potentially expect to result in complications if not addressed urgently such as respiratory distress, impairment of bodily function or dysfunction of bodily organs.   Routine symptom specific, illness specific and/or disease specific instructions were discussed with the patient and/or caregiver at length.   As such, the patient has been evaluated and assessed, work-up was performed and treatment was provided in alignment with urgent care protocols and evidence based medicine.  Patient/parent/caregiver has been advised that the patient may require follow up for further testing and treatment if the symptoms continue in spite of treatment, as clinically indicated and appropriate.  If the patient was tested for COVID-19, Influenza and/or RSV, then the patient/parent/guardian was advised to isolate at home pending the results of his/her diagnostic coronavirus test and potentially longer if theyre positive. I have also advised pt that if his/her COVID-19 test returns positive, it's recommended to self-isolate for at least 10 days after symptoms first appeared AND until fever-free  for 24 hours without fever reducer AND other symptoms have improved or resolved. Discussed self-isolation recommendations as well as instructions for household member/close contacts as per the [facility] and [place] DHHS, and also gave patient the [place] packet with this information.  Patient/parent/caregiver has been advised to return to the [facility] or PCP in 3-5 days if no better; to PCP or the Emergency Department if new signs and symptoms develop, or if the current signs or symptoms continue to change or worsen for further workup, evaluation and treatment as clinically indicated and appropriate  The patient will follow up with their current PCP if and as advised. If the patient does not currently have a PCP we will assist them in obtaining one.   The patient may need specialty follow up if the symptoms continue, in spite of conservative treatment and management, for further workup, evaluation, consultation and treatment as clinically indicated and appropriate.   Patient/parent/caregiver verbalized understanding and agreement of plan as discussed.  All questions were addressed during visit.  Please see discharge instructions below  for further details of plan.  Discharge Instructions:   Discharge Instructions      Please go to the emergency room now for further evaluation of EKG findings.      This office note has been dictated using Museum/gallery curator.  Unfortunately, and despite my best efforts, this method of dictation can sometimes lead to occasional typographical or grammatical errors.  I apologize in advance if this occurs.     Lynden Oxford Scales, PA-C 08/11/21 1649

## 2021-08-11 NOTE — ED Notes (Signed)
Patient is being discharged from the Urgent Care and sent to the Emergency Department via POA with family. Per L. Blanchie Serve, patient is in need of higher level of care due to abnormal EKG. Patient is aware and verbalizes understanding of plan of care.  Vitals:   08/11/21 1610  BP: 130/81  Pulse: 94  Resp: 18  Temp: 98.7 F (37.1 C)  SpO2: 97%

## 2021-08-11 NOTE — ED Notes (Signed)
RN provided AVS using Teachback Method. Patient verbalizes understanding of Discharge Instructions. Opportunity for Questioning and Answers were provided by RN. Patient Discharged from ED ambulatory to Home with Family. ? ?

## 2021-08-15 ENCOUNTER — Ambulatory Visit (INDEPENDENT_AMBULATORY_CARE_PROVIDER_SITE_OTHER): Payer: BC Managed Care – PPO | Admitting: Emergency Medicine

## 2021-08-15 ENCOUNTER — Encounter: Payer: Self-pay | Admitting: Emergency Medicine

## 2021-08-15 ENCOUNTER — Other Ambulatory Visit: Payer: Self-pay

## 2021-08-15 VITALS — BP 134/70 | HR 85 | Temp 98.0°F | Resp 16

## 2021-08-15 DIAGNOSIS — I10 Essential (primary) hypertension: Secondary | ICD-10-CM | POA: Diagnosis not present

## 2021-08-15 DIAGNOSIS — E785 Hyperlipidemia, unspecified: Secondary | ICD-10-CM

## 2021-08-15 DIAGNOSIS — R9431 Abnormal electrocardiogram [ECG] [EKG]: Secondary | ICD-10-CM

## 2021-08-15 DIAGNOSIS — M25562 Pain in left knee: Secondary | ICD-10-CM

## 2021-08-15 DIAGNOSIS — G8929 Other chronic pain: Secondary | ICD-10-CM | POA: Insufficient documentation

## 2021-08-15 NOTE — Assessment & Plan Note (Signed)
Needs orthopedic evaluation. Referral placed today. 

## 2021-08-15 NOTE — Progress Notes (Signed)
Shelly Simpson 75 y.o.   Chief Complaint  Patient presents with   Hospitalization Follow-up    Chest pain, pt feels fine today, would like to discuss medications given in ED   Hypertension    HISTORY OF PRESENT ILLNESS: This is a 75 y.o. female here for emergency department visit follow-up. Patient presented to urgent care center on 08/11/2021 complaining of chest pains.  Abnormal EKG.  Was sent to the emergency department where she was evaluated and sent home the same day. Emergency department assessment and plan as follows: EKG Interpretation   Date/Time:                  Thursday August 11 2021 17:06:31 EST Ventricular Rate:         90 PR Interval:                 182 QRS Duration: 94 QT Interval:                 364 QTC Calculation:        445 R Axis:                         -21 Text Interpretation:      Normal sinus rhythm Inferior infarct , age undetermined Possible Anterolateral infarct , age undetermined Abnormal ECG No significant change since last tracing Confirmed by Lacretia Leigh [phone]) on 08/11/2021 7:59:91 PM    75 year old female presents from urgent care due to concern of her EKG.  Patient had chest pain several days ago.  Did not appear to be anginal as it was sharp.  Troponins here is normal.  EKG without ischemic changes.  Chest x-ray without acute findings.  Will discharge home   Lacretia Leigh, MD 08/11/21 2107  Has not had any more chest pain episodes during the past several days.  Asymptomatic.  Stable. Today she has no complaints or any other medical concerns. EKG done in the emergency department reviewed.  Raises possibility of old MI.  Patient has no recollection of prior heart attacks. She also has chronic left knee pain. She was also prescribed Prevacid but she never started taking it afraid of possible kidney damage.  Hypertension Pertinent negatives include no chest pain, headaches, palpitations or shortness of breath.    Prior to Admission  medications   Medication Sig Start Date End Date Taking? Authorizing Provider  albuterol (PROAIR HFA) 108 (90 Base) MCG/ACT inhaler Inhale 2 puffs into the lungs every 6 (six) hours as needed. 11/10/20  Yes Derisha Funderburke, Ines Bloomer, MD  Alum Hydroxide-Mag Carbonate (GAVISCON PO) Take by mouth.   Yes [provider]  atorvastatin (LIPITOR) 10 MG tablet TAKE 1 TABLET DAILY AT 6PM 11/10/20  Yes Alinna Siple, Ines Bloomer, MD  calcium citrate-vitamin D (CITRACAL+D) 315-200 MG-UNIT per tablet Take 1 tablet by mouth 2 (two) times daily.   Yes [provider]  cetirizine (ZYRTEC) 10 MG tablet Take 10 mg by mouth daily as needed.    Yes [provider]  fluticasone (FLONASE) 50 MCG/ACT nasal spray USE 2 SPRAYS IN EACH       NOSTRIL DAILY AS NEEDED 06/20/21  Yes Lucus Lambertson, Ines Bloomer, MD  metFORMIN (GLUCOPHAGE-XR) 500 MG 24 hr tablet TAKE 1 TABLET BY MOUTH EVERY DAY WITH BREAKFAST 11/10/20  Yes Dory Demont, Ines Bloomer, MD  Multiple Vitamin (MULTIVITAMIN) capsule Take 1 capsule by mouth daily.   Yes [provider]  olmesartan-hydrochlorothiazide (BENICAR HCT) 20-12.5 MG  tablet Take 1 tablet by mouth daily. 11/10/20  Yes Horald Pollen, MD  phenytoin (DILANTIN) 100 MG ER capsule TAKE 3 CAPSULES (300MG      TOTAL) AT BEDTIME 06/20/21  Yes Betsabe Iglesia, Ines Bloomer, MD  lansoprazole (PREVACID) 30 MG capsule Take 1 capsule (30 mg total) by mouth daily with breakfast. Patient not taking: Reported on 08/15/2021 08/11/21 11/09/21  Lynden Oxford Scales, PA-C    Allergies  Allergen Reactions   Iohexol      Desc: pt states in '80's wheezing; dyspnea w/ contrast--pt needs pre meds in future; slg 04/24/2008    Sulfa Antibiotics     Patient Active Problem List   Diagnosis Date Noted   Ischial bursitis of left side 10/19/2020   Essential hypertension 08/10/2020   Dyslipidemia 08/10/2020   Seizure disorder ([place]) 08/10/2020   Pain in left shoulder 08/20/2019   Unilateral primary  osteoarthritis, left knee 06/12/2017   Malignant neoplasm of overlapping sites of right breast in female, estrogen receptor positive ([place]) 06/25/2014   History of breast cancer    Family history of ovarian cancer    Family history of breast cancer in female     Past Medical History:  Diagnosis Date   Allergy    Breast cancer ([place])    Cancer ([place])    Family history of breast cancer in female    Family history of ovarian cancer    History of breast cancer    Hypertension    Seizures ([place])     Past Surgical History:  Procedure Laterality Date   ABDOMINAL HYSTERECTOMY     BREAST LUMPECTOMY Right 2009   BREAST SURGERY     JOINT REPLACEMENT     SPINE SURGERY     laminectomy    Social History   Socioeconomic History   Marital status: Married    Spouse name: Not on file   Number of children: 2   Years of education: Not on file   Highest education level: Not on file  Occupational History   Not on file  Tobacco Use   Smoking status: Never   Smokeless tobacco: Never  Vaping Use   Vaping Use: Never used  Substance and Sexual Activity   Alcohol use: No   Drug use: No   Sexual activity: Not on file  Other Topics Concern   Not on file  Social History Narrative   Not on file   Social Determinants of Health   Financial Resource Strain: Not on file  Food Insecurity: Not on file  Transportation Needs: Not on file  Physical Activity: Not on file  Stress: Not on file  Social Connections: Not on file  Intimate Partner Violence: Not on file    Family History  Problem Relation Age of Onset   Ovarian cancer Mother 7       also uterine sarcoma   Other Father    Breast cancer Daughter 63   Breast cancer Maternal Aunt    Breast cancer Maternal Uncle        female breast cancer; deceased 63   Cancer Maternal Grandmother    Heart disease Maternal Grandfather    Hypertension Maternal Grandfather    Hypertension Paternal Grandfather    Breast cancer Cousin 67       Currently  38     Review of Systems  Constitutional: Negative.  Negative for chills and fever.  HENT: Negative.  Negative for congestion and sore throat.   Respiratory: Negative.  Negative for cough  and shortness of breath.   Cardiovascular: Negative.  Negative for chest pain and palpitations.  Gastrointestinal: Negative.  Negative for abdominal pain, diarrhea, nausea and vomiting.  Genitourinary: Negative.  Negative for dysuria and hematuria.  Skin: Negative.  Negative for rash.  Neurological:  Negative for dizziness and headaches.  All other systems reviewed and are negative.  Today's Vitals   08/15/21 1153  BP: 134/70  Pulse: 85  Resp: 16  Temp: 98 F (36.7 C)  SpO2: 95%   There is no height or weight on file to calculate BMI.   Physical Exam Vitals reviewed.  Constitutional:      Appearance: Normal appearance.  HENT:     Head: Normocephalic.  Eyes:     Extraocular Movements: Extraocular movements intact.     Pupils: Pupils are equal, round, and reactive to light.  Cardiovascular:     Rate and Rhythm: Normal rate and regular rhythm.     Pulses: Normal pulses.     Heart sounds: Normal heart sounds.  Pulmonary:     Effort: Pulmonary effort is normal.     Breath sounds: Normal breath sounds.  Musculoskeletal:     Cervical back: Normal range of motion and neck supple.     Right lower leg: No edema.     Left lower leg: No edema.  Skin:    General: Skin is warm and dry.     Capillary Refill: Capillary refill takes less than 2 seconds.  Neurological:     General: No focal deficit present.     Mental Status: She is alert and oriented to person, place, and time.  Psychiatric:        Mood and Affect: Mood normal.        Behavior: Behavior normal.     ASSESSMENT & PLAN: A total of 45 minutes was spent with the patient and counseling/coordination of care regarding preparing for this visit, review of most recent emergency department visit notes, review of most recent EKGs,  review of most recent chest x-ray, review of most recent blood work results, review of all medications, cardiac risk assessment, need for cardiology evaluation and echocardiogram, education on nutrition, review of all medications, chronic left knee pain and need for orthopedic evaluation, prognosis, documentation and need for follow-up.  Problem List Items Addressed This Visit       Cardiovascular and Mediastinum   Essential hypertension - Primary    Well-controlled hypertension. Continue Benicar-HCTZ 20--12.5 mg daily. Dietary approach to stop hypertension discussed.        Other   Dyslipidemia    Diet and nutrition discussed.  Continue atorvastatin 10 mg daily.       Chronic pain of left knee    Needs orthopedic evaluation.  Referral placed today.      Relevant Orders   Ambulatory referral to Orthopedic Surgery   Abnormal EKG    Recent episodes of chest pain.  No signs of MI. Abnormal EKG raising possibility of old infarct. Needs cardiology evaluation and echocardiogram. Referral placed today.      Relevant Orders   Ambulatory referral to Cardiology   Patient Instructions  Health Maintenance After Age 35 After age 63, you are at a higher risk for certain long-term diseases and infections as well as injuries from falls. Falls are a major cause of broken bones and head injuries in people who are older than age 64. Getting regular preventive care can help to keep you healthy and well. Preventive care includes getting regular  testing and making lifestyle changes as recommended by your health care provider. Talk with your health care provider about: Which screenings and tests you should have. A screening is a test that checks for a disease when you have no symptoms. A diet and exercise plan that is right for you. What should I know about screenings and tests to prevent falls? Screening and testing are the best ways to find a health problem early. Early diagnosis and treatment  give you the best chance of managing medical conditions that are common after age 70. Certain conditions and lifestyle choices may make you more likely to have a fall. Your health care provider may recommend: Regular vision checks. Poor vision and conditions such as cataracts can make you more likely to have a fall. If you wear glasses, make sure to get your prescription updated if your vision changes. Medicine review. Work with your health care provider to regularly review all of the medicines you are taking, including over-the-counter medicines. Ask your health care provider about any side effects that may make you more likely to have a fall. Tell your health care provider if any medicines that you take make you feel dizzy or sleepy. Strength and balance checks. Your health care provider may recommend certain tests to check your strength and balance while standing, walking, or changing positions. Foot health exam. Foot pain and numbness, as well as not wearing proper footwear, can make you more likely to have a fall. Screenings, including: Osteoporosis screening. Osteoporosis is a condition that causes the bones to get weaker and break more easily. Blood pressure screening. Blood pressure changes and medicines to control blood pressure can make you feel dizzy. Depression screening. You may be more likely to have a fall if you have a fear of falling, feel depressed, or feel unable to do activities that you used to do. Alcohol use screening. Using too much alcohol can affect your balance and may make you more likely to have a fall. Follow these instructions at home: Lifestyle Do not drink alcohol if: Your health care provider tells you not to drink. If you drink alcohol: Limit how much you have to: 0-1 drink a day for women. 0-2 drinks a day for men. Know how much alcohol is in your drink. In the [place] one drink equals one 12 oz bottle of beer (355 mL), one 5 oz glass of wine (148 mL), or one 1 oz  glass of hard liquor (44 mL). Do not use any products that contain nicotine or tobacco. These products include cigarettes, chewing tobacco, and vaping devices, such as e-cigarettes. If you need help quitting, ask your health care provider. Activity  Follow a regular exercise program to stay fit. This will help you maintain your balance. Ask your health care provider what types of exercise are appropriate for you. If you need a cane or walker, use it as recommended by your health care provider. Wear supportive shoes that have nonskid soles. Safety  Remove any tripping hazards, such as rugs, cords, and clutter. Install safety equipment such as grab bars in bathrooms and safety rails on stairs. Keep rooms and walkways well-lit. General instructions Talk with your health care provider about your risks for falling. Tell your health care provider if: You fall. Be sure to tell your health care provider about all falls, even ones that seem minor. You feel dizzy, tiredness (fatigue), or off-balance. Take over-the-counter and prescription medicines only as told by your health care provider. These include supplements.  Eat a healthy diet and maintain a healthy weight. A healthy diet includes low-fat dairy products, low-fat (lean) meats, and fiber from whole grains, beans, and lots of fruits and vegetables. Stay current with your vaccines. Schedule regular health, dental, and eye exams. Summary Having a healthy lifestyle and getting preventive care can help to protect your health and wellness after age 24. Screening and testing are the best way to find a health problem early and help you avoid having a fall. Early diagnosis and treatment give you the best chance for managing medical conditions that are more common for people who are older than age 23. Falls are a major cause of broken bones and head injuries in people who are older than age 1. Take precautions to prevent a fall at home. Work with your  health care provider to learn what changes you can make to improve your health and wellness and to prevent falls. This information is not intended to replace advice given to you by your health care provider. Make sure you discuss any questions you have with your health care provider. Document Revised: 01/03/2021 Document Reviewed: 01/03/2021 Elsevier Patient Education  2022 [place], MD [facility]

## 2021-08-15 NOTE — Patient Instructions (Signed)
Health Maintenance After Age 73 After age 73, you are at a higher risk for certain long-term diseases and infections as well as injuries from falls. Falls are a major cause of broken bones and head injuries in people who are older than age 73. Getting regular preventive care can help to keep you healthy and well. Preventive care includes getting regular testing and making lifestyle changes as recommended by your health care provider. Talk with your health care provider about: Which screenings and tests you should have. A screening is a test that checks for a disease when you have no symptoms. A diet and exercise plan that is right for you. What should I know about screenings and tests to prevent falls? Screening and testing are the best ways to find a health problem early. Early diagnosis and treatment give you the best chance of managing medical conditions that are common after age 73. Certain conditions and lifestyle choices may make you more likely to have a fall. Your health care provider may recommend: Regular vision checks. Poor vision and conditions such as cataracts can make you more likely to have a fall. If you wear glasses, make sure to get your prescription updated if your vision changes. Medicine review. Work with your health care provider to regularly review all of the medicines you are taking, including over-the-counter medicines. Ask your health care provider about any side effects that may make you more likely to have a fall. Tell your health care provider if any medicines that you take make you feel dizzy or sleepy. Strength and balance checks. Your health care provider may recommend certain tests to check your strength and balance while standing, walking, or changing positions. Foot health exam. Foot pain and numbness, as well as not wearing proper footwear, can make you more likely to have a fall. Screenings, including: Osteoporosis screening. Osteoporosis is a condition that causes  the bones to get weaker and break more easily. Blood pressure screening. Blood pressure changes and medicines to control blood pressure can make you feel dizzy. Depression screening. You may be more likely to have a fall if you have a fear of falling, feel depressed, or feel unable to do activities that you used to do. Alcohol use screening. Using too much alcohol can affect your balance and may make you more likely to have a fall. Follow these instructions at home: Lifestyle Do not drink alcohol if: Your health care provider tells you not to drink. If you drink alcohol: Limit how much you have to: 0-1 drink a day for women. 0-2 drinks a day for men. Know how much alcohol is in your drink. In the [place] one drink equals one 12 oz bottle of beer (355 mL), one 5 oz glass of wine (148 mL), or one 1 oz glass of hard liquor (44 mL). Do not use any products that contain nicotine or tobacco. These products include cigarettes, chewing tobacco, and vaping devices, such as e-cigarettes. If you need help quitting, ask your health care provider. Activity  Follow a regular exercise program to stay fit. This will help you maintain your balance. Ask your health care provider what types of exercise are appropriate for you. If you need a cane or walker, use it as recommended by your health care provider. Wear supportive shoes that have nonskid soles. Safety  Remove any tripping hazards, such as rugs, cords, and clutter. Install safety equipment such as grab bars in bathrooms and safety rails on stairs. Keep rooms and walkways   well-lit. General instructions Talk with your health care provider about your risks for falling. Tell your health care provider if: You fall. Be sure to tell your health care provider about all falls, even ones that seem minor. You feel dizzy, tiredness (fatigue), or off-balance. Take over-the-counter and prescription medicines only as told by your health care provider. These include  supplements. Eat a healthy diet and maintain a healthy weight. A healthy diet includes low-fat dairy products, low-fat (lean) meats, and fiber from whole grains, beans, and lots of fruits and vegetables. Stay current with your vaccines. Schedule regular health, dental, and eye exams. Summary Having a healthy lifestyle and getting preventive care can help to protect your health and wellness after age 73. Screening and testing are the best way to find a health problem early and help you avoid having a fall. Early diagnosis and treatment give you the best chance for managing medical conditions that are more common for people who are older than age 73. Falls are a major cause of broken bones and head injuries in people who are older than age 73. Take precautions to prevent a fall at home. Work with your health care provider to learn what changes you can make to improve your health and wellness and to prevent falls. This information is not intended to replace advice given to you by your health care provider. Make sure you discuss any questions you have with your health care provider. Document Revised: 01/03/2021 Document Reviewed: 01/03/2021 Elsevier Patient Education  2022 Elsevier Inc.  

## 2021-08-15 NOTE — Assessment & Plan Note (Signed)
Recent episodes of chest pain.  No signs of MI. Abnormal EKG raising possibility of old infarct. Needs cardiology evaluation and echocardiogram. Referral placed today.

## 2021-08-15 NOTE — Assessment & Plan Note (Signed)
Diet and nutrition discussed.  Continue atorvastatin 10 mg daily. 

## 2021-08-15 NOTE — Assessment & Plan Note (Signed)
Well-controlled hypertension. Continue Benicar-HCTZ 20--12.5 mg daily. Dietary approach to stop hypertension discussed.

## 2021-08-16 ENCOUNTER — Ambulatory Visit (INDEPENDENT_AMBULATORY_CARE_PROVIDER_SITE_OTHER): Payer: BC Managed Care – PPO | Admitting: Orthopaedic Surgery

## 2021-08-16 ENCOUNTER — Encounter: Payer: Self-pay | Admitting: Orthopaedic Surgery

## 2021-08-16 ENCOUNTER — Other Ambulatory Visit: Payer: Self-pay | Admitting: Orthopaedic Surgery

## 2021-08-16 ENCOUNTER — Ambulatory Visit: Payer: BC Managed Care – PPO | Admitting: Orthopaedic Surgery

## 2021-08-16 ENCOUNTER — Ambulatory Visit: Payer: Self-pay

## 2021-08-16 DIAGNOSIS — M25562 Pain in left knee: Secondary | ICD-10-CM | POA: Diagnosis not present

## 2021-08-16 DIAGNOSIS — G8929 Other chronic pain: Secondary | ICD-10-CM | POA: Diagnosis not present

## 2021-08-16 NOTE — Progress Notes (Signed)
Office Visit Note   Patient: Shelly Simpson           Date of Birth: [birth year]           MRN: [id number] Visit Date: 08/16/2021              Requested by: Horald Pollen, MD [place],  [place] [address] PCP: Horald Pollen, MD   Assessment & Plan: Visit Diagnoses: No diagnosis found.  Plan: Patient is here for discussion of her left knee pain.  She has a history of left knee pain and valgus arthritis of about 10 degrees.  She had a cortisone injection but this only lasted for about a week.  She is status post right knee replacement several years ago by Dr. Vanita Panda.  She says the pain is starting to awaken her at night.  We discussed conservative treatment including another steroid injection as well as viscosupplementation as below as total knee replacement.  A thorough discussion of the risks and recovery were discussed with her she will contact us after discussing this with her family  Follow-Up Instructions: No follow-ups on file.   Orders:  No orders of the defined types were placed in this encounter.  No orders of the defined types were placed in this encounter.     Procedures: No procedures performed   Clinical Data: No additional findings.   Subjective: Chief Complaint  Patient presents with   Left Knee - Pain  Patient presents today for left knee pain. She said that it has been hurting for awhile. She has received cortisone injections in the past and gotten relief. She takes over the counter medicine as needed.     Review of Systems  All other systems reviewed and are negative.   Objective: Vital Signs: There were no vitals taken for this visit.  Physical Exam Patient is alert pleasant to exam Ortho Exam Examination of her left knee she has 10 degrees of valgus.  She has a small effusion.  She is focally tender on the medial joint line and the patellofemoral joint.  Positive crepitus with range of motion.  No erythema or sign of  infection Specialty Comments:  No specialty comments available.  Imaging: No results found.   PMFS History: Patient Active Problem List   Diagnosis Date Noted   Chronic pain of left knee 08/15/2021   Abnormal EKG 08/15/2021   Ischial bursitis of left side 10/19/2020   Essential hypertension 08/10/2020   Dyslipidemia 08/10/2020   Seizure disorder ([place]) 08/10/2020   Pain in left shoulder 08/20/2019   Unilateral primary osteoarthritis, left knee 06/12/2017   Malignant neoplasm of overlapping sites of right breast in female, estrogen receptor positive ([place]) 06/25/2014   History of breast cancer    Family history of ovarian cancer    Family history of breast cancer in female    Past Medical History:  Diagnosis Date   Allergy    Breast cancer ([place])    Cancer ([place])    Family history of breast cancer in female    Family history of ovarian cancer    History of breast cancer    Hypertension    Seizures ([place])     Family History  Problem Relation Age of Onset   Ovarian cancer Mother 50       also uterine sarcoma   Other Father    Breast cancer Daughter 20   Breast cancer Maternal Aunt    Breast cancer Maternal Uncle  female breast cancer; deceased 1   Cancer Maternal Grandmother    Heart disease Maternal Grandfather    Hypertension Maternal Grandfather    Hypertension Paternal Grandfather    Breast cancer Cousin 27       Currently 51    Past Surgical History:  Procedure Laterality Date   ABDOMINAL HYSTERECTOMY     BREAST LUMPECTOMY Right 2009   BREAST SURGERY     JOINT REPLACEMENT     SPINE SURGERY     laminectomy   Social History   Occupational History   Not on file  Tobacco Use   Smoking status: Never   Smokeless tobacco: Never  Vaping Use   Vaping Use: Never used  Substance and Sexual Activity   Alcohol use: No   Drug use: No   Sexual activity: Not on file

## 2021-08-23 ENCOUNTER — Ambulatory Visit: Payer: BC Managed Care – PPO | Admitting: Orthopaedic Surgery

## 2021-09-08 ENCOUNTER — Ambulatory Visit: Payer: BC Managed Care – PPO | Admitting: Cardiology

## 2021-09-09 ENCOUNTER — Telehealth: Payer: Self-pay

## 2021-09-09 NOTE — Telephone Encounter (Signed)
Pt received a notice from [place] stating that You Dr. Prescribed this medication olmesartan-hydrochlorothiazide (BENICAR HCT) 20-12.5 MG tablet  and you are taking it as prescribe. This could be damaging to your kidneys.   Pt called sounding very concerned about receiving this notice for the stated medication. She would like clarity that this isn't damaging her kidneys taking it as directed .  Please advise [phone]

## 2021-09-14 NOTE — Telephone Encounter (Signed)
It is not.  Thanks.

## 2021-09-27 ENCOUNTER — Ambulatory Visit: Payer: BC Managed Care – PPO | Admitting: Cardiology

## 2021-10-07 ENCOUNTER — Other Ambulatory Visit: Payer: Self-pay | Admitting: Emergency Medicine

## 2021-10-07 ENCOUNTER — Telehealth: Payer: Self-pay | Admitting: Emergency Medicine

## 2021-10-07 ENCOUNTER — Other Ambulatory Visit: Payer: Self-pay

## 2021-10-07 DIAGNOSIS — G40909 Epilepsy, unspecified, not intractable, without status epilepticus: Secondary | ICD-10-CM

## 2021-10-07 DIAGNOSIS — E785 Hyperlipidemia, unspecified: Secondary | ICD-10-CM

## 2021-10-07 MED ORDER — ATORVASTATIN CALCIUM 10 MG PO TABS: ORAL_TABLET | ORAL | 3 refills | Status: DC

## 2021-10-07 MED ORDER — PHENYTOIN SODIUM EXTENDED 100 MG PO CAPS: ORAL_CAPSULE | ORAL | 1 refills | Status: DC

## 2021-10-07 NOTE — Progress Notes (Signed)
Refilled Atorvastatin 10mg  to [facility].

## 2021-10-07 NOTE — Telephone Encounter (Signed)
Pt states the wrong prescription was filled, pt states she takes phenytoin sodium extended release 100 mg  Pt requesting rx sent to [facility], [place], [place] [address]

## 2021-10-07 NOTE — Telephone Encounter (Signed)
1.Medication Requested: phenytoin (DILANTIN) 100 MG ER capsule  2. Pharmacy (Name, [address], [place]): [id number] - [place], [place]  3. On Med List: yes   4. Last Visit with PCP: 08-15-2021  5. Next visit date with PCP: 02-14-2022  Pt states she is out of medication due to [facility] sending meds from the wrong manufacture

## 2021-10-07 NOTE — Telephone Encounter (Signed)
New prescription sent to pharmacy requested

## 2021-10-26 ENCOUNTER — Ambulatory Visit: Payer: BC Managed Care – PPO | Admitting: Cardiology

## 2021-11-03 NOTE — Progress Notes (Unsigned)
Cardiology Office Note:    Date:  11/03/2021   ID:  KENDRE SIRES, DOB April 19, 1946, MRN [id number]  PCP:  Horald Pollen, MD   [place] Providers Cardiologist:  None { Click to update primary MD,subspecialty MD or APP then REFRESH:1}    Referring MD: Horald Pollen, *   No chief complaint on file. ***  History of Present Illness:    SHIKHA BIBB is a 76 y.o. female with a hx of ***  Past Medical History:  Diagnosis Date   Allergy    Breast cancer ([place])    Cancer ([place])    Family history of breast cancer in female    Family history of ovarian cancer    History of breast cancer    Hypertension    Seizures ([place])     Past Surgical History:  Procedure Laterality Date   ABDOMINAL HYSTERECTOMY     BREAST LUMPECTOMY Right 2009   BREAST SURGERY     JOINT REPLACEMENT     SPINE SURGERY     laminectomy    Current Medications: No outpatient medications have been marked as taking for the 11/10/21 encounter (Appointment) with Freada Bergeron, MD.     Allergies:   Iohexol and Sulfa antibiotics   Social History   Socioeconomic History   Marital status: Married    Spouse name: Not on file   Number of children: 2   Years of education: Not on file   Highest education level: Not on file  Occupational History   Not on file  Tobacco Use   Smoking status: Never   Smokeless tobacco: Never  Vaping Use   Vaping Use: Never used  Substance and Sexual Activity   Alcohol use: No   Drug use: No   Sexual activity: Not on file  Other Topics Concern   Not on file  Social History Narrative   Not on file   Social Determinants of Health   Financial Resource Strain: Not on file  Food Insecurity: Not on file  Transportation Needs: Not on file  Physical Activity: Not on file  Stress: Not on file  Social Connections: Not on file     Family History: The patient's ***family history includes Breast cancer in her maternal aunt and maternal uncle; Breast  cancer (age of onset: 59) in her cousin; Breast cancer (age of onset: 35) in her daughter; Cancer in her maternal grandmother; Heart disease in her maternal grandfather; Hypertension in her maternal grandfather and paternal grandfather; Other in her father; Ovarian cancer (age of onset: 43) in her mother.  ROS:   Please see the history of present illness.    *** All other systems reviewed and are negative.  EKGs/Labs/Other Studies Reviewed:    The following studies were reviewed today: ***  EKG:  EKG is *** ordered today.  The ekg ordered today demonstrates ***  Recent Labs: 08/11/2021: BUN 19; Creatinine, Ser 0.85; Hemoglobin 14.4; Platelets 276; Potassium 3.9; Sodium 139  Recent Lipid Panel    Component Value Date/Time   CHOL 202 (H) 01/07/2020 1507   TRIG 162 (H) 01/07/2020 1507   HDL 61 01/07/2020 1507   CHOLHDL 3.3 01/07/2020 1507   LDLCALC 113 (H) 01/07/2020 1507     Risk Assessment/Calculations:   {Does this patient have ATRIAL FIBRILLATION?:[phone]}       Physical Exam:    VS:  There were no vitals taken for this visit.    Wt Readings from Last 3 Encounters:  08/11/21 224 lb (101.6 kg)  06/16/21 223 lb 3.2 oz (101.2 kg)  11/10/20 227 lb (103 kg)     GEN: *** Well nourished, well developed in no acute distress HEENT: Normal NECK: No JVD; No carotid bruits LYMPHATICS: No lymphadenopathy CARDIAC: ***RRR, no murmurs, rubs, gallops RESPIRATORY:  Clear to auscultation without rales, wheezing or rhonchi  ABDOMEN: Soft, non-tender, non-distended MUSCULOSKELETAL:  No edema; No deformity  SKIN: Warm and dry NEUROLOGIC:  Alert and oriented x 3 PSYCHIATRIC:  Normal affect   ASSESSMENT:    No diagnosis found. PLAN:    In order of problems listed above:  ***      {Are you ordering a CV Procedure (e.g. stress test, cath, DCCV, TEE, etc)?   Press F2        :[id number]}    Medication Adjustments/Labs and Tests Ordered: Current medicines are reviewed at  length with the patient today.  Concerns regarding medicines are outlined above.  No orders of the defined types were placed in this encounter.  No orders of the defined types were placed in this encounter.   There are no Patient Instructions on file for this visit.   Signed, Freada Bergeron, MD  11/03/2021 3:36 PM    [place]

## 2021-11-06 ENCOUNTER — Other Ambulatory Visit: Payer: Self-pay | Admitting: Emergency Medicine

## 2021-11-06 DIAGNOSIS — I10 Essential (primary) hypertension: Secondary | ICD-10-CM

## 2021-11-10 ENCOUNTER — Encounter: Payer: Self-pay | Admitting: Cardiology

## 2021-11-10 ENCOUNTER — Ambulatory Visit (INDEPENDENT_AMBULATORY_CARE_PROVIDER_SITE_OTHER): Payer: BC Managed Care – PPO | Admitting: Cardiology

## 2021-11-10 ENCOUNTER — Other Ambulatory Visit: Payer: Self-pay

## 2021-11-10 VITALS — BP 126/78 | HR 89 | Ht 65.0 in | Wt 224.4 lb

## 2021-11-10 DIAGNOSIS — R9431 Abnormal electrocardiogram [ECG] [EKG]: Secondary | ICD-10-CM | POA: Diagnosis not present

## 2021-11-10 DIAGNOSIS — E785 Hyperlipidemia, unspecified: Secondary | ICD-10-CM | POA: Diagnosis not present

## 2021-11-10 DIAGNOSIS — Z9221 Personal history of antineoplastic chemotherapy: Secondary | ICD-10-CM | POA: Diagnosis not present

## 2021-11-10 DIAGNOSIS — Z79899 Other long term (current) drug therapy: Secondary | ICD-10-CM | POA: Diagnosis not present

## 2021-11-10 NOTE — Progress Notes (Signed)
?Cardiology Office Note:   ? ?Date:  11/10/2021  ? ?ID:  Shelly Simpson, DOB [birth year], MRN [id number] ? ?PCP:  Horald Pollen, MD ?  ?[facility] Providers ?Cardiologist:  None { ? ? ?Referring MD: Horald Pollen, *  ? ? ?History of Present Illness:   ? ?Shelly Simpson is a 76 y.o. female with a hx of HTN, seizure disorder and breast cancer who was referred by Dr. Mitchel Honour for further evaluation of an abnormal ECG. ? ?Patient was seen in 07/2021 in the ER after episodes of sharp chest pain at home found to have ECG with NSR, possible inferior q waves and poor r-wave progression concerning for possible prior MI. In the ER, trops were negative and CXR without acute pathology. She was discharged home with plans to follow-up with Cardiology for further management. ? ?Today, the patient states that she is feeling fine. She denies any prior MI or heart issues. Currently she is not experiencing any cardiovascular symptoms. She states that in the ER, the person performing the ECG was told that they placed the pads wrong, which may have caused the abnormality seen. Inferior q waves are not present on current tracing. Otherwise, she denies any chest pain, SOB, lightheadedness, LE edema or orthopnea. Typically she is an active person. She is only limited by knee pain, which she attributes to a prior right total knee arthroplasty. ? ?When she had breast cancer in 2009, she was treated with chemotherapy and radiation. At this time she is in remission, and not taking any medication for this. In her family, her uncle also had breast cancer. ? ?She denies any palpitations, chest pain, shortness of breath, or peripheral edema. No lightheadedness, headaches, syncope, orthopnea, or PND. ?   ?She confirms her cholesterol has not been checked recently. Currently she is taking atorvastatin and CoQ10. ? ?Past Medical History:  ?Diagnosis Date  ? Allergy   ? Breast cancer ([place])   ? Cancer [facility])   ? Family history of  breast cancer in female   ? Family history of ovarian cancer   ? History of breast cancer   ? Hypertension   ? Seizures ([place])   ? ? ?Past Surgical History:  ?Procedure Laterality Date  ? ABDOMINAL HYSTERECTOMY    ? BREAST LUMPECTOMY Right 2009  ? BREAST SURGERY    ? JOINT REPLACEMENT    ? SPINE SURGERY    ? laminectomy  ? ? ?Current Medications: ?Current Meds  ?Medication Sig  ? albuterol (PROAIR HFA) 108 (90 Base) MCG/ACT inhaler Inhale 2 puffs into the lungs every 6 (six) hours as needed.  ? Alum Hydroxide-Mag Carbonate (GAVISCON PO) Take by mouth.  ? atorvastatin (LIPITOR) 10 MG tablet TAKE 1 TABLET DAILY AT 6PM  ? calcium citrate-vitamin D (CITRACAL+D) 315-200 MG-UNIT per tablet Take 1 tablet by mouth 2 (two) times daily.  ? cetirizine (ZYRTEC) 10 MG tablet Take 10 mg by mouth daily as needed.   ? fluticasone (FLONASE) 50 MCG/ACT nasal spray USE 2 SPRAYS IN EACH       NOSTRIL DAILY AS NEEDED  ? metFORMIN (GLUCOPHAGE-XR) 500 MG 24 hr tablet TAKE 1 TABLET BY MOUTH EVERY DAY WITH BREAKFAST  ? Multiple Vitamin (MULTIVITAMIN) capsule Take 1 capsule by mouth daily.  ? olmesartan-hydrochlorothiazide (BENICAR HCT) 20-12.5 MG tablet TAKE 1 TABLET BY MOUTH EVERY DAY  ? phenytoin (DILANTIN) 100 MG ER capsule TAKE 3 CAPSULES ('300MG'$  TOTAL) AT BEDTIME. orange oblong TARO-PHN form only!!!  ?  ? ?  Allergies:   Iohexol and Sulfa antibiotics  ? ?Social History  ? ?Socioeconomic History  ? Marital status: Married  ?  Spouse name: Not on file  ? Number of children: 2  ? Years of education: Not on file  ? Highest education level: Not on file  ?Occupational History  ? Not on file  ?Tobacco Use  ? Smoking status: Never  ? Smokeless tobacco: Never  ?Vaping Use  ? Vaping Use: Never used  ?Substance and Sexual Activity  ? Alcohol use: No  ? Drug use: No  ? Sexual activity: Not on file  ?Other Topics Concern  ? Not on file  ?Social History Narrative  ? Not on file  ? ?Social Determinants of Health  ? ?Financial Resource Strain: Not on file   ?Food Insecurity: Not on file  ?Transportation Needs: Not on file  ?Physical Activity: Not on file  ?Stress: Not on file  ?Social Connections: Not on file  ?  ? ?Family History: ?The patient's family history includes Breast cancer in her maternal aunt and maternal uncle; Breast cancer (age of onset: 87) in her cousin; Breast cancer (age of onset: 53) in her daughter; Cancer in her maternal grandmother; Heart disease in her maternal grandfather; Hypertension in her maternal grandfather and paternal grandfather; Other in her father; Ovarian cancer (age of onset: 20) in her mother. ? ?ROS:   ?Review of Systems  ?Constitutional:  Negative for chills and diaphoresis.  ?HENT:  Negative for nosebleeds and tinnitus.   ?Eyes:  Negative for photophobia and redness.  ?Respiratory:  Negative for cough and shortness of breath.   ?Cardiovascular:  Negative for chest pain, palpitations, orthopnea, claudication, leg swelling and PND.  ?Gastrointestinal:  Negative for blood in stool and vomiting.  ?Genitourinary:  Negative for hematuria.  ?Musculoskeletal:  Positive for joint pain. Negative for back pain.  ?Neurological:  Negative for loss of consciousness and headaches.  ?Endo/Heme/Allergies:  Negative for polydipsia.  ?Psychiatric/Behavioral:  Negative for substance abuse. The patient does not have insomnia.   ? ? ?EKGs/Labs/Other Studies Reviewed:   ? ?The following studies were reviewed today: ? ?No prior cardiovascular studies available. ? ? ?EKG:  EKG is personally reviewed. ?11/10/2021: Sinus rhythm. Rate 89 bpm. RBBB. Low voltage. ? ? ?Recent Labs: ?08/11/2021: BUN 19; Creatinine, Ser 0.85; Hemoglobin 14.4; Platelets 276; Potassium 3.9; Sodium 139  ? ?Recent Lipid Panel ?   ?Component Value Date/Time  ? CHOL 202 (H) 01/07/2020 1507  ? TRIG 162 (H) 01/07/2020 1507  ? HDL 61 01/07/2020 1507  ? CHOLHDL 3.3 01/07/2020 1507  ? LDLCALC 113 (H) 01/07/2020 1507  ? ? ? ?Risk Assessment/Calculations:   ?  ? ?    ? ?Physical Exam:    ? ?VS:  BP 126/78   Pulse 89   Ht '5\' 5"'$  (1.651 m)   Wt 224 lb 6.4 oz (101.8 kg)   SpO2 98%   BMI 37.34 kg/m?    ? ?Wt Readings from Last 3 Encounters:  ?11/10/21 224 lb 6.4 oz (101.8 kg)  ?08/11/21 224 lb (101.6 kg)  ?06/16/21 223 lb 3.2 oz (101.2 kg)  ?  ? ?GEN: Well nourished, well developed in no acute distress ?HEENT: Normal ?NECK: No JVD; No carotid bruits ?CARDIAC: RRR, no murmurs, rubs, gallops ?RESPIRATORY:  Clear to auscultation without rales, wheezing or rhonchi  ?ABDOMEN: Soft, non-tender, non-distended ?MUSCULOSKELETAL:  No edema; No deformity  ?SKIN: Warm and dry ?NEUROLOGIC:  Alert and oriented x 3 ?PSYCHIATRIC:  Normal affect  ? ?  ASSESSMENT:   ? ?1. Abnormal EKG   ?2. Dyslipidemia   ?3. Hyperlipidemia, unspecified hyperlipidemia type   ?4. History of chemotherapy   ?5. Medication management   ?6. History of breast cancer   ? ?PLAN:   ? ?In order of problems listed above: ? ?#Abnormal ECG: ?Patient with recent ER visit for chest pain with ECG showing possible inferior q waves and poor r wave progression prompting referral to cardiology. Notably, per patient, she was told that the ecg pads for the testing were placed incorrectly which may have led to her ECG findings. ECG today with NSR, no inferior q wave, RBBB. She denies any current cardiovascular symptoms. Discussed the options of stress test and calcium score and given lack of symptoms, will start with Ca score for risk stratification. Will check TTE as well given history of Chemo/XRT and assess for WMA.  ?-Check Ca score ?-Check TTE ? ?#HTN: ?Well controlled. ?-Continue olmesartan-HCTZ 20-12.'5mg'$  daily ? ?#HLD: ?-Continue lipitor '10mg'$  daily ?-Repeat lipids ?-Check Ca score ? ?#DMII: ?-Management per PCP ? ?#History of Breast Cancer s/p chemo XRT: ?Last treatment in 2009. In remission. ?-Check TTE with strain ? ?   ? ?   ?Follow-up:  1 year if testing results normal. ? ?Medication Adjustments/Labs and Tests Ordered: ?Current medicines are  reviewed at length with the patient today.  Concerns regarding medicines are outlined above.  ? ?Orders Placed This Encounter  ?Procedures  ? CT CARDIAC SCORING (SELF PAY ONLY)  ? Lipid Profile  ? EKG 12-Lead  ? ECHO

## 2021-11-10 NOTE — Patient Instructions (Signed)
Medication Instructions:  ? ?Your physician recommends that you continue on your current medications as directed. Please refer to the Current Medication list given to you today. ? ?*If you need a refill on your cardiac medications before your next appointment, please call your pharmacy* ? ? ?Lab Work: ? ?IN ONE WEEK HERE IN THE OFFICE--CHECK LIPIDS--PLEASE COME FASTING TO THIS LAB APPOINTMENT ? ?If you have labs (blood work) drawn today and your tests are completely normal, you will receive your results only by: ?MyChart Message (if you have MyChart) OR ?A paper copy in the mail ?If you have any lab test that is abnormal or we need to change your treatment, we will call you to review the results. ? ? ?Testing/Procedures: ? ?CARDIAC CALCIUM SCORE (SELF PAY) DONE HERE IN THE OFFICE ? ? ?Your physician has requested that you have an echocardiogram. Echocardiography is a painless test that uses sound waves to create images of your heart. It provides your doctor with information about the size and shape of your heart and how well your heart?s chambers and valves are working. This procedure takes approximately one hour. There are no restrictions for this procedure.  DO ECHO WITH STRAIN PER DR. Johney Frame ? ? ? ?Follow-Up: ? ?AS NEEDED WITH DR. Johney Frame ? ? ? ?

## 2021-11-14 ENCOUNTER — Other Ambulatory Visit: Payer: Self-pay | Admitting: Emergency Medicine

## 2021-11-14 ENCOUNTER — Telehealth: Payer: Self-pay | Admitting: *Deleted

## 2021-11-14 DIAGNOSIS — G40909 Epilepsy, unspecified, not intractable, without status epilepticus: Secondary | ICD-10-CM

## 2021-11-14 NOTE — Telephone Encounter (Signed)
Someone already placed an order for lipid profile on 11/10/2021.

## 2021-11-14 NOTE — Telephone Encounter (Signed)
Patient called and is requesting labs to check her cholesterol. Please advise  ?

## 2021-11-15 ENCOUNTER — Encounter: Payer: Self-pay | Admitting: *Deleted

## 2021-11-17 ENCOUNTER — Other Ambulatory Visit: Payer: BC Managed Care – PPO

## 2021-11-21 ENCOUNTER — Other Ambulatory Visit: Payer: BC Managed Care – PPO | Admitting: *Deleted

## 2021-11-21 ENCOUNTER — Other Ambulatory Visit: Payer: BC Managed Care – PPO

## 2021-11-21 ENCOUNTER — Ambulatory Visit (HOSPITAL_COMMUNITY): Payer: BC Managed Care – PPO | Attending: Cardiology

## 2021-11-21 ENCOUNTER — Ambulatory Visit (INDEPENDENT_AMBULATORY_CARE_PROVIDER_SITE_OTHER)
Admission: RE | Admit: 2021-11-21 | Discharge: 2021-11-21 | Disposition: A | Discharge status: Home / Self Care | Payer: Self-pay | Source: Ambulatory Visit | Attending: Cardiology | Admitting: Cardiology

## 2021-11-21 ENCOUNTER — Other Ambulatory Visit: Payer: Self-pay

## 2021-11-21 DIAGNOSIS — E785 Hyperlipidemia, unspecified: Secondary | ICD-10-CM

## 2021-11-21 DIAGNOSIS — Z0189 Encounter for other specified special examinations: Secondary | ICD-10-CM

## 2021-11-21 DIAGNOSIS — Z79899 Other long term (current) drug therapy: Secondary | ICD-10-CM | POA: Diagnosis not present

## 2021-11-21 DIAGNOSIS — Z9221 Personal history of antineoplastic chemotherapy: Secondary | ICD-10-CM | POA: Diagnosis not present

## 2021-11-21 LAB — LIPID PANEL
Chol/HDL Ratio: 2.7 ratio (ref 0.0–4.4)
Cholesterol, Total: 174 mg/dL (ref 100–199)
HDL: 64 mg/dL (ref >39)
LDL Chol Calc (NIH): 88 mg/dL (ref 0–99)
Triglycerides: 127 mg/dL (ref 0–149)
VLDL Cholesterol Cal: 22 mg/dL (ref 5–40)

## 2021-11-21 LAB — ECHOCARDIOGRAM COMPLETE
Area-P 1/2: 3.42 cm2
S' Lateral: 2.7 cm

## 2021-11-22 ENCOUNTER — Telehealth: Payer: Self-pay | Admitting: *Deleted

## 2021-11-22 MED ORDER — ATORVASTATIN CALCIUM 20 MG PO TABS: 20.0000 mg | ORAL_TABLET | Freq: Every day | ORAL | 3 refills | Status: DC

## 2021-11-22 NOTE — Telephone Encounter (Signed)
-----   Message from Freada Bergeron, MD sent at 11/22/2021  8:36 AM EDT ----- ?Her Ca score is 240 which is the 79% for her age, gender, race matched controls. This means she has some plaquing in her heart arteries. To prevent this plaque from worsening, we will increase her lipitor to '20mg'$  daily and try to lower her LDL cholesterol to <70. ?

## 2021-11-22 NOTE — Telephone Encounter (Signed)
The patient has been notified of the result and verbalized understanding.  All questions (if any) were answered. ? ?Pt aware we will increase her lipitor to 20 mg po daily. ?Confirmed the pharmacy of choice with the pt. ?Pt verbalized understanding and agrees with this plan. ? ?

## 2021-12-06 ENCOUNTER — Other Ambulatory Visit (HOSPITAL_COMMUNITY): Payer: BC Managed Care – PPO

## 2021-12-14 ENCOUNTER — Encounter: Payer: Self-pay | Admitting: Emergency Medicine

## 2021-12-26 ENCOUNTER — Encounter: Payer: Self-pay | Admitting: Cardiology

## 2021-12-26 ENCOUNTER — Other Ambulatory Visit: Payer: Self-pay | Admitting: Emergency Medicine

## 2021-12-27 ENCOUNTER — Other Ambulatory Visit: Payer: Self-pay | Admitting: *Deleted

## 2021-12-27 MED ORDER — ATORVASTATIN CALCIUM 20 MG PO TABS: 20.0000 mg | ORAL_TABLET | Freq: Every day | ORAL | 3 refills | Status: DC

## 2021-12-27 NOTE — Addendum Note (Signed)
Addended by: Gaetano Net on: 12/27/2021 07:06 AM ? ? Modules accepted: Orders ? ?

## 2022-01-03 ENCOUNTER — Other Ambulatory Visit: Payer: Self-pay | Admitting: Emergency Medicine

## 2022-01-03 DIAGNOSIS — G40909 Epilepsy, unspecified, not intractable, without status epilepticus: Secondary | ICD-10-CM

## 2022-01-05 ENCOUNTER — Telehealth: Payer: Self-pay

## 2022-01-05 MED ORDER — METFORMIN HCL ER 500 MG PO TB24: ORAL_TABLET | ORAL | 1 refills | Status: DC

## 2022-01-05 NOTE — Telephone Encounter (Signed)
Pt is requesting a refill for: ?metFORMIN (GLUCOPHAGE-XR) 500 MG 24 hr tablet ? ?Pharmacy: [id number] [place] [place] [address]? ?LOV 08/15/21 ?ROV 02/14/22 ?

## 2022-01-05 NOTE — Telephone Encounter (Signed)
Prescription sent to pharmacy on file. 

## 2022-02-10 ENCOUNTER — Telehealth: Payer: Self-pay

## 2022-02-10 DIAGNOSIS — Z170 Estrogen receptor positive status [ER+]: Secondary | ICD-10-CM

## 2022-02-10 NOTE — Telephone Encounter (Signed)
Pt called to ask about future appts. Advised pt she is to see [place], NP 05/2022. Appt confirmed. Orders placed for screening MM per NP. Pt knows she should call to schedule.

## 2022-02-14 ENCOUNTER — Ambulatory Visit: Payer: BC Managed Care – PPO | Admitting: Emergency Medicine

## 2022-02-14 ENCOUNTER — Ambulatory Visit (INDEPENDENT_AMBULATORY_CARE_PROVIDER_SITE_OTHER): Payer: BC Managed Care – PPO | Admitting: Emergency Medicine

## 2022-02-14 ENCOUNTER — Encounter: Payer: Self-pay | Admitting: Emergency Medicine

## 2022-02-14 VITALS — BP 136/74 | HR 88 | Temp 98.1°F | Ht 65.0 in | Wt 228.0 lb

## 2022-02-14 DIAGNOSIS — R350 Frequency of micturition: Secondary | ICD-10-CM | POA: Insufficient documentation

## 2022-02-14 DIAGNOSIS — G40909 Epilepsy, unspecified, not intractable, without status epilepticus: Secondary | ICD-10-CM

## 2022-02-14 DIAGNOSIS — M25562 Pain in left knee: Secondary | ICD-10-CM | POA: Diagnosis not present

## 2022-02-14 DIAGNOSIS — Z170 Estrogen receptor positive status [ER+]: Secondary | ICD-10-CM

## 2022-02-14 DIAGNOSIS — E785 Hyperlipidemia, unspecified: Secondary | ICD-10-CM

## 2022-02-14 DIAGNOSIS — C50811 Malignant neoplasm of overlapping sites of right female breast: Secondary | ICD-10-CM

## 2022-02-14 DIAGNOSIS — I10 Essential (primary) hypertension: Secondary | ICD-10-CM | POA: Diagnosis not present

## 2022-02-14 DIAGNOSIS — G8929 Other chronic pain: Secondary | ICD-10-CM

## 2022-02-14 DIAGNOSIS — Z8639 Personal history of other endocrine, nutritional and metabolic disease: Secondary | ICD-10-CM | POA: Insufficient documentation

## 2022-02-14 LAB — URINALYSIS, ROUTINE W REFLEX MICROSCOPIC
Bilirubin Urine: NEGATIVE
Ketones, ur: NEGATIVE
Nitrite: NEGATIVE
Specific Gravity, Urine: 1.02 (ref 1.000–1.030)
Total Protein, Urine: NEGATIVE
Urine Glucose: NEGATIVE
Urobilinogen, UA: 0.2 (ref 0.0–1.0)
pH: 6 (ref 5.0–8.0)

## 2022-02-14 LAB — COMPREHENSIVE METABOLIC PANEL
ALT: 26 U/L (ref 0–35)
AST: 20 U/L (ref 0–37)
Albumin: 4.4 g/dL (ref 3.5–5.2)
Alkaline Phosphatase: 68 U/L (ref 39–117)
BUN: 15 mg/dL (ref 6–23)
CO2: 27 mEq/L (ref 19–32)
Calcium: 9.9 mg/dL (ref 8.4–10.5)
Chloride: 103 mEq/L (ref 96–112)
Creatinine, Ser: 0.9 mg/dL (ref 0.40–1.20)
GFR: 63.36 mL/min (ref >60.00)
Glucose, Bld: 135 mg/dL — ABNORMAL HIGH (ref 70–99)
Potassium: 3.7 mEq/L (ref 3.5–5.1)
Sodium: 141 mEq/L (ref 135–145)
Total Bilirubin: 0.4 mg/dL (ref 0.2–1.2)
Total Protein: 7.3 g/dL (ref 6.0–8.3)

## 2022-02-14 LAB — HEMOGLOBIN A1C: Hgb A1c MFr Bld: 5.4 % (ref 4.6–6.5)

## 2022-02-14 MED ORDER — AMOXICILLIN 500 MG PO TABS: ORAL_TABLET | ORAL | 1 refills | Status: DC

## 2022-02-14 NOTE — Assessment & Plan Note (Signed)
No signs of infection.  No incontinence episodes. Urine sent for urinalysis and urine culture.

## 2022-02-14 NOTE — Assessment & Plan Note (Signed)
Well-controlled hypertension.  Continue olmesartan-hydrochlorothiazide 20-12.5 mg daily. BP Readings from Last 3 Encounters:  02/14/22 136/74  11/10/21 126/78  08/15/21 134/70

## 2022-02-14 NOTE — Assessment & Plan Note (Signed)
Stable.  Advised to take Tylenol as needed for pain and avoid NSAIDs if at all possible.

## 2022-02-14 NOTE — Assessment & Plan Note (Signed)
Very questionable history of seizure disorder about 40 years ago.  Never had witnessed seizure.  However she was started in the emergency room on Dilantin and has been taking it ever since.  Has never had a witnessed seizure.  Was referred to neurologist by me last year.  Had telephone visit.  Needs office visit.  Do not think she needs Dilantin.  Advised to start decreasing dose with ultimate goal of stopping it in the near future.

## 2022-02-14 NOTE — Patient Instructions (Signed)
Health Maintenance After Age 74 After age 74, you are at a higher risk for certain long-term diseases and infections as well as injuries from falls. Falls are a major cause of broken bones and head injuries in people who are older than age 74. Getting regular preventive care can help to keep you healthy and well. Preventive care includes getting regular testing and making lifestyle changes as recommended by your health care provider. Talk with your health care provider about: Which screenings and tests you should have. A screening is a test that checks for a disease when you have no symptoms. A diet and exercise plan that is right for you. What should I know about screenings and tests to prevent falls? Screening and testing are the best ways to find a health problem early. Early diagnosis and treatment give you the best chance of managing medical conditions that are common after age 74. Certain conditions and lifestyle choices may make you more likely to have a fall. Your health care provider may recommend: Regular vision checks. Poor vision and conditions such as cataracts can make you more likely to have a fall. If you wear glasses, make sure to get your prescription updated if your vision changes. Medicine review. Work with your health care provider to regularly review all of the medicines you are taking, including over-the-counter medicines. Ask your health care provider about any side effects that may make you more likely to have a fall. Tell your health care provider if any medicines that you take make you feel dizzy or sleepy. Strength and balance checks. Your health care provider may recommend certain tests to check your strength and balance while standing, walking, or changing positions. Foot health exam. Foot pain and numbness, as well as not wearing proper footwear, can make you more likely to have a fall. Screenings, including: Osteoporosis screening. Osteoporosis is a condition that causes  the bones to get weaker and break more easily. Blood pressure screening. Blood pressure changes and medicines to control blood pressure can make you feel dizzy. Depression screening. You may be more likely to have a fall if you have a fear of falling, feel depressed, or feel unable to do activities that you used to do. Alcohol use screening. Using too much alcohol can affect your balance and may make you more likely to have a fall. Follow these instructions at home: Lifestyle Do not drink alcohol if: Your health care provider tells you not to drink. If you drink alcohol: Limit how much you have to: 0-1 drink a day for women. 0-2 drinks a day for men. Know how much alcohol is in your drink. In the [place] one drink equals one 12 oz bottle of beer (355 mL), one 5 oz glass of wine (148 mL), or one 1 oz glass of hard liquor (44 mL). Do not use any products that contain nicotine or tobacco. These products include cigarettes, chewing tobacco, and vaping devices, such as e-cigarettes. If you need help quitting, ask your health care provider. Activity  Follow a regular exercise program to stay fit. This will help you maintain your balance. Ask your health care provider what types of exercise are appropriate for you. If you need a cane or walker, use it as recommended by your health care provider. Wear supportive shoes that have nonskid soles. Safety  Remove any tripping hazards, such as rugs, cords, and clutter. Install safety equipment such as grab bars in bathrooms and safety rails on stairs. Keep rooms and walkways   well-lit. General instructions Talk with your health care provider about your risks for falling. Tell your health care provider if: You fall. Be sure to tell your health care provider about all falls, even ones that seem minor. You feel dizzy, tiredness (fatigue), or off-balance. Take over-the-counter and prescription medicines only as told by your health care provider. These include  supplements. Eat a healthy diet and maintain a healthy weight. A healthy diet includes low-fat dairy products, low-fat (lean) meats, and fiber from whole grains, beans, and lots of fruits and vegetables. Stay current with your vaccines. Schedule regular health, dental, and eye exams. Summary Having a healthy lifestyle and getting preventive care can help to protect your health and wellness after age 74. Screening and testing are the best way to find a health problem early and help you avoid having a fall. Early diagnosis and treatment give you the best chance for managing medical conditions that are more common for people who are older than age 74. Falls are a major cause of broken bones and head injuries in people who are older than age 74. Take precautions to prevent a fall at home. Work with your health care provider to learn what changes you can make to improve your health and wellness and to prevent falls. This information is not intended to replace advice given to you by your health care provider. Make sure you discuss any questions you have with your health care provider. Document Revised: 01/03/2021 Document Reviewed: 01/03/2021 Elsevier Patient Education  2023 [employer]  

## 2022-02-14 NOTE — Progress Notes (Signed)
Shelly Simpson 76 y.o.   Chief Complaint  Patient presents with   Follow-up    6 month f/u    Urinary Frequency    Patient going to the bathroom more than often   Referral    Referral to a GYN     HISTORY OF PRESENT ILLNESS: This is a 76 y.o. female here for 54-monthfollow-up of hypertension and dyslipidemia. Seen by cardiologist 3 months ago.  Coronary calcium score at 79 percentile.  Had atorvastatin increased to 20 mg daily. Normal echocardiogram.  Reviewed with patient. History of breast cancer.  Sees oncologist on a regular basis.  Stable. History of hypertension on olmesartan-hydrochlorothiazide.  Stable. Urinary frequency for 1 month.  Thinks it is stress related.  Denies hematuria or burning with urination.  No fever or flank pain.  Denies nausea or vomiting.  Able to eat and drink okay. Also requesting GYN referral. Recently had colonoscopy.  Positive for polyps.  Advised to return in 3 to 5 years for repeat. Also requesting prescription for amoxicillin.  Has history of right total knee replacement and always takes amoxicillin prior to dental procedures due to this and risk of bacteremia. No other complaints or medical concerns today. BP Readings from Last 3 Encounters:  11/10/21 126/78  08/15/21 134/70  08/11/21 133/68   Wt Readings from Last 3 Encounters:  11/10/21 224 lb 6.4 oz (101.8 kg)  08/11/21 224 lb (101.6 kg)  06/16/21 223 lb 3.2 oz (101.2 kg)     Urinary Frequency  Associated symptoms include frequency. Pertinent negatives include no chills, nausea or vomiting.     Prior to Admission medications   Medication Sig Start Date End Date Taking? Authorizing Provider  albuterol (PROAIR HFA) 108 (90 Base) MCG/ACT inhaler Inhale 2 puffs into the lungs every 6 (six) hours as needed. 11/10/20   SHorald Pollen MD  Alum Hydroxide-Mag Carbonate (GAVISCON PO) Take by mouth.    [provider]  atorvastatin (LIPITOR) 20 MG tablet Take 1 tablet (20 mg  total) by mouth daily. 12/27/21   PFreada Bergeron MD  calcium citrate-vitamin D (CITRACAL+D) 315-200 MG-UNIT per tablet Take 1 tablet by mouth 2 (two) times daily.    [provider]  cetirizine (ZYRTEC) 10 MG tablet Take 10 mg by mouth daily as needed.     [provider]  fluticasone (FLONASE) 50 MCG/ACT nasal spray USE 2 SPRAYS IN EACH       NOSTRIL DAILY AS NEEDED 06/20/21   SHorald Pollen MD  lansoprazole (PREVACID) 30 MG capsule Take 1 capsule (30 mg total) by mouth daily with breakfast. 08/11/21 11/09/21  MLynden OxfordScales, PA-C  metFORMIN (GLUCOPHAGE-XR) 500 MG 24 hr tablet TAKE 1 TABLET BY MOUTH EVERY DAY WITH BREAKFAST 01/05/22   SHorald Pollen MD  Multiple Vitamin (MULTIVITAMIN) capsule Take 1 capsule by mouth daily.    [provider]  olmesartan-hydrochlorothiazide (BENICAR HCT) 20-12.5 MG tablet TAKE 1 TABLET BY MOUTH EVERY DAY 11/06/21   SHorald Pollen MD  phenytoin (DILANTIN) 100 MG ER capsule TAKE 3 CAPSULES ('300MG'$  TOTAL) AT BEDTIME. ORANGE OBLONG TARO-PHN FORM ONLY!!! 01/03/22   SHorald Pollen MD    Allergies  Allergen Reactions   Iohexol      Desc: pt states in '80's wheezing; dyspnea w/ contrast--pt needs pre meds in future; slg 04/24/2008    Sulfa Antibiotics     Patient Active Problem List   Diagnosis Date Noted   Chronic pain of left  knee 08/15/2021   Abnormal EKG 08/15/2021   Ischial bursitis of left side 10/19/2020   Essential hypertension 08/10/2020   Dyslipidemia 08/10/2020   Seizure disorder ([place]) 08/10/2020   Unilateral primary osteoarthritis, left knee 06/12/2017   Malignant neoplasm of overlapping sites of right breast in female, estrogen receptor positive ([place]) 06/25/2014   History of breast cancer    Family history of ovarian cancer    Family history of breast cancer in female     Past Medical History:  Diagnosis Date   Allergy    Breast cancer ([place])    Cancer ([place])    Family history of  breast cancer in female    Family history of ovarian cancer    History of breast cancer    Hypertension    Seizures ([place])     Past Surgical History:  Procedure Laterality Date   ABDOMINAL HYSTERECTOMY     BREAST LUMPECTOMY Right 2009   BREAST SURGERY     JOINT REPLACEMENT     SPINE SURGERY     laminectomy    Social History   Socioeconomic History   Marital status: Married    Spouse name: Not on file   Number of children: 2   Years of education: Not on file   Highest education level: Not on file  Occupational History   Not on file  Tobacco Use   Smoking status: Never   Smokeless tobacco: Never  Vaping Use   Vaping Use: Never used  Substance and Sexual Activity   Alcohol use: No   Drug use: No   Sexual activity: Not on file  Other Topics Concern   Not on file  Social History Narrative   Not on file   Social Determinants of Health   Financial Resource Strain: Not on file  Food Insecurity: Not on file  Transportation Needs: Not on file  Physical Activity: Not on file  Stress: Not on file  Social Connections: Not on file  Intimate Partner Violence: Not on file    Family History  Problem Relation Age of Onset   Ovarian cancer Mother 74       also uterine sarcoma   Other Father    Breast cancer Daughter 23   Breast cancer Maternal Aunt    Breast cancer Maternal Uncle        female breast cancer; deceased 68   Cancer Maternal Grandmother    Heart disease Maternal Grandfather    Hypertension Maternal Grandfather    Hypertension Paternal Grandfather    Breast cancer Cousin 23       Currently 30     Review of Systems  Constitutional: Negative.  Negative for chills and fever.  HENT: Negative.  Negative for congestion and sore throat.   Eyes: Negative.   Respiratory: Negative.  Negative for cough and shortness of breath.   Cardiovascular: Negative.  Negative for chest pain and palpitations.  Gastrointestinal:  Negative for abdominal pain, diarrhea, nausea  and vomiting.  Genitourinary:  Positive for frequency.  Skin: Negative.  Negative for rash.  Neurological: Negative.  Negative for dizziness and headaches.  All other systems reviewed and are negative.  Today's Vitals   02/14/22 0853  BP: 136/74  Pulse: 88  Temp: 98.1 F (36.7 C)  TempSrc: Oral  SpO2: 97%  Weight: 228 lb (103.4 kg)  Height: '5\' 5"'$  (1.651 m)   Body mass index is 37.94 kg/m.   Physical Exam Vitals reviewed.  Constitutional:  Appearance: Normal appearance.  HENT:     Head: Normocephalic.  Eyes:     Extraocular Movements: Extraocular movements intact.     Conjunctiva/sclera: Conjunctivae normal.     Pupils: Pupils are equal, round, and reactive to light.  Cardiovascular:     Rate and Rhythm: Normal rate and regular rhythm.     Pulses: Normal pulses.     Heart sounds: Normal heart sounds.  Pulmonary:     Effort: Pulmonary effort is normal.     Breath sounds: Normal breath sounds.  Musculoskeletal:     Cervical back: No tenderness.     Right lower leg: No edema.     Left lower leg: No edema.  Lymphadenopathy:     Cervical: No cervical adenopathy.  Skin:    General: Skin is warm and dry.     Capillary Refill: Capillary refill takes less than 2 seconds.  Neurological:     General: No focal deficit present.     Mental Status: She is alert and oriented to person, place, and time.  Psychiatric:        Mood and Affect: Mood normal.        Behavior: Behavior normal.      ASSESSMENT & PLAN: A total of 47 minutes was spent with the patient and counseling/coordination of care regarding preparing for this visit, review of most recent office visit notes, review of multiple chronic medical problems and their management, review of all medications, review of most recent echocardiogram and coronary calcium score test, review of most recent cardiologist office visit notes, education on nutrition, prognosis, documentation and need for follow-up.  Problem List  Items Addressed This Visit       Cardiovascular and Mediastinum   Essential hypertension - Primary    Well-controlled hypertension.  Continue olmesartan-hydrochlorothiazide 20-12.5 mg daily. BP Readings from Last 3 Encounters:  02/14/22 136/74  11/10/21 126/78  08/15/21 134/70         Relevant Orders   Comprehensive metabolic panel   Hemoglobin A1c     Nervous and Auditory   Seizure disorder ([place])    Very questionable history of seizure disorder about 40 years ago.  Never had witnessed seizure.  However she was started in the emergency room on Dilantin and has been taking it ever since.  Has never had a witnessed seizure.  Was referred to neurologist by me last year.  Had telephone visit.  Needs office visit.  Do not think she needs Dilantin.  Advised to start decreasing dose with ultimate goal of stopping it in the near future.        Other   Malignant neoplasm of overlapping sites of right breast in female, estrogen receptor positive ([place])    Stable.  Sees oncologist on a regular basis.      Relevant Medications   amoxicillin (AMOXIL) 500 MG tablet   Dyslipidemia    Stable.  Had atorvastatin increased to 20 mg recently after review of coronary calcium score on CT of chest.  Had normal echocardiogram.      Chronic pain of left knee    Stable.  Advised to take Tylenol as needed for pain and avoid NSAIDs if at all possible.      Urinary frequency    No signs of infection.  No incontinence episodes. Urine sent for urinalysis and urine culture.      Relevant Orders   Urine Culture   Urinalysis   Ambulatory referral to Gynecology   History of hyperglycemia  Secondary to phenytoin.  No history of diabetes.  Has been taking metformin 500 mg daily for years.  Advised to stop it.      Patient Instructions  Health Maintenance After Age 56 After age 31, you are at a higher risk for certain long-term diseases and infections as well as injuries from falls. Falls are a  major cause of broken bones and head injuries in people who are older than age 51. Getting regular preventive care can help to keep you healthy and well. Preventive care includes getting regular testing and making lifestyle changes as recommended by your health care provider. Talk with your health care provider about: Which screenings and tests you should have. A screening is a test that checks for a disease when you have no symptoms. A diet and exercise plan that is right for you. What should I know about screenings and tests to prevent falls? Screening and testing are the best ways to find a health problem early. Early diagnosis and treatment give you the best chance of managing medical conditions that are common after age 44. Certain conditions and lifestyle choices may make you more likely to have a fall. Your health care provider may recommend: Regular vision checks. Poor vision and conditions such as cataracts can make you more likely to have a fall. If you wear glasses, make sure to get your prescription updated if your vision changes. Medicine review. Work with your health care provider to regularly review all of the medicines you are taking, including over-the-counter medicines. Ask your health care provider about any side effects that may make you more likely to have a fall. Tell your health care provider if any medicines that you take make you feel dizzy or sleepy. Strength and balance checks. Your health care provider may recommend certain tests to check your strength and balance while standing, walking, or changing positions. Foot health exam. Foot pain and numbness, as well as not wearing proper footwear, can make you more likely to have a fall. Screenings, including: Osteoporosis screening. Osteoporosis is a condition that causes the bones to get weaker and break more easily. Blood pressure screening. Blood pressure changes and medicines to control blood pressure can make you feel  dizzy. Depression screening. You may be more likely to have a fall if you have a fear of falling, feel depressed, or feel unable to do activities that you used to do. Alcohol use screening. Using too much alcohol can affect your balance and may make you more likely to have a fall. Follow these instructions at home: Lifestyle Do not drink alcohol if: Your health care provider tells you not to drink. If you drink alcohol: Limit how much you have to: 0-1 drink a day for women. 0-2 drinks a day for men. Know how much alcohol is in your drink. In the [place] one drink equals one 12 oz bottle of beer (355 mL), one 5 oz glass of wine (148 mL), or one 1 oz glass of hard liquor (44 mL). Do not use any products that contain nicotine or tobacco. These products include cigarettes, chewing tobacco, and vaping devices, such as e-cigarettes. If you need help quitting, ask your health care provider. Activity  Follow a regular exercise program to stay fit. This will help you maintain your balance. Ask your health care provider what types of exercise are appropriate for you. If you need a cane or walker, use it as recommended by your health care provider. Wear supportive shoes that have  nonskid soles. Safety  Remove any tripping hazards, such as rugs, cords, and clutter. Install safety equipment such as grab bars in bathrooms and safety rails on stairs. Keep rooms and walkways well-lit. General instructions Talk with your health care provider about your risks for falling. Tell your health care provider if: You fall. Be sure to tell your health care provider about all falls, even ones that seem minor. You feel dizzy, tiredness (fatigue), or off-balance. Take over-the-counter and prescription medicines only as told by your health care provider. These include supplements. Eat a healthy diet and maintain a healthy weight. A healthy diet includes low-fat dairy products, low-fat (lean) meats, and fiber from whole  grains, beans, and lots of fruits and vegetables. Stay current with your vaccines. Schedule regular health, dental, and eye exams. Summary Having a healthy lifestyle and getting preventive care can help to protect your health and wellness after age 37. Screening and testing are the best way to find a health problem early and help you avoid having a fall. Early diagnosis and treatment give you the best chance for managing medical conditions that are more common for people who are older than age 65. Falls are a major cause of broken bones and head injuries in people who are older than age 36. Take precautions to prevent a fall at home. Work with your health care provider to learn what changes you can make to improve your health and wellness and to prevent falls. This information is not intended to replace advice given to you by your health care provider. Make sure you discuss any questions you have with your health care provider. Document Revised: 01/03/2021 Document Reviewed: 01/03/2021 Elsevier Patient Education  [place], MD [facility]

## 2022-02-14 NOTE — Assessment & Plan Note (Signed)
Secondary to phenytoin.  No history of diabetes.  Has been taking metformin 500 mg daily for years.  Advised to stop it.

## 2022-02-14 NOTE — Assessment & Plan Note (Signed)
Stable.  Sees oncologist on a regular basis. 

## 2022-02-14 NOTE — Assessment & Plan Note (Signed)
Stable.  Had atorvastatin increased to 20 mg recently after review of coronary calcium score on CT of chest.  Had normal echocardiogram.

## 2022-02-17 ENCOUNTER — Encounter: Payer: Self-pay | Admitting: Emergency Medicine

## 2022-02-17 ENCOUNTER — Other Ambulatory Visit: Payer: Self-pay | Admitting: Emergency Medicine

## 2022-02-17 DIAGNOSIS — N3000 Acute cystitis without hematuria: Secondary | ICD-10-CM

## 2022-02-17 LAB — URINE CULTURE

## 2022-02-17 MED ORDER — CEFUROXIME AXETIL 500 MG PO TABS: 500.0000 mg | ORAL_TABLET | Freq: Two times a day (BID) | ORAL | 0 refills | Status: AC

## 2022-02-24 ENCOUNTER — Telehealth: Payer: Self-pay

## 2022-02-24 NOTE — Telephone Encounter (Signed)
Return call to pt, no answer, left voicemail for call back.

## 2022-03-21 ENCOUNTER — Encounter: Payer: Self-pay | Admitting: *Deleted

## 2022-03-21 ENCOUNTER — Telehealth: Payer: Self-pay | Admitting: *Deleted

## 2022-03-21 ENCOUNTER — Other Ambulatory Visit: Payer: Self-pay | Admitting: *Deleted

## 2022-03-21 DIAGNOSIS — Z853 Personal history of malignant neoplasm of breast: Secondary | ICD-10-CM

## 2022-03-21 DIAGNOSIS — C50811 Malignant neoplasm of overlapping sites of right female breast: Secondary | ICD-10-CM

## 2022-03-21 DIAGNOSIS — R234 Changes in skin texture: Secondary | ICD-10-CM

## 2022-03-21 NOTE — Telephone Encounter (Signed)
Pt left VM stating she would like a return call - and that she called previously and has not heard back from this office . Per communication by My Chart this RN placed orders for diagnostic mammo and u/s per pt's new area of  concern. See below for communication.  Then ideally we would like to do a diagnostic mammogram- I will place that order and add an ultrasound order in case they feel they cannot view the area well.  It is likely scar tissue- but in this business- we are more cautious- once we can document that is what it is- then we can do screenings.   Thank you Val     Ambur S Newhard  P Onc Nurse Cc (supporting You) 5 hours ago (12:30 PM)    Hi, thank you for sending mychart message....it feels good that someone has contacted me regarding this hard place. I   I have been doing breast self exams for a long time and there is a possibility of scar tissue. This is on my nipple which is where the tumor was. I  was drying off from a shower and dragged the tower from flat on my chest up. I also felt the hard place from the nipple down.  I explained this to the breast center telephone specialist.  It had not been noticed before.    You  Skylene S Hergert 5 hours ago (12:16 PM)    Aniyha- this is Val the nurse that used to work with Dr Jana Hakim - and help now with [facility]. I tried to return your call- I do not get a verified voice mail- so some staff here may be hesitate to leave a message. I did - but your message allowance time is very short - so not sure how much I was able to state.   Saying that- it looks like in mid June Mendel Ryder order a screening mammogram.   Then on 6/30- her nurse Chan[place] tried to reach you without success.   So now hearing your issue- the question is - how long has this area of hardness been present and you have been noticing.   The Breast Center when they ask and you state something which may be very normal in a patient who has had breast surgery- they are  reluctant to do a screening.   We can order a diagnostic mammogram instead- and if area is more concerning then an U/S as well.   I hope to hear from you - I am sorry I am not always able to answer the phone when patients call.   Looking forward to  your response.   Thank  you Val RN

## 2022-03-28 ENCOUNTER — Ambulatory Visit
Admission: RE | Admit: 2022-03-28 | Discharge: 2022-03-28 | Disposition: A | Discharge status: Home / Self Care | Payer: BC Managed Care – PPO | Source: Ambulatory Visit | Attending: Adult Health | Admitting: Adult Health

## 2022-03-28 ENCOUNTER — Ambulatory Visit: Payer: BC Managed Care – PPO | Admitting: Emergency Medicine

## 2022-03-28 DIAGNOSIS — C50811 Malignant neoplasm of overlapping sites of right female breast: Secondary | ICD-10-CM

## 2022-03-28 DIAGNOSIS — R234 Changes in skin texture: Secondary | ICD-10-CM

## 2022-03-28 DIAGNOSIS — Z170 Estrogen receptor positive status [ER+]: Secondary | ICD-10-CM

## 2022-03-28 DIAGNOSIS — Z853 Personal history of malignant neoplasm of breast: Secondary | ICD-10-CM

## 2022-04-08 ENCOUNTER — Other Ambulatory Visit: Payer: Self-pay | Admitting: Emergency Medicine

## 2022-05-08 ENCOUNTER — Other Ambulatory Visit: Payer: BC Managed Care – PPO

## 2022-06-08 DIAGNOSIS — H5213 Myopia, bilateral: Secondary | ICD-10-CM | POA: Diagnosis not present

## 2022-06-08 DIAGNOSIS — H35033 Hypertensive retinopathy, bilateral: Secondary | ICD-10-CM | POA: Diagnosis not present

## 2022-06-08 DIAGNOSIS — H25813 Combined forms of age-related cataract, bilateral: Secondary | ICD-10-CM | POA: Diagnosis not present

## 2022-06-08 DIAGNOSIS — H43813 Vitreous degeneration, bilateral: Secondary | ICD-10-CM | POA: Diagnosis not present

## 2022-06-08 DIAGNOSIS — H40023 Open angle with borderline findings, high risk, bilateral: Secondary | ICD-10-CM | POA: Diagnosis not present

## 2022-06-16 ENCOUNTER — Encounter: Payer: BC Managed Care – PPO | Admitting: Adult Health

## 2022-06-28 ENCOUNTER — Other Ambulatory Visit: Payer: Self-pay | Admitting: Emergency Medicine

## 2022-06-30 ENCOUNTER — Other Ambulatory Visit: Payer: Self-pay | Admitting: Emergency Medicine

## 2022-06-30 DIAGNOSIS — G40909 Epilepsy, unspecified, not intractable, without status epilepticus: Secondary | ICD-10-CM

## 2022-07-06 DIAGNOSIS — H25813 Combined forms of age-related cataract, bilateral: Secondary | ICD-10-CM | POA: Diagnosis not present

## 2022-07-06 DIAGNOSIS — H43813 Vitreous degeneration, bilateral: Secondary | ICD-10-CM | POA: Diagnosis not present

## 2022-07-06 DIAGNOSIS — H35033 Hypertensive retinopathy, bilateral: Secondary | ICD-10-CM | POA: Diagnosis not present

## 2022-07-06 DIAGNOSIS — H40023 Open angle with borderline findings, high risk, bilateral: Secondary | ICD-10-CM | POA: Diagnosis not present

## 2022-07-17 DIAGNOSIS — H25812 Combined forms of age-related cataract, left eye: Secondary | ICD-10-CM | POA: Diagnosis not present

## 2022-07-17 DIAGNOSIS — H25813 Combined forms of age-related cataract, bilateral: Secondary | ICD-10-CM | POA: Diagnosis not present

## 2022-08-11 ENCOUNTER — Ambulatory Visit: Payer: BC Managed Care – PPO | Admitting: Obstetrics and Gynecology

## 2022-08-15 ENCOUNTER — Other Ambulatory Visit: Payer: Self-pay | Admitting: Emergency Medicine

## 2022-08-15 DIAGNOSIS — H25812 Combined forms of age-related cataract, left eye: Secondary | ICD-10-CM | POA: Diagnosis not present

## 2022-08-15 DIAGNOSIS — G40909 Epilepsy, unspecified, not intractable, without status epilepticus: Secondary | ICD-10-CM

## 2022-08-15 DIAGNOSIS — H269 Unspecified cataract: Secondary | ICD-10-CM | POA: Diagnosis not present

## 2022-08-16 ENCOUNTER — Other Ambulatory Visit: Payer: Self-pay | Admitting: Emergency Medicine

## 2022-08-16 DIAGNOSIS — G40909 Epilepsy, unspecified, not intractable, without status epilepticus: Secondary | ICD-10-CM

## 2022-08-17 NOTE — Telephone Encounter (Signed)
Pt is out of the office today pls advise on refill.Marland Kitchenlmb

## 2022-08-29 DIAGNOSIS — H25811 Combined forms of age-related cataract, right eye: Secondary | ICD-10-CM | POA: Diagnosis not present

## 2022-09-12 DIAGNOSIS — H269 Unspecified cataract: Secondary | ICD-10-CM | POA: Diagnosis not present

## 2022-09-12 DIAGNOSIS — H25811 Combined forms of age-related cataract, right eye: Secondary | ICD-10-CM | POA: Diagnosis not present

## 2022-09-16 ENCOUNTER — Other Ambulatory Visit: Payer: Self-pay | Admitting: Emergency Medicine

## 2022-10-24 DIAGNOSIS — I10 Essential (primary) hypertension: Secondary | ICD-10-CM | POA: Diagnosis not present

## 2022-10-24 DIAGNOSIS — Z713 Dietary counseling and surveillance: Secondary | ICD-10-CM | POA: Diagnosis not present

## 2022-10-24 DIAGNOSIS — Z136 Encounter for screening for cardiovascular disorders: Secondary | ICD-10-CM | POA: Diagnosis not present

## 2022-10-24 DIAGNOSIS — Z13220 Encounter for screening for lipoid disorders: Secondary | ICD-10-CM | POA: Diagnosis not present

## 2022-10-24 DIAGNOSIS — Z6837 Body mass index (BMI) 37.0-37.9, adult: Secondary | ICD-10-CM | POA: Diagnosis not present

## 2022-11-01 ENCOUNTER — Ambulatory Visit: Payer: BC Managed Care – PPO | Admitting: Obstetrics and Gynecology

## 2022-11-13 ENCOUNTER — Other Ambulatory Visit: Payer: Self-pay | Admitting: Internal Medicine

## 2022-11-13 DIAGNOSIS — G40909 Epilepsy, unspecified, not intractable, without status epilepticus: Secondary | ICD-10-CM

## 2022-12-15 ENCOUNTER — Other Ambulatory Visit: Payer: Self-pay | Admitting: *Deleted

## 2022-12-15 MED ORDER — ATORVASTATIN CALCIUM 20 MG PO TABS: 20.0000 mg | ORAL_TABLET | Freq: Every day | ORAL | 0 refills | Status: DC

## 2022-12-19 ENCOUNTER — Other Ambulatory Visit: Payer: Self-pay | Admitting: Emergency Medicine

## 2022-12-19 DIAGNOSIS — I10 Essential (primary) hypertension: Secondary | ICD-10-CM

## 2022-12-21 DIAGNOSIS — H15111 Episcleritis periodica fugax, right eye: Secondary | ICD-10-CM | POA: Diagnosis not present

## 2022-12-29 ENCOUNTER — Telehealth: Payer: Self-pay | Admitting: Emergency Medicine

## 2022-12-29 DIAGNOSIS — G40909 Epilepsy, unspecified, not intractable, without status epilepticus: Secondary | ICD-10-CM

## 2022-12-29 NOTE — Telephone Encounter (Signed)
Prescription Request  12/29/2022  LOV: 02/14/2022  What is the name of the medication or equipment? phenytoin (DILANTIN) 100 MG ER capsule   Have you contacted your pharmacy to request a refill? No   Which pharmacy would you like this sent to?     [id number] [place], [place] - [address] [place] [address] Phone: [phone] Fax: [phone] Patient notified that their request is being sent to the clinical staff for review and that they should receive a response within 2 business days.   Please advise at Mobile [phone] (mobile)

## 2022-12-30 MED ORDER — PHENYTOIN SODIUM EXTENDED 100 MG PO CAPS: 100.0000 mg | ORAL_CAPSULE | Freq: Every day | ORAL | 0 refills | Status: DC

## 2023-01-09 ENCOUNTER — Other Ambulatory Visit: Payer: Self-pay | Admitting: Cardiology

## 2023-01-10 ENCOUNTER — Other Ambulatory Visit: Payer: Self-pay | Admitting: *Deleted

## 2023-01-10 MED ORDER — ATORVASTATIN CALCIUM 20 MG PO TABS: 20.0000 mg | ORAL_TABLET | Freq: Every day | ORAL | 0 refills | Status: DC

## 2023-01-16 ENCOUNTER — Telehealth: Payer: Self-pay | Admitting: Cardiology

## 2023-01-16 MED ORDER — ATORVASTATIN CALCIUM 20 MG PO TABS: 20.0000 mg | ORAL_TABLET | Freq: Every day | ORAL | 0 refills | Status: DC

## 2023-01-16 NOTE — Telephone Encounter (Signed)
*  STAT* If patient is at the pharmacy, call can be transferred to refill team.   1. Which medications need to be refilled? (please list name of each medication and dose if known) atorvastatin (LIPITOR) 20 MG tablet   2. Which pharmacy/location (including street and city if local pharmacy) is medication to be sent to? [id number] - [place], [place] - [address]   3. Do they need a 30 day or 90 day supply? 90 day   Pt has scheduled office visit for 03/28/2023.

## 2023-01-16 NOTE — Telephone Encounter (Signed)
Pt's medication was sent to pt's pharmacy as requested. Confirmation received.  °

## 2023-01-17 MED ORDER — ATORVASTATIN CALCIUM 20 MG PO TABS: 20.0000 mg | ORAL_TABLET | Freq: Every day | ORAL | 0 refills | Status: DC

## 2023-01-17 NOTE — Addendum Note (Signed)
Addended by: Margaret Pyle D on: 01/17/2023 10:41 AM   Modules accepted: Orders

## 2023-02-15 ENCOUNTER — Other Ambulatory Visit: Payer: Self-pay | Admitting: Adult Health

## 2023-02-15 DIAGNOSIS — Z1231 Encounter for screening mammogram for malignant neoplasm of breast: Secondary | ICD-10-CM

## 2023-02-19 ENCOUNTER — Ambulatory Visit: Payer: BC Managed Care – PPO | Admitting: Emergency Medicine

## 2023-02-19 ENCOUNTER — Encounter: Payer: Self-pay | Admitting: Emergency Medicine

## 2023-02-19 VITALS — BP 130/74 | HR 94 | Temp 98.6°F | Ht 65.0 in | Wt 213.0 lb

## 2023-02-19 DIAGNOSIS — E785 Hyperlipidemia, unspecified: Secondary | ICD-10-CM

## 2023-02-19 DIAGNOSIS — I10 Essential (primary) hypertension: Secondary | ICD-10-CM | POA: Diagnosis not present

## 2023-02-19 DIAGNOSIS — G40909 Epilepsy, unspecified, not intractable, without status epilepticus: Secondary | ICD-10-CM | POA: Diagnosis not present

## 2023-02-19 DIAGNOSIS — Z170 Estrogen receptor positive status [ER+]: Secondary | ICD-10-CM

## 2023-02-19 DIAGNOSIS — C50811 Malignant neoplasm of overlapping sites of right female breast: Secondary | ICD-10-CM

## 2023-02-19 NOTE — Assessment & Plan Note (Signed)
Questionable diagnosis from many years ago Never had witnessed seizure at home or in the emergency department when she presented many years ago with a near syncopal episode. None since emergency department visit that triggered this diagnosis Has been taking Dilantin for many years.  Presently taking 100 mg daily Recommend to stop Dilantin.

## 2023-02-19 NOTE — Assessment & Plan Note (Signed)
Stable chronic condition Lipid profile done today Continue atorvastatin 20 mg daily Diet and nutrition discussed

## 2023-02-19 NOTE — Assessment & Plan Note (Signed)
Well-controlled hypertension Continue Benicar HCT 20-12.5 mg daily Cardiovascular risks associated with hypertension discussed Diet and nutrition discussed Dietary approaches to stop hypertension discussed Blood work done today Follow-up in 6 months

## 2023-02-19 NOTE — Assessment & Plan Note (Signed)
Stable.  No concerns. °

## 2023-02-19 NOTE — Patient Instructions (Signed)
Health Maintenance After Age 75 After age 75, you are at a higher risk for certain long-term diseases and infections as well as injuries from falls. Falls are a major cause of broken bones and head injuries in people who are older than age 75. Getting regular preventive care can help to keep you healthy and well. Preventive care includes getting regular testing and making lifestyle changes as recommended by your health care provider. Talk with your health care provider about: Which screenings and tests you should have. A screening is a test that checks for a disease when you have no symptoms. A diet and exercise plan that is right for you. What should I know about screenings and tests to prevent falls? Screening and testing are the best ways to find a health problem early. Early diagnosis and treatment give you the best chance of managing medical conditions that are common after age 75. Certain conditions and lifestyle choices may make you more likely to have a fall. Your health care provider may recommend: Regular vision checks. Poor vision and conditions such as cataracts can make you more likely to have a fall. If you wear glasses, make sure to get your prescription updated if your vision changes. Medicine review. Work with your health care provider to regularly review all of the medicines you are taking, including over-the-counter medicines. Ask your health care provider about any side effects that may make you more likely to have a fall. Tell your health care provider if any medicines that you take make you feel dizzy or sleepy. Strength and balance checks. Your health care provider may recommend certain tests to check your strength and balance while standing, walking, or changing positions. Foot health exam. Foot pain and numbness, as well as not wearing proper footwear, can make you more likely to have a fall. Screenings, including: Osteoporosis screening. Osteoporosis is a condition that causes  the bones to get weaker and break more easily. Blood pressure screening. Blood pressure changes and medicines to control blood pressure can make you feel dizzy. Depression screening. You may be more likely to have a fall if you have a fear of falling, feel depressed, or feel unable to do activities that you used to do. Alcohol use screening. Using too much alcohol can affect your balance and may make you more likely to have a fall. Follow these instructions at home: Lifestyle Do not drink alcohol if: Your health care provider tells you not to drink. If you drink alcohol: Limit how much you have to: 0-1 drink a day for women. 0-2 drinks a day for men. Know how much alcohol is in your drink. In the [place] one drink equals one 12 oz bottle of beer (355 mL), one 5 oz glass of wine (148 mL), or one 1 oz glass of hard liquor (44 mL). Do not use any products that contain nicotine or tobacco. These products include cigarettes, chewing tobacco, and vaping devices, such as e-cigarettes. If you need help quitting, ask your health care provider. Activity  Follow a regular exercise program to stay fit. This will help you maintain your balance. Ask your health care provider what types of exercise are appropriate for you. If you need a cane or walker, use it as recommended by your health care provider. Wear supportive shoes that have nonskid soles. Safety  Remove any tripping hazards, such as rugs, cords, and clutter. Install safety equipment such as grab bars in bathrooms and safety rails on stairs. Keep rooms and walkways   well-lit. General instructions Talk with your health care provider about your risks for falling. Tell your health care provider if: You fall. Be sure to tell your health care provider about all falls, even ones that seem minor. You feel dizzy, tiredness (fatigue), or off-balance. Take over-the-counter and prescription medicines only as told by your health care provider. These include  supplements. Eat a healthy diet and maintain a healthy weight. A healthy diet includes low-fat dairy products, low-fat (lean) meats, and fiber from whole grains, beans, and lots of fruits and vegetables. Stay current with your vaccines. Schedule regular health, dental, and eye exams. Summary Having a healthy lifestyle and getting preventive care can help to protect your health and wellness after age 75. Screening and testing are the best way to find a health problem early and help you avoid having a fall. Early diagnosis and treatment give you the best chance for managing medical conditions that are more common for people who are older than age 75. Falls are a major cause of broken bones and head injuries in people who are older than age 75. Take precautions to prevent a fall at home. Work with your health care provider to learn what changes you can make to improve your health and wellness and to prevent falls. This information is not intended to replace advice given to you by your health care provider. Make sure you discuss any questions you have with your health care provider. Document Revised: 01/03/2021 Document Reviewed: 01/03/2021 Elsevier Patient Education  2024 Elsevier Inc.  

## 2023-02-19 NOTE — Progress Notes (Signed)
Shelly Simpson 77 y.o.   Chief Complaint  Patient presents with   Medical Management of Chronic Issues    F/u appt no concerns     HISTORY OF PRESENT ILLNESS: This is a 77 y.o. female here for follow-up of multiple chronic medical conditions Overall doing well.  Has no complaints or medical concerns today.  HPI   Prior to Admission medications   Medication Sig Start Date End Date Taking? Authorizing Provider  albuterol (PROAIR HFA) 108 (90 Base) MCG/ACT inhaler Inhale 2 puffs into the lungs every 6 (six) hours as needed. 11/10/20  Yes Saraiah Bhat, Eilleen Kempf, MD  Alum Hydroxide-Mag Carbonate (GAVISCON PO) Take by mouth.   Yes [provider]  atorvastatin (LIPITOR) 20 MG tablet Take 1 tablet (20 mg total) by mouth daily. 01/17/23  Yes Meriam Sprague, MD  cetirizine (ZYRTEC) 10 MG tablet Take 10 mg by mouth daily as needed.    Yes [provider]  fluticasone (FLONASE) 50 MCG/ACT nasal spray USE 2 SPRAYS IN EACH       NOSTRIL DAILY AS NEEDED 06/20/21  Yes Zauria Dombek, Eilleen Kempf, MD  Multiple Vitamin (MULTIVITAMIN) capsule Take 1 capsule by mouth daily.   Yes [provider]  olmesartan-hydrochlorothiazide (BENICAR HCT) 20-12.5 MG tablet TAKE 1 TABLET BY MOUTH EVERY DAY 12/19/22  Yes Tamotsu Wiederholt, Eilleen Kempf, MD  calcium citrate-vitamin D (CITRACAL+D) 315-200 MG-UNIT per tablet Take 1 tablet by mouth 2 (two) times daily. Patient not taking: Reported on 02/19/2023    [provider]    Allergies  Allergen Reactions   Iohexol      Desc: pt states in '80's wheezing; dyspnea w/ contrast--pt needs pre meds in future; slg 04/24/2008    Sulfa Antibiotics     Patient Active Problem List   Diagnosis Date Noted   History of hyperglycemia 02/14/2022   Chronic pain of left knee 08/15/2021   Abnormal EKG 08/15/2021   Essential hypertension 08/10/2020   Dyslipidemia 08/10/2020   Seizure disorder (HCC) 08/10/2020   Unilateral primary osteoarthritis, left  knee 06/12/2017   Malignant neoplasm of overlapping sites of right breast in female, estrogen receptor positive (HCC) 06/25/2014   History of breast cancer    Family history of ovarian cancer    Family history of breast cancer in female     Past Medical History:  Diagnosis Date   Allergy    Breast cancer (HCC)    Cancer (HCC)    Family history of breast cancer in female    Family history of ovarian cancer    History of breast cancer    Hypertension    Seizures (HCC)     Past Surgical History:  Procedure Laterality Date   ABDOMINAL HYSTERECTOMY     BREAST LUMPECTOMY Right 2009   BREAST SURGERY     JOINT REPLACEMENT     SPINE SURGERY     laminectomy    Social History   Socioeconomic History   Marital status: Married    Spouse name: Not on file   Number of children: 2   Years of education: Not on file   Highest education level: Not on file  Occupational History   Not on file  Tobacco Use   Smoking status: Never   Smokeless tobacco: Never  Vaping Use   Vaping Use: Never used  Substance and Sexual Activity   Alcohol use: No   Drug use: No   Sexual activity: Not on file  Other Topics Concern   Not  on file  Social History Narrative   Not on file   Social Determinants of Health   Financial Resource Strain: Low Risk  (02/16/2023)   Overall Financial Resource Strain (CARDIA)    Difficulty of Paying Living Expenses: Not hard at all  Food Insecurity: No Food Insecurity (02/16/2023)   Hunger Vital Sign    Worried About Running Out of Food in the Last Year: Never true    Ran Out of Food in the Last Year: Never true  Transportation Needs: No Transportation Needs (02/16/2023)   PRAPARE - Administrator, Civil Service (Medical): No    Lack of Transportation (Non-Medical): No  Physical Activity: Unknown (02/16/2023)   Exercise Vital Sign    Days of Exercise per Week: 3 days    Minutes of Exercise per Session: Not on file  Stress: No Stress Concern Present  (02/16/2023)   [employer] of Occupational Health - Occupational Stress Questionnaire    Feeling of Stress : Not at all  Social Connections: Unknown (02/16/2023)   Social Connection and Isolation Panel [NHANES]    Frequency of Communication with Friends and Family: More than three times a week    Frequency of Social Gatherings with Friends and Family: Patient declined    Attends Religious Services: Patient declined    Database administrator or Organizations: Yes    Attends Engineer, structural: More than 4 times per year    Marital Status: Married  Catering manager Violence: Not on file    Family History  Problem Relation Age of Onset   Ovarian cancer Mother 18       also uterine sarcoma   Other Father    Breast cancer Daughter 52   Breast cancer Maternal Aunt    Breast cancer Maternal Uncle        female breast cancer; deceased 84   Cancer Maternal Grandmother    Heart disease Maternal Grandfather    Hypertension Maternal Grandfather    Hypertension Paternal Grandfather    Breast cancer Cousin 41       Currently 68     Review of Systems  Constitutional: Negative.  Negative for chills and fever.  HENT: Negative.  Negative for congestion and sore throat.   Respiratory: Negative.  Negative for cough and shortness of breath.   Cardiovascular: Negative.  Negative for chest pain and palpitations.  Gastrointestinal:  Negative for abdominal pain, diarrhea, nausea and vomiting.  Genitourinary: Negative.  Negative for dysuria and hematuria.  Skin: Negative.  Negative for rash.  Neurological: Negative.  Negative for dizziness and headaches.  All other systems reviewed and are negative.   Vitals:   02/19/23 0811  BP: 130/74  Pulse: 94  Temp: 98.6 F (37 C)  SpO2: 96%    Physical Exam Vitals reviewed.  Constitutional:      Appearance: Normal appearance.  HENT:     Head: Normocephalic.     Right Ear: Tympanic membrane, ear canal and external ear normal.      Left Ear: Tympanic membrane, ear canal and external ear normal.     Mouth/Throat:     Mouth: Mucous membranes are dry.     Pharynx: Oropharynx is clear.  Eyes:     Extraocular Movements: Extraocular movements intact.     Pupils: Pupils are equal, round, and reactive to light.     Comments: Mild conjunctival injection right eye  Cardiovascular:     Rate and Rhythm: Normal rate and regular rhythm.  Pulses: Normal pulses.     Heart sounds: Normal heart sounds.  Pulmonary:     Effort: Pulmonary effort is normal.     Breath sounds: Normal breath sounds.  Abdominal:     Palpations: Abdomen is soft.     Tenderness: There is no abdominal tenderness.  Musculoskeletal:     Cervical back: No tenderness.     Right lower leg: No edema.     Left lower leg: No edema.  Lymphadenopathy:     Cervical: No cervical adenopathy.  Skin:    General: Skin is warm and dry.  Neurological:     General: No focal deficit present.     Mental Status: She is alert and oriented to person, place, and time.  Psychiatric:        Mood and Affect: Mood normal.        Behavior: Behavior normal.      ASSESSMENT & PLAN: A total of 45 minutes was spent with the patient and counseling/coordination of care regarding preparing for this visit, review of most recent office visit notes, review of multiple chronic medical conditions under management, review of all medications and changes made, education on nutrition, cardiovascular risks associated with hypertension and dyslipidemia, review of health maintenance items, prognosis, documentation and need for follow-up.  Problem List Items Addressed This Visit       Cardiovascular and Mediastinum   Essential hypertension - Primary    Well-controlled hypertension Continue Benicar HCT 20-12.5 mg daily Cardiovascular risks associated with hypertension discussed Diet and nutrition discussed Dietary approaches to stop hypertension discussed Blood work done  today Follow-up in 6 months        Nervous and Auditory   Seizure disorder (HCC)    Questionable diagnosis from many years ago Never had witnessed seizure at home or in the emergency department when she presented many years ago with a near syncopal episode. None since emergency department visit that triggered this diagnosis Has been taking Dilantin for many years.  Presently taking 100 mg daily Recommend to stop Dilantin.        Other   Malignant neoplasm of overlapping sites of right breast in female, estrogen receptor positive (HCC)    Stable.  No concerns.      Dyslipidemia    Stable chronic condition Lipid profile done today Continue atorvastatin 20 mg daily Diet and nutrition discussed       Patient Instructions  Health Maintenance After Age 66 After age 70, you are at a higher risk for certain long-term diseases and infections as well as injuries from falls. Falls are a major cause of broken bones and head injuries in people who are older than age 18. Getting regular preventive care can help to keep you healthy and well. Preventive care includes getting regular testing and making lifestyle changes as recommended by your health care provider. Talk with your health care provider about: Which screenings and tests you should have. A screening is a test that checks for a disease when you have no symptoms. A diet and exercise plan that is right for you. What should I know about screenings and tests to prevent falls? Screening and testing are the best ways to find a health problem early. Early diagnosis and treatment give you the best chance of managing medical conditions that are common after age 65. Certain conditions and lifestyle choices may make you more likely to have a fall. Your health care provider may recommend: Regular vision checks. Poor vision and conditions  such as cataracts can make you more likely to have a fall. If you wear glasses, make sure to get your  prescription updated if your vision changes. Medicine review. Work with your health care provider to regularly review all of the medicines you are taking, including over-the-counter medicines. Ask your health care provider about any side effects that may make you more likely to have a fall. Tell your health care provider if any medicines that you take make you feel dizzy or sleepy. Strength and balance checks. Your health care provider may recommend certain tests to check your strength and balance while standing, walking, or changing positions. Foot health exam. Foot pain and numbness, as well as not wearing proper footwear, can make you more likely to have a fall. Screenings, including: Osteoporosis screening. Osteoporosis is a condition that causes the bones to get weaker and break more easily. Blood pressure screening. Blood pressure changes and medicines to control blood pressure can make you feel dizzy. Depression screening. You may be more likely to have a fall if you have a fear of falling, feel depressed, or feel unable to do activities that you used to do. Alcohol use screening. Using too much alcohol can affect your balance and may make you more likely to have a fall. Follow these instructions at home: Lifestyle Do not drink alcohol if: Your health care provider tells you not to drink. If you drink alcohol: Limit how much you have to: 0-1 drink a day for women. 0-2 drinks a day for men. Know how much alcohol is in your drink. In the [place] one drink equals one 12 oz bottle of beer (355 mL), one 5 oz glass of wine (148 mL), or one 1 oz glass of hard liquor (44 mL). Do not use any products that contain nicotine or tobacco. These products include cigarettes, chewing tobacco, and vaping devices, such as e-cigarettes. If you need help quitting, ask your health care provider. Activity  Follow a regular exercise program to stay fit. This will help you maintain your balance. Ask your health  care provider what types of exercise are appropriate for you. If you need a cane or walker, use it as recommended by your health care provider. Wear supportive shoes that have nonskid soles. Safety  Remove any tripping hazards, such as rugs, cords, and clutter. Install safety equipment such as grab bars in bathrooms and safety rails on stairs. Keep rooms and walkways well-lit. General instructions Talk with your health care provider about your risks for falling. Tell your health care provider if: You fall. Be sure to tell your health care provider about all falls, even ones that seem minor. You feel dizzy, tiredness (fatigue), or off-balance. Take over-the-counter and prescription medicines only as told by your health care provider. These include supplements. Eat a healthy diet and maintain a healthy weight. A healthy diet includes low-fat dairy products, low-fat (lean) meats, and fiber from whole grains, beans, and lots of fruits and vegetables. Stay current with your vaccines. Schedule regular health, dental, and eye exams. Summary Having a healthy lifestyle and getting preventive care can help to protect your health and wellness after age 12. Screening and testing are the best way to find a health problem early and help you avoid having a fall. Early diagnosis and treatment give you the best chance for managing medical conditions that are more common for people who are older than age 28. Falls are a major cause of broken bones and head injuries in people  who are older than age 58. Take precautions to prevent a fall at home. Work with your health care provider to learn what changes you can make to improve your health and wellness and to prevent falls. This information is not intended to replace advice given to you by your health care provider. Make sure you discuss any questions you have with your health care provider. Document Revised: 01/03/2021 Document Reviewed: 01/03/2021 Elsevier  Patient Education  2024 Elsevier Inc.      Edwina Barth, MD [facility]

## 2023-03-21 ENCOUNTER — Encounter: Payer: Self-pay | Admitting: Emergency Medicine

## 2023-03-21 NOTE — Telephone Encounter (Signed)
Use it to establish with a new provider.

## 2023-03-27 NOTE — Progress Notes (Unsigned)
Cardiology Office Note:  .   Date:  03/28/2023  ID:  [name], DOB 1946/04/26, MRN [id number] PCP: Georgina Quint, MD  [facility] Providers Cardiologist:  None {  History of Present Illness: Shelly Simpson Kitchen   Shelly Simpson is a 77 y.o. female with a past medical history of hypertension, seizure order and breast cancer who was referred by PCP for further evaluation of abnormal's EKG.  Patient was seen 12/22 in the ER after episode of sharp chest pain at home found to have EKG with normal sinus rhythm, possible inferior Q waves and poor R wave progression concerning for possible prior MI.  In the ER, troponins were negative and CXR without acute pathology.  Discharged home with plans to follow-up with cardiology for further management.  She was last seen March 2023 and at that time she felt fine.  Denied any prior MI or heart issues.  Currently not experiencing any cardiovascular symptoms.  She stated that in the ER, the person performing the EKG told her they placed the pads on wrong which may have caused the abnormality seen.  Inferior Q waves are not present on the current tracing.  Otherwise, denies any chest pain, shortness of breath, lightheadedness, lower extremity edema, orthopnea.  Typically she is an active person.  Limited by knee pain which she attributes to prior total knee arthroplasty.  Has history of breast cancer in 2009 which was treated with chemotherapy and radiation.  She was in remission and not taking any medication.  In her family, alcohol also had breast cancer.  At her last appointment she denied palpitations, chest pain, shortness of breath, peripheral edema.  No lightheadedness, headaches, syncope, orthopnea, or PND.  She confirmed that her cholesterol has not been checked recently.  Currently, taking atorvastatin and co-Q10.  Today, she tells me that she is making it day by day.  She had her right knee replaced and her left knee was giving her some issues so  she was given a shot (does not know if this is a steroid or gel shot).  Feeling much better.  She has been compliant with her medications and she is taking co-Q10.  LDL is 88 without history of CKD.  At goal.  She states that the EKG discrepancy listed in her chart was due to the incorrect placement of the stickers and that is why she had an abnormal EKG.  EKG stable today.  No chest pain, shortness of breath, palpitations.  Reports no shortness of breath nor dyspnea on exertion. Reports no chest pain, pressure, or tightness. No edema, orthopnea, PND. Reports no palpitations.   ROS: Pertinent ROS in HPI.  Studies Reviewed: Shelly Simpson Kitchen   EKG Interpretation Date/Time:  Wednesday March 28 2023 08:23:17 EDT Ventricular Rate:  85 PR Interval:  178 QRS Duration:  90 QT Interval:  364 QTC Calculation: 433 R Axis:   -50  Text Interpretation: Normal sinus rhythm Left axis deviation When compared with ECG of 11-Aug-2021 17:06, Confirmed by Jari Favre [phone]) on 03/28/2023 8:50:27 AM   CT cardiac scoring 11/21/21 IMPRESSION: Coronary calcium score of 240 Agatston units. This was 79th percentile for age-, race-, and sex-matched controls.      Physical Exam:   VS:  BP 122/62   Pulse 85   Ht 5\' 5"  (1.651 m)   Wt 209 lb 6.4 oz (95 kg)   SpO2 96%   BMI 34.85 kg/m    Wt Readings from Last 3 Encounters:  03/28/23 209  lb 6.4 oz (95 kg)  02/19/23 213 lb (96.6 kg)  02/14/22 228 lb (103.4 kg)    GEN: Well nourished, well developed in no acute distress NECK: No JVD; No carotid bruits CARDIAC: RRR, no murmurs, rubs, gallops RESPIRATORY:  Clear to auscultation without rales, wheezing or rhonchi  ABDOMEN: Soft, non-tender, non-distended EXTREMITIES:  No edema; No deformity   ASSESSMENT AND PLAN: .    1.  Dyslipidemia -Continue current medications which include Lipitor 80 mg daily, Zetia 10 mg daily -recent lab work fall off last year showed LDL 88, at goal -continue lipid lowering diet  3.  History of  chemotherapy/Breast CA -In remission but still getting a yearly follow-up appointment  4.  Medical management -Continue current medications without any changes today  5.  Hypertension -Blood pressure well-controlled today, 122/62 -Continue current medications which include Benicar/HCTZ 20-12.5 mg daily -Continue to keep a blood pressure log at home      Dispo: She can follow-up in a year  Signed, Sharlene Dory, PA-C

## 2023-03-28 ENCOUNTER — Ambulatory Visit: Payer: BC Managed Care – PPO | Attending: Physician Assistant | Admitting: Physician Assistant

## 2023-03-28 ENCOUNTER — Encounter: Payer: Self-pay | Admitting: Physician Assistant

## 2023-03-28 VITALS — BP 122/62 | HR 85 | Ht 65.0 in | Wt 209.4 lb

## 2023-03-28 DIAGNOSIS — R9431 Abnormal electrocardiogram [ECG] [EKG]: Secondary | ICD-10-CM | POA: Diagnosis not present

## 2023-03-28 DIAGNOSIS — Z79899 Other long term (current) drug therapy: Secondary | ICD-10-CM

## 2023-03-28 DIAGNOSIS — Z853 Personal history of malignant neoplasm of breast: Secondary | ICD-10-CM

## 2023-03-28 DIAGNOSIS — Z9221 Personal history of antineoplastic chemotherapy: Secondary | ICD-10-CM

## 2023-03-28 DIAGNOSIS — I10 Essential (primary) hypertension: Secondary | ICD-10-CM

## 2023-03-28 DIAGNOSIS — E785 Hyperlipidemia, unspecified: Secondary | ICD-10-CM

## 2023-03-28 MED ORDER — ATORVASTATIN CALCIUM 20 MG PO TABS: 20.0000 mg | ORAL_TABLET | Freq: Every day | ORAL | 3 refills | Status: DC

## 2023-03-28 MED ORDER — OLMESARTAN MEDOXOMIL-HCTZ 20-12.5 MG PO TABS
1.0000 | ORAL_TABLET | Freq: Every day | ORAL | 3 refills | Status: DC
Start: 2023-03-28 — End: 2024-04-29

## 2023-03-28 NOTE — Patient Instructions (Signed)
Medication Instructions:  Your physician recommends that you continue on your current medications as directed. Please refer to the Current Medication list given to you today.  *If you need a refill on your cardiac medications before your next appointment, please call your pharmacy*   Lab Work: None ordered today.  NEXT TIME YOU HAVE LABS AT YOUR PRIMARY CARE'S OFFICE, PLEASE HAVE THEM FAX [place] A COPY.  If you have labs (blood work) drawn today and your tests are completely normal, you will receive your results only by: MyChart Message (if you have MyChart) OR A paper copy in the mail If you have any lab test that is abnormal or we need to change your treatment, we will call you to review the results.   Testing/Procedures: None ordered   Follow-Up: At [facility], you and your health needs are our priority.  As part of our continuing mission to provide you with exceptional heart care, we have created designated Provider Care Teams.  These Care Teams include your primary Cardiologist (physician) and Advanced Practice Providers (APPs -  Physician Assistants and Nurse Practitioners) who all work together to provide you with the care you need, when you need it.  We recommend signing up for the patient portal called "MyChart".  Sign up information is provided on this After Visit Summary.  MyChart is used to connect with patients for Virtual Visits (Telemedicine).  Patients are able to view lab/test results, encounter notes, upcoming appointments, etc.  Non-urgent messages can be sent to your provider as well.   To learn more about what you can do with MyChart, go to [email or web address].    Your next appointment:   1 year(s)  Provider:   Thomasene Ripple, MD     Other Instructions Low-Sodium Eating Plan Salt (sodium) helps you keep a healthy balance of fluids in your body. Too much sodium can raise your blood pressure. It can also cause fluid and waste to be held in your body. Your  health care provider or dietitian may recommend a low-sodium eating plan if you have high blood pressure (hypertension), kidney disease, liver disease, or heart failure. Eating less sodium can help lower your blood pressure and reduce swelling. It can also protect your heart, liver, and kidneys. What are tips for following this plan? Reading food labels  Check food labels for the amount of sodium per serving. If you eat more than one serving, you must multiply the listed amount by the number of servings. Choose foods with less than 140 milligrams (mg) of sodium per serving. Avoid foods with 300 mg of sodium or more per serving. Always check how much sodium is in a product, even if the label says "unsalted" or "no salt added." Shopping  Buy products labeled as "low-sodium" or "no salt added." Buy fresh foods. Avoid canned foods and pre-made or frozen meals. Avoid canned, cured, or processed meats. Buy breads that have less than 80 mg of sodium per slice. Cooking  Eat more home-cooked food. Try to eat less restaurant, buffet, and fast food. Try not to add salt when you cook. Use salt-free seasonings or herbs instead of table salt or sea salt. Check with your provider or pharmacist before using salt substitutes. Cook with plant-based oils, such as canola, sunflower, or olive oil. Meal planning When eating at a restaurant, ask if your food can be made with less salt or no salt. Avoid dishes labeled as brined, pickled, cured, or smoked. Avoid dishes made with soy sauce, miso,  or teriyaki sauce. Avoid foods that have monosodium glutamate (MSG) in them. MSG may be added to some restaurant food, sauces, soups, bouillon, and canned foods. Make meals that can be grilled, baked, poached, roasted, or steamed. These are often made with less sodium. General information Try to limit your sodium intake to 1,500-2,300 mg each day, or the amount told by your provider. What foods should I eat? Fruits Fresh,  frozen, or canned fruit. Fruit juice. Vegetables Fresh or frozen vegetables. "No salt added" canned vegetables. "No salt added" tomato sauce and paste. Low-sodium or reduced-sodium tomato and vegetable juice. Grains Low-sodium cereals, such as oats, puffed wheat and rice, and shredded wheat. Low-sodium crackers. Unsalted rice. Unsalted pasta. Low-sodium bread. Whole grain breads and whole grain pasta. Meats and other proteins Fresh or frozen meat, poultry, seafood, and fish. These should have no added salt. Low-sodium canned tuna and salmon. Unsalted nuts. Dried peas, beans, and lentils without added salt. Unsalted canned beans. Eggs. Unsalted nut butters. Dairy Milk. Soy milk. Cheese that is naturally low in sodium, such as ricotta cheese, fresh mozzarella, or Swiss cheese. Low-sodium or reduced-sodium cheese. Cream cheese. Yogurt. Seasonings and condiments Fresh and dried herbs and spices. Salt-free seasonings. Low-sodium mustard and ketchup. Sodium-free salad dressing. Sodium-free light mayonnaise. Fresh or refrigerated horseradish. Lemon juice. Vinegar. Other foods Homemade, reduced-sodium, or low-sodium soups. Unsalted popcorn and pretzels. Low-salt or salt-free chips. The items listed above may not be all the foods and drinks you can have. Talk to a dietitian to learn more. What foods should I avoid? Vegetables Sauerkraut, pickled vegetables, and relishes. Olives. [place] fries. Onion rings. Regular canned vegetables, except low-sodium or reduced-sodium items. Regular canned tomato sauce and paste. Regular tomato and vegetable juice. Frozen vegetables in sauces. Grains Instant hot cereals. Bread stuffing, pancake, and biscuit mixes. Croutons. Seasoned rice or pasta mixes. Noodle soup cups. Boxed or frozen macaroni and cheese. Regular salted crackers. Self-rising flour. Meats and other proteins Meat or fish that is salted, canned, smoked, spiced, or pickled. Precooked or cured meat, such as  sausages or meat loaves. Shelly Simpson. Ham. Pepperoni. Hot dogs. Corned beef. Chipped beef. Salt pork. Jerky. Pickled herring, anchovies, and sardines. Regular canned tuna. Salted nuts. Dairy Processed cheese and cheese spreads. Hard cheeses. Cheese curds. Blue cheese. Feta cheese. String cheese. Regular cottage cheese. Buttermilk. Canned milk. Fats and oils Salted butter. Regular margarine. Ghee. Bacon fat. Seasonings and condiments Onion salt, garlic salt, seasoned salt, table salt, and sea salt. Canned and packaged gravies. Worcestershire sauce. Tartar sauce. Barbecue sauce. Teriyaki sauce. Soy sauce, including reduced-sodium soy sauce. Steak sauce. Fish sauce. Oyster sauce. Cocktail sauce. Horseradish that you find on the shelf. Regular ketchup and mustard. Meat flavorings and tenderizers. Bouillon cubes. Hot sauce. Pre-made or packaged marinades. Pre-made or packaged taco seasonings. Relishes. Regular salad dressings. Salsa. Other foods Salted popcorn and pretzels. Corn chips and puffs. Potato and tortilla chips. Canned or dried soups. Pizza. Frozen entrees and pot pies. The items listed above may not be all the foods and drinks you should avoid. Talk to a dietitian to learn more. This information is not intended to replace advice given to you by your health care provider. Make sure you discuss any questions you have with your health care provider. Document Revised: 08/31/2022 Document Reviewed: 08/31/2022 Elsevier Patient Education  2024 [employer]   Heart-Healthy Eating Plan Many factors influence your heart health, including eating and exercise habits. Heart health is also called coronary health. Coronary risk increases with abnormal  blood fat (lipid) levels. A heart-healthy eating plan includes limiting unhealthy fats, increasing healthy fats, limiting salt (sodium) intake, and making other diet and lifestyle changes. What is my plan? Your health care provider may recommend that: You limit  your fat intake to _________% or less of your total calories each day. You limit your saturated fat intake to _________% or less of your total calories each day. You limit the amount of cholesterol in your diet to less than _________ mg per day. You limit the amount of sodium in your diet to less than _________ mg per day. What are tips for following this plan? Cooking Cook foods using methods other than frying. Baking, boiling, grilling, and broiling are all good options. Other ways to reduce fat include: Removing the skin from poultry. Removing all visible fats from meats. Steaming vegetables in water or broth. Meal planning  At meals, imagine dividing your plate into fourths: Fill one-half of your plate with vegetables and green salads. Fill one-fourth of your plate with whole grains. Fill one-fourth of your plate with lean protein foods. Eat 2-4 cups of vegetables per day. One cup of vegetables equals 1 cup (91 g) broccoli or cauliflower florets, 2 medium carrots, 1 large bell pepper, 1 large sweet potato, 1 large tomato, 1 medium white potato, 2 cups (150 g) raw leafy greens. Eat 1-2 cups of fruit per day. One cup of fruit equals 1 small apple, 1 large banana, 1 cup (237 g) mixed fruit, 1 large orange,  cup (82 g) dried fruit, 1 cup (240 mL) 100% fruit juice. Eat more foods that contain soluble fiber. Examples include apples, broccoli, carrots, beans, peas, and barley. Aim to get 25-30 g of fiber per day. Increase your consumption of legumes, nuts, and seeds to 4-5 servings per week. One serving of dried beans or legumes equals  cup (90 g) cooked, 1 serving of nuts is  oz (12 almonds, 24 pistachios, or 7 walnut halves), and 1 serving of seeds equals  oz (8 g). Fats Choose healthy fats more often. Choose monounsaturated and polyunsaturated fats, such as olive and canola oils, avocado oil, flaxseeds, walnuts, almonds, and seeds. Eat more omega-3 fats. Choose salmon, mackerel,  sardines, tuna, flaxseed oil, and ground flaxseeds. Aim to eat fish at least 2 times each week. Check food labels carefully to identify foods with trans fats or high amounts of saturated fat. Limit saturated fats. These are found in animal products, such as meats, butter, and cream. Plant sources of saturated fats include palm oil, palm kernel oil, and coconut oil. Avoid foods with partially hydrogenated oils in them. These contain trans fats. Examples are stick margarine, some tub margarines, cookies, crackers, and other baked goods. Avoid fried foods. General information Eat more home-cooked food and less restaurant, buffet, and fast food. Limit or avoid alcohol. Limit foods that are high in added sugar and simple starches such as foods made using white refined flour (white breads, pastries, sweets). Lose weight if you are overweight. Losing just 5-10% of your body weight can help your overall health and prevent diseases such as diabetes and heart disease. Monitor your sodium intake, especially if you have high blood pressure. Talk with your health care provider about your sodium intake. Try to incorporate more vegetarian meals weekly. What foods should I eat? Fruits All fresh, canned (in natural juice), or frozen fruits. Vegetables Fresh or frozen vegetables (raw, steamed, roasted, or grilled). Green salads. Grains Most grains. Choose whole wheat and whole grains  most of the time. Rice and pasta, including brown rice and pastas made with whole wheat. Meats and other proteins Lean, well-trimmed beef, veal, pork, and lamb. Chicken and [place] without skin. All fish and shellfish. Wild duck, rabbit, pheasant, and venison. Egg whites or low-cholesterol egg substitutes. Dried beans, peas, lentils, and tofu. Seeds and most nuts. Dairy Low-fat or nonfat cheeses, including ricotta and mozzarella. Skim or 1% milk (liquid, powdered, or evaporated). Buttermilk made with low-fat milk. Nonfat or low-fat  yogurt. Fats and oils Non-hydrogenated (trans-free) margarines. Vegetable oils, including soybean, sesame, sunflower, olive, avocado, peanut, safflower, corn, canola, and cottonseed. Salad dressings or mayonnaise made with a vegetable oil. Beverages Water (mineral or sparkling). Coffee and tea. Unsweetened ice tea. Diet beverages. Sweets and desserts Sherbet, gelatin, and fruit ice. Small amounts of dark chocolate. Limit all sweets and desserts. Seasonings and condiments All seasonings and condiments. The items listed above may not be a complete list of foods and beverages you can eat. Contact a dietitian for more options. What foods should I avoid? Fruits Canned fruit in heavy syrup. Fruit in cream or butter sauce. Fried fruit. Limit coconut. Vegetables Vegetables cooked in cheese, cream, or butter sauce. Fried vegetables. Grains Breads made with saturated or trans fats, oils, or whole milk. Croissants. Sweet rolls. Donuts. High-fat crackers, such as cheese crackers and chips. Meats and other proteins Fatty meats, such as hot dogs, ribs, sausage, bacon, rib-eye roast or steak. High-fat deli meats, such as salami and bologna. Caviar. Domestic duck and goose. Organ meats, such as liver. Dairy Cream, sour cream, cream cheese, and creamed cottage cheese. Whole-milk cheeses. Whole or 2% milk (liquid, evaporated, or condensed). Whole buttermilk. Cream sauce or high-fat cheese sauce. Whole-milk yogurt. Fats and oils Meat fat, or shortening. Cocoa butter, hydrogenated oils, palm oil, coconut oil, palm kernel oil. Solid fats and shortenings, including bacon fat, salt pork, lard, and butter. Nondairy cream substitutes. Salad dressings with cheese or sour cream. Beverages Regular sodas and any drinks with added sugar. Sweets and desserts Frosting. Pudding. Cookies. Cakes. Pies. Milk chocolate or white chocolate. Buttered syrups. Full-fat ice cream or ice cream drinks. The items listed above may not  be a complete list of foods and beverages to avoid. Contact a dietitian for more information. Summary Heart-healthy meal planning includes limiting unhealthy fats, increasing healthy fats, limiting salt (sodium) intake and making other diet and lifestyle changes. Lose weight if you are overweight. Losing just 5-10% of your body weight can help your overall health and prevent diseases such as diabetes and heart disease. Focus on eating a balance of foods, including fruits and vegetables, low-fat or nonfat dairy, lean protein, nuts and legumes, whole grains, and heart-healthy oils and fats. This information is not intended to replace advice given to you by your health care provider. Make sure you discuss any questions you have with your health care provider. Document Revised: 09/19/2021 Document Reviewed: 09/19/2021 Elsevier Patient Education  2024 [employer].

## 2023-03-30 ENCOUNTER — Ambulatory Visit: Admission: RE | Admit: 2023-03-30 | Payer: BC Managed Care – PPO | Source: Ambulatory Visit

## 2023-03-30 DIAGNOSIS — Z1231 Encounter for screening mammogram for malignant neoplasm of breast: Secondary | ICD-10-CM

## 2023-03-30 HISTORY — DX: Personal history of antineoplastic chemotherapy: Z92.21

## 2023-03-30 HISTORY — DX: Personal history of irradiation: Z92.3

## 2023-04-18 DIAGNOSIS — H43813 Vitreous degeneration, bilateral: Secondary | ICD-10-CM | POA: Diagnosis not present

## 2023-04-18 DIAGNOSIS — H40023 Open angle with borderline findings, high risk, bilateral: Secondary | ICD-10-CM | POA: Diagnosis not present

## 2023-04-18 DIAGNOSIS — H15111 Episcleritis periodica fugax, right eye: Secondary | ICD-10-CM | POA: Diagnosis not present

## 2023-04-18 DIAGNOSIS — H35033 Hypertensive retinopathy, bilateral: Secondary | ICD-10-CM | POA: Diagnosis not present

## 2023-07-17 ENCOUNTER — Other Ambulatory Visit: Payer: Self-pay | Admitting: Emergency Medicine

## 2023-07-18 ENCOUNTER — Other Ambulatory Visit: Payer: Self-pay | Admitting: Emergency Medicine

## 2023-07-18 ENCOUNTER — Telehealth: Payer: Self-pay | Admitting: Emergency Medicine

## 2023-07-18 NOTE — Telephone Encounter (Signed)
She should not be taking metformin.  Entry error.

## 2023-07-18 NOTE — Telephone Encounter (Signed)
Not sure why she is on metformin.  She is not diabetic.  Who prescribed this medication for her?

## 2023-07-18 NOTE — Telephone Encounter (Signed)
Pt call asking why she is getting prescribed metformin when she is not an diabetic. Please advise.

## 2023-07-20 NOTE — Telephone Encounter (Signed)
Spoke with patient and she understood it was a mistake and is going to take it back to the pharmacy

## 2023-09-18 ENCOUNTER — Ambulatory Visit: Payer: BC Managed Care – PPO | Admitting: Emergency Medicine

## 2023-09-18 ENCOUNTER — Encounter: Payer: Self-pay | Admitting: Emergency Medicine

## 2023-09-18 VITALS — BP 124/74 | HR 105 | Temp 98.0°F | Ht 65.0 in | Wt 209.0 lb

## 2023-09-18 DIAGNOSIS — Z170 Estrogen receptor positive status [ER+]: Secondary | ICD-10-CM

## 2023-09-18 DIAGNOSIS — I10 Essential (primary) hypertension: Secondary | ICD-10-CM

## 2023-09-18 DIAGNOSIS — J0100 Acute maxillary sinusitis, unspecified: Secondary | ICD-10-CM

## 2023-09-18 DIAGNOSIS — C50811 Malignant neoplasm of overlapping sites of right female breast: Secondary | ICD-10-CM | POA: Diagnosis not present

## 2023-09-18 DIAGNOSIS — E785 Hyperlipidemia, unspecified: Secondary | ICD-10-CM | POA: Diagnosis not present

## 2023-09-18 LAB — CBC WITH DIFFERENTIAL/PLATELET
Basophils Absolute: 0.1 10*3/uL (ref 0.0–0.1)
Basophils Relative: 1 % (ref 0.0–3.0)
Eosinophils Absolute: 0 10*3/uL (ref 0.0–0.7)
Eosinophils Relative: 0.1 % (ref 0.0–5.0)
HCT: 45.7 % (ref 36.0–46.0)
Hemoglobin: 14.8 g/dL (ref 12.0–15.0)
Lymphocytes Relative: 16.3 % (ref 12.0–46.0)
Lymphs Abs: 1.6 10*3/uL (ref 0.7–4.0)
MCHC: 32.4 g/dL (ref 30.0–36.0)
MCV: 94 fL (ref 78.0–100.0)
Monocytes Absolute: 0.5 10*3/uL (ref 0.1–1.0)
Monocytes Relative: 4.9 % (ref 3.0–12.0)
Neutro Abs: 7.7 10*3/uL (ref 1.4–7.7)
Neutrophils Relative %: 77.7 % — ABNORMAL HIGH (ref 43.0–77.0)
Platelets: 291 10*3/uL (ref 150.0–400.0)
RBC: 4.86 Mil/uL (ref 3.87–5.11)
RDW: 12.5 % (ref 11.5–15.5)
WBC: 9.9 10*3/uL (ref 4.0–10.5)

## 2023-09-18 LAB — LIPID PANEL
Cholesterol: 141 mg/dL (ref 0–200)
HDL: 49.4 mg/dL (ref >39.00)
LDL Cholesterol: 63 mg/dL (ref 0–99)
NonHDL: 91.15
Total CHOL/HDL Ratio: 3
Triglycerides: 143 mg/dL (ref 0.0–149.0)
VLDL: 28.6 mg/dL (ref 0.0–40.0)

## 2023-09-18 LAB — COMPREHENSIVE METABOLIC PANEL
ALT: 19 U/L (ref 0–35)
AST: 17 U/L (ref 0–37)
Albumin: 4.4 g/dL (ref 3.5–5.2)
Alkaline Phosphatase: 59 U/L (ref 39–117)
BUN: 17 mg/dL (ref 6–23)
CO2: 28 meq/L (ref 19–32)
Calcium: 10.1 mg/dL (ref 8.4–10.5)
Chloride: 104 meq/L (ref 96–112)
Creatinine, Ser: 0.95 mg/dL (ref 0.40–1.20)
GFR: 58.72 mL/min — ABNORMAL LOW (ref >60.00)
Glucose, Bld: 162 mg/dL — ABNORMAL HIGH (ref 70–99)
Potassium: 4.3 meq/L (ref 3.5–5.1)
Sodium: 141 meq/L (ref 135–145)
Total Bilirubin: 0.6 mg/dL (ref 0.2–1.2)
Total Protein: 6.9 g/dL (ref 6.0–8.3)

## 2023-09-18 LAB — HEMOGLOBIN A1C: Hgb A1c MFr Bld: 5.8 % (ref 4.6–6.5)

## 2023-09-18 MED ORDER — AMOXICILLIN 500 MG PO CAPS
500.0000 mg | ORAL_CAPSULE | Freq: Three times a day (TID) | ORAL | 0 refills | Status: AC
Start: 2023-09-18 — End: 2023-09-25

## 2023-09-18 NOTE — Progress Notes (Signed)
Shelly Simpson 78 y.o.   Chief Complaint  Patient presents with   Nasal Congestion    Patient states taking an old amoxicillin yesterday to help. Had been dealing the stuffiness for awhile and has been taking allergy meds and has not helped     HISTORY OF PRESENT ILLNESS: This is a 78 y.o. female here for 63-month follow-up of chronic medical conditions Also complaining of nasal congestion and possible sinus infection.  Started taking amoxicillin yesterday, leftover medication from prior episode. No other complaints or medical concerns today.  HPI   Prior to Admission medications   Medication Sig Start Date End Date Taking? Authorizing Provider  atorvastatin (LIPITOR) 20 MG tablet Take 1 tablet (20 mg total) by mouth daily. 03/28/23  Yes Conte, Tessa N, PA-C  cetirizine (ZYRTEC) 10 MG tablet Take 10 mg by mouth daily as needed.    Yes [provider]  co-enzyme Q-10 30 MG capsule Take 30 mg by mouth daily.   Yes [provider]  Multiple Vitamin (MULTIVITAMIN) capsule Take 1 capsule by mouth daily.   Yes [provider]  olmesartan-hydrochlorothiazide (BENICAR HCT) 20-12.5 MG tablet Take 1 tablet by mouth daily. 03/28/23  Yes Conte, Tessa N, PA-C  albuterol (PROAIR HFA) 108 (90 Base) MCG/ACT inhaler Inhale 2 puffs into the lungs every 6 (six) hours as needed. Patient not taking: Reported on 09/18/2023 11/10/20   Georgina Quint, MD  Alum Hydroxide-Mag Carbonate (GAVISCON PO) Take by mouth. Patient not taking: Reported on 09/18/2023    [provider]  fluticasone (FLONASE) 50 MCG/ACT nasal spray USE 2 SPRAYS IN EACH       NOSTRIL DAILY AS NEEDED Patient not taking: Reported on 09/18/2023 06/20/21   Georgina Quint, MD    Allergies  Allergen Reactions   Iohexol      Desc: pt states in '80's wheezing; dyspnea w/ contrast--pt needs pre meds in future; slg 04/24/2008    Sulfa Antibiotics     Patient Active Problem List   Diagnosis Date  Noted   History of hyperglycemia 02/14/2022   Chronic pain of left knee 08/15/2021   Abnormal EKG 08/15/2021   Essential hypertension 08/10/2020   Dyslipidemia 08/10/2020   Seizure disorder (HCC) 08/10/2020   Unilateral primary osteoarthritis, left knee 06/12/2017   Malignant neoplasm of overlapping sites of right breast in female, estrogen receptor positive (HCC) 06/25/2014   History of breast cancer    Family history of ovarian cancer    Family history of breast cancer in female     Past Medical History:  Diagnosis Date   Allergy    Breast cancer (HCC)    Cancer (HCC)    Family history of breast cancer in female    Family history of ovarian cancer    History of breast cancer    Hypertension    Personal history of chemotherapy    Personal history of radiation therapy    Seizures (HCC)     Past Surgical History:  Procedure Laterality Date   ABDOMINAL HYSTERECTOMY     BREAST EXCISIONAL BIOPSY Right    BREAST LUMPECTOMY Right 2009   BREAST SURGERY     JOINT REPLACEMENT     SPINE SURGERY     laminectomy    Social History   Socioeconomic History   Marital status: Married    Spouse name: Not on file   Number of children: 2   Years of education: Not on file   Highest education level: Not  on file  Occupational History   Not on file  Tobacco Use   Smoking status: Never   Smokeless tobacco: Never  Vaping Use   Vaping status: Never Used  Substance and Sexual Activity   Alcohol use: No   Drug use: No   Sexual activity: Not on file  Other Topics Concern   Not on file  Social History Narrative   Not on file   Social Drivers of Health   Financial Resource Strain: Low Risk  (02/16/2023)   Overall Financial Resource Strain (CARDIA)    Difficulty of Paying Living Expenses: Not hard at all  Food Insecurity: No Food Insecurity (02/16/2023)   Hunger Vital Sign    Worried About Running Out of Food in the Last Year: Never true    Ran Out of Food in the Last Year: Never true   Transportation Needs: No Transportation Needs (02/16/2023)   PRAPARE - Administrator, Civil Service (Medical): No    Lack of Transportation (Non-Medical): No  Physical Activity: Unknown (02/16/2023)   Exercise Vital Sign    Days of Exercise per Week: 3 days    Minutes of Exercise per Session: Not on file  Stress: No Stress Concern Present (02/16/2023)   [employer] of Occupational Health - Occupational Stress Questionnaire    Feeling of Stress : Not at all  Social Connections: Unknown (02/16/2023)   Social Connection and Isolation Panel [NHANES]    Frequency of Communication with Friends and Family: More than three times a week    Frequency of Social Gatherings with Friends and Family: Patient declined    Attends Religious Services: Patient declined    Database administrator or Organizations: Yes    Attends Engineer, structural: More than 4 times per year    Marital Status: Married  Catering manager Violence: Not on file    Family History  Problem Relation Age of Onset   Ovarian cancer Mother 50       also uterine sarcoma   Other Father    Breast cancer Daughter 40   Breast cancer Maternal Aunt    Breast cancer Maternal Uncle        female breast cancer; deceased 34   Cancer Maternal Grandmother    Heart disease Maternal Grandfather    Hypertension Maternal Grandfather    Hypertension Paternal Grandfather    Breast cancer Cousin 41       Currently 68     Review of Systems  Constitutional: Negative.  Negative for chills and fever.  HENT:  Positive for congestion.   Respiratory: Negative.  Negative for cough and shortness of breath.   Cardiovascular: Negative.  Negative for chest pain and palpitations.  Gastrointestinal:  Negative for abdominal pain, diarrhea, nausea and vomiting.  Genitourinary: Negative.  Negative for dysuria and hematuria.  Skin: Negative.  Negative for rash.  Neurological: Negative.  Negative for dizziness and headaches.  All  other systems reviewed and are negative.   Vitals:   09/18/23 1021  BP: 124/74  Pulse: (!) 105  Temp: 98 F (36.7 C)  SpO2: 95%    Physical Exam Vitals reviewed.  Constitutional:      Appearance: Normal appearance.  HENT:     Head: Normocephalic.     Nose: Congestion present.     Mouth/Throat:     Mouth: Mucous membranes are moist.     Pharynx: Oropharynx is clear.  Eyes:     Extraocular Movements: Extraocular movements intact.  Conjunctiva/sclera: Conjunctivae normal.     Pupils: Pupils are equal, round, and reactive to light.  Cardiovascular:     Rate and Rhythm: Normal rate and regular rhythm.     Pulses: Normal pulses.     Heart sounds: Normal heart sounds.  Pulmonary:     Effort: Pulmonary effort is normal.     Breath sounds: Normal breath sounds.  Musculoskeletal:     Cervical back: No tenderness.  Lymphadenopathy:     Cervical: No cervical adenopathy.  Skin:    General: Skin is warm and dry.     Capillary Refill: Capillary refill takes less than 2 seconds.  Neurological:     General: No focal deficit present.     Mental Status: She is alert and oriented to person, place, and time.  Psychiatric:        Mood and Affect: Mood normal.        Behavior: Behavior normal.      ASSESSMENT & PLAN: A total of 46 minutes was spent with the patient and counseling/coordination of care regarding preparing for this visit, review of most recent office visit notes, review of multiple chronic medical conditions and their management, cardiovascular risks associated with hypertension and dyslipidemia, diagnosis of partially treated sinus infection and need for antibiotics, review of all medications, review of most recent bloodwork results, review of health maintenance items, education on nutrition, prognosis, documentation, and need for follow up.   Problem List Items Addressed This Visit       Cardiovascular and Mediastinum   Essential hypertension - Primary    Well-controlled hypertension Continue Benicar HCT 20-12.5 mg daily Cardiovascular risks associated with hypertension discussed Diet and nutrition discussed Dietary approaches to stop hypertension discussed Blood work done today Follow-up in 6 months      Relevant Orders   CBC with Differential/Platelet   Comprehensive metabolic panel   Hemoglobin A1c   Lipid panel     Respiratory   Acute non-recurrent maxillary sinusitis   Partially treated infection Started taking amoxicillin 2 days ago.  Recommend to continue and finish 7-day course Prescription for 500 mg amoxicillin 3 times daily for 7 days sent Clinically stable.  No red flag signs or symptoms.      Relevant Medications   amoxicillin (AMOXIL) 500 MG capsule     Other   Malignant neoplasm of overlapping sites of right breast in female, estrogen receptor positive (HCC)   Stable.  No concerns.      Relevant Medications   amoxicillin (AMOXIL) 500 MG capsule   Dyslipidemia   Stable chronic condition Lipid profile done today Continue atorvastatin 20 mg daily Diet and nutrition discussed      Relevant Orders   CBC with Differential/Platelet   Comprehensive metabolic panel   Hemoglobin A1c   Lipid panel   Patient Instructions  Health Maintenance After Age 58 After age 71, you are at a higher risk for certain long-term diseases and infections as well as injuries from falls. Falls are a major cause of broken bones and head injuries in people who are older than age 74. Getting regular preventive care can help to keep you healthy and well. Preventive care includes getting regular testing and making lifestyle changes as recommended by your health care provider. Talk with your health care provider about: Which screenings and tests you should have. A screening is a test that checks for a disease when you have no symptoms. A diet and exercise plan that is right for you. What  should I know about screenings and tests to prevent  falls? Screening and testing are the best ways to find a health problem early. Early diagnosis and treatment give you the best chance of managing medical conditions that are common after age 74. Certain conditions and lifestyle choices may make you more likely to have a fall. Your health care provider may recommend: Regular vision checks. Poor vision and conditions such as cataracts can make you more likely to have a fall. If you wear glasses, make sure to get your prescription updated if your vision changes. Medicine review. Work with your health care provider to regularly review all of the medicines you are taking, including over-the-counter medicines. Ask your health care provider about any side effects that may make you more likely to have a fall. Tell your health care provider if any medicines that you take make you feel dizzy or sleepy. Strength and balance checks. Your health care provider may recommend certain tests to check your strength and balance while standing, walking, or changing positions. Foot health exam. Foot pain and numbness, as well as not wearing proper footwear, can make you more likely to have a fall. Screenings, including: Osteoporosis screening. Osteoporosis is a condition that causes the bones to get weaker and break more easily. Blood pressure screening. Blood pressure changes and medicines to control blood pressure can make you feel dizzy. Depression screening. You may be more likely to have a fall if you have a fear of falling, feel depressed, or feel unable to do activities that you used to do. Alcohol use screening. Using too much alcohol can affect your balance and may make you more likely to have a fall. Follow these instructions at home: Lifestyle Do not drink alcohol if: Your health care provider tells you not to drink. If you drink alcohol: Limit how much you have to: 0-1 drink a day for women. 0-2 drinks a day for men. Know how much alcohol is in your drink.  In the [place] one drink equals one 12 oz bottle of beer (355 mL), one 5 oz glass of wine (148 mL), or one 1 oz glass of hard liquor (44 mL). Do not use any products that contain nicotine or tobacco. These products include cigarettes, chewing tobacco, and vaping devices, such as e-cigarettes. If you need help quitting, ask your health care provider. Activity  Follow a regular exercise program to stay fit. This will help you maintain your balance. Ask your health care provider what types of exercise are appropriate for you. If you need a cane or walker, use it as recommended by your health care provider. Wear supportive shoes that have nonskid soles. Safety  Remove any tripping hazards, such as rugs, cords, and clutter. Install safety equipment such as grab bars in bathrooms and safety rails on stairs. Keep rooms and walkways well-lit. General instructions Talk with your health care provider about your risks for falling. Tell your health care provider if: You fall. Be sure to tell your health care provider about all falls, even ones that seem minor. You feel dizzy, tiredness (fatigue), or off-balance. Take over-the-counter and prescription medicines only as told by your health care provider. These include supplements. Eat a healthy diet and maintain a healthy weight. A healthy diet includes low-fat dairy products, low-fat (lean) meats, and fiber from whole grains, beans, and lots of fruits and vegetables. Stay current with your vaccines. Schedule regular health, dental, and eye exams. Summary Having a healthy lifestyle and getting preventive care  can help to protect your health and wellness after age 76. Screening and testing are the best way to find a health problem early and help you avoid having a fall. Early diagnosis and treatment give you the best chance for managing medical conditions that are more common for people who are older than age 58. Falls are a major cause of broken bones and  head injuries in people who are older than age 76. Take precautions to prevent a fall at home. Work with your health care provider to learn what changes you can make to improve your health and wellness and to prevent falls. This information is not intended to replace advice given to you by your health care provider. Make sure you discuss any questions you have with your health care provider. Document Revised: 01/03/2021 Document Reviewed: 01/03/2021 Elsevier Patient Education  2024 Elsevier Inc.    Edwina Barth, MD [facility]

## 2023-09-18 NOTE — Assessment & Plan Note (Signed)
Stable chronic condition Lipid profile done today Continue atorvastatin 20 mg daily Diet and nutrition discussed

## 2023-09-18 NOTE — Assessment & Plan Note (Signed)
Stable.  No concerns. °

## 2023-09-18 NOTE — Assessment & Plan Note (Signed)
Partially treated infection Started taking amoxicillin 2 days ago.  Recommend to continue and finish 7-day course Prescription for 500 mg amoxicillin 3 times daily for 7 days sent Clinically stable.  No red flag signs or symptoms.

## 2023-09-18 NOTE — Assessment & Plan Note (Signed)
Well-controlled hypertension Continue Benicar HCT 20-12.5 mg daily Cardiovascular risks associated with hypertension discussed Diet and nutrition discussed Dietary approaches to stop hypertension discussed Blood work done today Follow-up in 6 months

## 2023-09-18 NOTE — Patient Instructions (Signed)
Health Maintenance After Age 76 After age 76, you are at a higher risk for certain long-term diseases and infections as well as injuries from falls. Falls are a major cause of broken bones and head injuries in people who are older than age 76. Getting regular preventive care can help to keep you healthy and well. Preventive care includes getting regular testing and making lifestyle changes as recommended by your health care provider. Talk with your health care provider about: Which screenings and tests you should have. A screening is a test that checks for a disease when you have no symptoms. A diet and exercise plan that is right for you. What should I know about screenings and tests to prevent falls? Screening and testing are the best ways to find a health problem early. Early diagnosis and treatment give you the best chance of managing medical conditions that are common after age 76. Certain conditions and lifestyle choices may make you more likely to have a fall. Your health care provider may recommend: Regular vision checks. Poor vision and conditions such as cataracts can make you more likely to have a fall. If you wear glasses, make sure to get your prescription updated if your vision changes. Medicine review. Work with your health care provider to regularly review all of the medicines you are taking, including over-the-counter medicines. Ask your health care provider about any side effects that may make you more likely to have a fall. Tell your health care provider if any medicines that you take make you feel dizzy or sleepy. Strength and balance checks. Your health care provider may recommend certain tests to check your strength and balance while standing, walking, or changing positions. Foot health exam. Foot pain and numbness, as well as not wearing proper footwear, can make you more likely to have a fall. Screenings, including: Osteoporosis screening. Osteoporosis is a condition that causes  the bones to get weaker and break more easily. Blood pressure screening. Blood pressure changes and medicines to control blood pressure can make you feel dizzy. Depression screening. You may be more likely to have a fall if you have a fear of falling, feel depressed, or feel unable to do activities that you used to do. Alcohol use screening. Using too much alcohol can affect your balance and may make you more likely to have a fall. Follow these instructions at home: Lifestyle Do not drink alcohol if: Your health care provider tells you not to drink. If you drink alcohol: Limit how much you have to: 0-1 drink a day for women. 0-2 drinks a day for men. Know how much alcohol is in your drink. In the [place] one drink equals one 12 oz bottle of beer (355 mL), one 5 oz glass of wine (148 mL), or one 1 oz glass of hard liquor (44 mL). Do not use any products that contain nicotine or tobacco. These products include cigarettes, chewing tobacco, and vaping devices, such as e-cigarettes. If you need help quitting, ask your health care provider. Activity  Follow a regular exercise program to stay fit. This will help you maintain your balance. Ask your health care provider what types of exercise are appropriate for you. If you need a cane or walker, use it as recommended by your health care provider. Wear supportive shoes that have nonskid soles. Safety  Remove any tripping hazards, such as rugs, cords, and clutter. Install safety equipment such as grab bars in bathrooms and safety rails on stairs. Keep rooms and walkways   well-lit. General instructions Talk with your health care provider about your risks for falling. Tell your health care provider if: You fall. Be sure to tell your health care provider about all falls, even ones that seem minor. You feel dizzy, tiredness (fatigue), or off-balance. Take over-the-counter and prescription medicines only as told by your health care provider. These include  supplements. Eat a healthy diet and maintain a healthy weight. A healthy diet includes low-fat dairy products, low-fat (lean) meats, and fiber from whole grains, beans, and lots of fruits and vegetables. Stay current with your vaccines. Schedule regular health, dental, and eye exams. Summary Having a healthy lifestyle and getting preventive care can help to protect your health and wellness after age 76. Screening and testing are the best way to find a health problem early and help you avoid having a fall. Early diagnosis and treatment give you the best chance for managing medical conditions that are more common for people who are older than age 76. Falls are a major cause of broken bones and head injuries in people who are older than age 76. Take precautions to prevent a fall at home. Work with your health care provider to learn what changes you can make to improve your health and wellness and to prevent falls. This information is not intended to replace advice given to you by your health care provider. Make sure you discuss any questions you have with your health care provider. Document Revised: 01/03/2021 Document Reviewed: 01/03/2021 Elsevier Patient Education  2024 Elsevier Inc.  

## 2023-10-24 DIAGNOSIS — H26493 Other secondary cataract, bilateral: Secondary | ICD-10-CM | POA: Diagnosis not present

## 2023-10-24 DIAGNOSIS — H40023 Open angle with borderline findings, high risk, bilateral: Secondary | ICD-10-CM | POA: Diagnosis not present

## 2023-10-24 DIAGNOSIS — H15111 Episcleritis periodica fugax, right eye: Secondary | ICD-10-CM | POA: Diagnosis not present

## 2023-10-24 DIAGNOSIS — H35033 Hypertensive retinopathy, bilateral: Secondary | ICD-10-CM | POA: Diagnosis not present

## 2023-11-01 ENCOUNTER — Encounter: Payer: Self-pay | Admitting: Emergency Medicine

## 2023-11-22 DIAGNOSIS — J340 Abscess, furuncle and carbuncle of nose: Secondary | ICD-10-CM | POA: Diagnosis not present

## 2023-11-22 DIAGNOSIS — J300 Vasomotor rhinitis: Secondary | ICD-10-CM | POA: Diagnosis not present

## 2023-11-30 ENCOUNTER — Encounter (HOSPITAL_BASED_OUTPATIENT_CLINIC_OR_DEPARTMENT_OTHER): Payer: Self-pay | Admitting: Student

## 2023-11-30 ENCOUNTER — Ambulatory Visit (HOSPITAL_BASED_OUTPATIENT_CLINIC_OR_DEPARTMENT_OTHER): Admitting: Student

## 2023-11-30 DIAGNOSIS — M79675 Pain in left toe(s): Secondary | ICD-10-CM

## 2023-11-30 NOTE — Progress Notes (Signed)
 Chief Complaint: Left big toe pain     History of Present Illness:    Shelly Simpson is a 78 y.o. female presenting today for evaluation of her left big toe.  Patient states that 2 days ago she was trimming the medial side of her toenail and accidentally cut the surrounding skin, causing it to bleed.  The bleeding has ceased however she has now developed some pain extending at the anterior of the ankle and lower leg.  Patient has some concern for developing infection.  Denies any redness, drainage, fever, or chills.  No pain in the posterior calf.   Surgical History:   None  PMH/PSH/Family History/Social History/Meds/Allergies:    Past Medical History:  Diagnosis Date   Allergy    Breast cancer (HCC)    Cancer (HCC)    Family history of breast cancer in female    Family history of ovarian cancer    History of breast cancer    Hypertension    Personal history of chemotherapy    Personal history of radiation therapy    Seizures (HCC)    Past Surgical History:  Procedure Laterality Date   ABDOMINAL HYSTERECTOMY     BREAST EXCISIONAL BIOPSY Right    BREAST LUMPECTOMY Right 2009   BREAST SURGERY     JOINT REPLACEMENT     SPINE SURGERY     laminectomy   Social History   Socioeconomic History   Marital status: Married    Spouse name: Not on file   Number of children: 2   Years of education: Not on file   Highest education level: Not on file  Occupational History   Not on file  Tobacco Use   Smoking status: Never   Smokeless tobacco: Never  Vaping Use   Vaping status: Never Used  Substance and Sexual Activity   Alcohol use: No   Drug use: No   Sexual activity: Not on file  Other Topics Concern   Not on file  Social History Narrative   Not on file   Social Drivers of Health   Financial Resource Strain: Low Risk  (02/16/2023)   Overall Financial Resource Strain (CARDIA)    Difficulty of Paying Living Expenses: Not hard at all   Food Insecurity: No Food Insecurity (02/16/2023)   Hunger Vital Sign    Worried About Running Out of Food in the Last Year: Never true    Ran Out of Food in the Last Year: Never true  Transportation Needs: No Transportation Needs (02/16/2023)   PRAPARE - Administrator, Civil Service (Medical): No    Lack of Transportation (Non-Medical): No  Physical Activity: Unknown (02/16/2023)   Exercise Vital Sign    Days of Exercise per Week: 3 days    Minutes of Exercise per Session: Not on file  Stress: No Stress Concern Present (02/16/2023)   [employer] of Occupational Health - Occupational Stress Questionnaire    Feeling of Stress : Not at all  Social Connections: Unknown (02/16/2023)   Social Connection and Isolation Panel [NHANES]    Frequency of Communication with Friends and Family: More than three times a week    Frequency of Social Gatherings with Friends and Family: Patient declined    Attends Religious Services: Patient declined    Active Member of  Clubs or Organizations: Yes    Attends Engineer, structural: More than 4 times per year    Marital Status: Married   Family History  Problem Relation Age of Onset   Ovarian cancer Mother 2       also uterine sarcoma   Other Father    Breast cancer Daughter 67   Breast cancer Maternal Aunt    Breast cancer Maternal Uncle        female breast cancer; deceased 25   Cancer Maternal Grandmother    Heart disease Maternal Grandfather    Hypertension Maternal Grandfather    Hypertension Paternal Grandfather    Breast cancer Cousin 100       Currently 66   Allergies  Allergen Reactions   Iohexol      Desc: pt states in '80's wheezing; dyspnea w/ contrast--pt needs pre meds in future; slg 04/24/2008    Sulfa Antibiotics    Current Outpatient Medications  Medication Sig Dispense Refill   albuterol (PROAIR HFA) 108 (90 Base) MCG/ACT inhaler Inhale 2 puffs into the lungs every 6 (six) hours as needed. (Patient not  taking: Reported on 09/18/2023) 18 g 3   Alum Hydroxide-Mag Carbonate (GAVISCON PO) Take by mouth. (Patient not taking: Reported on 09/18/2023)     atorvastatin (LIPITOR) 20 MG tablet Take 1 tablet (20 mg total) by mouth daily. 90 tablet 3   cetirizine (ZYRTEC) 10 MG tablet Take 10 mg by mouth daily as needed.      co-enzyme Q-10 30 MG capsule Take 30 mg by mouth daily.     fluticasone (FLONASE) 50 MCG/ACT nasal spray USE 2 SPRAYS IN EACH       NOSTRIL DAILY AS NEEDED (Patient not taking: Reported on 09/18/2023) 16 g 0   Multiple Vitamin (MULTIVITAMIN) capsule Take 1 capsule by mouth daily.     olmesartan-hydrochlorothiazide (BENICAR HCT) 20-12.5 MG tablet Take 1 tablet by mouth daily. 90 tablet 3   No current facility-administered medications for this visit.   No results found.  Review of Systems:   A ROS was performed including pertinent positives and negatives as documented in the HPI.  Physical Exam :   Constitutional: NAD and appears stated age Neurological: Alert and oriented Psych: Appropriate affect and cooperative There were no vitals taken for this visit.   Comprehensive Musculoskeletal Exam:    Exam of the left great toe shows a pinpoint laceration to the skin with overlying dried blood at the distal medial corner of the nailbed.  No surrounding warmth, erythema, or swelling.  Full ankle ROM with dorsiflexion plantarflexion.  Very mild swelling in the left lower leg compared to contralateral side without erythema.  Calf is supple and nontender with negative Homans' sign.  Dorsalis pedis 2+.  Imaging:     Assessment:   78 y.o. female with pain in the left ankle and lower leg following a small cut to her left great toe 2 days ago.  At this point there is no concern for a developing infection as there is no swelling, redness, warmth, or drainage around the toe.  She is having some pain in the anterior left ankle and lower leg that has developed since this injury, which is not well  explained based on today's exam.  Very low suspicion at this time for DVT.  Advised on red flag symptoms of developing infection and return precautions given.  Plan :    -Return to clinic as needed     I personally  saw and evaluated the patient, and participated in the management and treatment plan.  Hazle Nordmann, PA-C Orthopedics

## 2023-12-10 ENCOUNTER — Other Ambulatory Visit: Payer: Self-pay | Admitting: Physician Assistant

## 2024-03-13 DIAGNOSIS — M21622 Bunionette of left foot: Secondary | ICD-10-CM | POA: Diagnosis not present

## 2024-03-13 DIAGNOSIS — I739 Peripheral vascular disease, unspecified: Secondary | ICD-10-CM | POA: Diagnosis not present

## 2024-03-13 DIAGNOSIS — L565 Disseminated superficial actinic porokeratosis (DSAP): Secondary | ICD-10-CM | POA: Diagnosis not present

## 2024-03-21 ENCOUNTER — Other Ambulatory Visit: Payer: Self-pay | Admitting: Emergency Medicine

## 2024-03-21 DIAGNOSIS — Z1231 Encounter for screening mammogram for malignant neoplasm of breast: Secondary | ICD-10-CM

## 2024-04-08 ENCOUNTER — Ambulatory Visit
Admission: RE | Admit: 2024-04-08 | Discharge: 2024-04-08 | Disposition: A | Discharge status: Home / Self Care | Source: Ambulatory Visit | Attending: Emergency Medicine

## 2024-04-08 DIAGNOSIS — Z1231 Encounter for screening mammogram for malignant neoplasm of breast: Secondary | ICD-10-CM

## 2024-04-29 ENCOUNTER — Other Ambulatory Visit: Payer: Self-pay

## 2024-04-29 DIAGNOSIS — I10 Essential (primary) hypertension: Secondary | ICD-10-CM

## 2024-04-30 MED ORDER — ATORVASTATIN CALCIUM 20 MG PO TABS: 20.0000 mg | ORAL_TABLET | Freq: Every day | ORAL | 0 refills | Status: DC

## 2024-04-30 MED ORDER — OLMESARTAN MEDOXOMIL-HCTZ 20-12.5 MG PO TABS: 1.0000 | ORAL_TABLET | Freq: Every day | ORAL | 0 refills | Status: DC

## 2024-05-06 ENCOUNTER — Encounter: Payer: Self-pay | Admitting: Emergency Medicine

## 2024-05-09 ENCOUNTER — Other Ambulatory Visit: Payer: Self-pay | Admitting: Physician Assistant

## 2024-05-09 DIAGNOSIS — I10 Essential (primary) hypertension: Secondary | ICD-10-CM

## 2024-05-14 ENCOUNTER — Other Ambulatory Visit: Payer: Self-pay | Admitting: Physician Assistant

## 2024-05-14 ENCOUNTER — Telehealth: Payer: Self-pay | Admitting: Physician Assistant

## 2024-05-14 DIAGNOSIS — I10 Essential (primary) hypertension: Secondary | ICD-10-CM

## 2024-05-14 MED ORDER — OLMESARTAN MEDOXOMIL-HCTZ 20-12.5 MG PO TABS: 1.0000 | ORAL_TABLET | Freq: Every day | ORAL | 0 refills | Status: DC

## 2024-05-14 MED ORDER — ATORVASTATIN CALCIUM 20 MG PO TABS: 20.0000 mg | ORAL_TABLET | Freq: Every day | ORAL | 0 refills | Status: DC

## 2024-05-14 NOTE — Telephone Encounter (Signed)
 *  STAT* If patient is at the pharmacy, call can be transferred to refill team.   1. Which medications need to be refilled? (please list name of each medication and dose if known)  olmesartan -hydrochlorothiazide (BENICAR  HCT) 20-12.5 MG tablet  atorvastatin  (LIPITOR) 20 MG tablet   2. Which pharmacy/location (including street and city if local pharmacy) is medication to be sent to?  [id number] - [place], [place] - [address]    3. Do they need a 30 day or 90 day supply? 30 days  Patient has appointment on 10/27 with Orren Fabry

## 2024-05-14 NOTE — Telephone Encounter (Signed)
30 day supply sent in.

## 2024-05-15 DIAGNOSIS — L84 Corns and callosities: Secondary | ICD-10-CM | POA: Diagnosis not present

## 2024-06-05 ENCOUNTER — Other Ambulatory Visit: Payer: Self-pay | Admitting: Cardiology

## 2024-06-05 DIAGNOSIS — I10 Essential (primary) hypertension: Secondary | ICD-10-CM

## 2024-06-18 ENCOUNTER — Ambulatory Visit: Admitting: Physician Assistant

## 2024-06-20 NOTE — Progress Notes (Deleted)
 Cardiology Office Note:  .   Date:  06/20/2024  ID:  Shelly Simpson, DOB 02/07/1946, MRN [id number] PCP: Purcell Emil Schanz, MD  [facility] Providers Cardiologist:  None {  History of Present Illness: Shelly   Yurika KHARI Simpson is a 78 y.o. female with a past medical history of hypertension, seizure order and breast cancer who was referred by PCP for further evaluation of abnormal's EKG.  Patient was seen 12/22 in the ER after episode of sharp chest pain at home found to have EKG with normal sinus rhythm, possible inferior Q waves and poor R wave progression concerning for possible prior MI.  In the ER, troponins were negative and CXR without acute pathology.  Discharged home with plans to follow-up with cardiology for further management.  She was last seen March 2023 and at that time she felt fine.  Denied any prior MI or heart issues.  Currently not experiencing any cardiovascular symptoms.  She stated that in the ER, the person performing the EKG told her they placed the pads on wrong which may have caused the abnormality seen.  Inferior Q waves are not present on the current tracing.  Otherwise, denies any chest pain, shortness of breath, lightheadedness, lower extremity edema, orthopnea.  Typically she is an active person.  Limited by knee pain which she attributes to prior total knee arthroplasty.  Has history of breast cancer in 2009 which was treated with chemotherapy and radiation.  She was in remission and not taking any medication.  In her family, alcohol also had breast cancer.  At her last appointment she denied palpitations, chest pain, shortness of breath, peripheral edema.  No lightheadedness, headaches, syncope, orthopnea, or PND.  She confirmed that her cholesterol has not been checked recently.  Currently, taking atorvastatin  and co-Q10.  I saw her 02/2023, she tells me that she is making it day by day.  She had her right knee replaced and her left knee was giving her  some issues so she was given a shot (does not know if this is a steroid or gel shot).  Feeling much better.  She has been compliant with her medications and she is taking co-Q10.  LDL is 88 without history of CKD.  At goal.  She states that the EKG discrepancy listed in her chart was due to the incorrect placement of the stickers and that is why she had an abnormal EKG.  EKG stable today.  No chest pain, shortness of breath, palpitations.  Reports no shortness of breath nor dyspnea on exertion. Reports no chest pain, pressure, or tightness. No edema, orthopnea, PND. Reports no palpitations.   ROS: Pertinent ROS in HPI.  Studies Reviewed: .       CT cardiac scoring 11/21/21 IMPRESSION: Coronary calcium  score of 240 Agatston units. This was 79th percentile for age-, race-, and sex-matched controls.      Physical Exam:   VS:  There were no vitals taken for this visit.   Wt Readings from Last 3 Encounters:  09/18/23 209 lb (94.8 kg)  03/28/23 209 lb 6.4 oz (95 kg)  02/19/23 213 lb (96.6 kg)    GEN: Well nourished, well developed in no acute distress NECK: No JVD; No carotid bruits CARDIAC: RRR, no murmurs, rubs, gallops RESPIRATORY:  Clear to auscultation without rales, wheezing or rhonchi  ABDOMEN: Soft, non-tender, non-distended EXTREMITIES:  No edema; No deformity   ASSESSMENT AND PLAN: .    1.  Dyslipidemia -Continue current medications which  include Lipitor 80 mg daily, Zetia 10 mg daily -recent lab work fall off last year showed LDL 88, at goal -continue lipid lowering diet  3.  History of chemotherapy/Breast CA -In remission but still getting a yearly follow-up appointment  4.  Medical management -Continue current medications without any changes today  5.  Hypertension -Blood pressure well-controlled today, 122/62 -Continue current medications which include Benicar /HCTZ 20-12.5 mg daily -Continue to keep a blood pressure log at home      Dispo: She can follow-up in a  year  Signed, Orren LOISE Fabry, PA-C

## 2024-06-24 ENCOUNTER — Ambulatory Visit: Admitting: Physician Assistant

## 2024-06-30 ENCOUNTER — Encounter: Payer: Self-pay | Admitting: Radiology

## 2024-07-03 NOTE — Progress Notes (Unsigned)
 Cardiology Office Note    Date:  07/04/2024  ID:  Shelly Simpson, Shelly Simpson 07/06/1946, MRN [id number] PCP:  Purcell Emil Schanz, MD  Cardiologist:  None  Electrophysiologist:  None   Chief Complaint: Folllow up for HTN and coronary calcium    History of Present Illness: Shelly   Shelly Simpson is a 78 y.o. female with visit-pertinent history of hypertension, seizure order and breast cancer who was referred by PCP for further evaluation of abnormal's EKG.  In 07/2021 patient was seen in the ER after episodes of sharp chest pain at home, found to have ECG with normal sinus rhythm, possible inferior Q waves and poor R wave progression concerning for possible prior MI.  In the ER troponins were negative and chest x-ray without acute pathology.  She was discharged home with plans to follow-up with cardiology for further management.  She was seen by Dr. Hobart on 11/10/2021.  Patient reported she was doing well, denied any cardiovascular symptoms.  It was noted that she had breast cancer in 2009, was treated with chemotherapy and radiation.  She is in remission.  Echocardiogram on 11/21/2021 indicated LVEF 66 5%, no RWMA, severe asymmetric LVH of the basal septal segment, G1 DD, RV systolic function and size is normal, trivial mitral valve regurgitation, mild calcification of the aortic valve, mild thickening of the aortic valve.  CT calcium  score indicated a coronary calcium  score of 240 agitation units, 79th percentile for age, race and sex matched control.  Patient was last seen in clinic on 03/28/2023, reported that she had had her knee replaced and her left knee was also having problems.  No medication changes were made at time.  Today she presents for follow-up.  She reports that she has been doing well overall.  She denies any chest pain, shortness of breath, lower extremity edema, orthopnea or PND.  She denies any palpitations, presyncope or syncope.  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   She reports they do not intentionally exercise however do climb the stairs regularly in their house and play with her dog.  She reports that she currently has a cold and has been taking over-the-counter medications for relief, has noted some improvements. ROS: .   Today she denies chest pain, shortness of breath, lower extremity edema, fatigue, palpitations, melena, hematuria, hemoptysis, diaphoresis, weakness, presyncope, syncope, orthopnea, and PND.  All other systems are reviewed and otherwise negative. Studies Reviewed: Shelly   EKG:  EKG is ordered today, personally reviewed, demonstrating  EKG Interpretation Date/Time:  Friday July 04 2024 10:09:55 EST Ventricular Rate:  98 PR Interval:  162 QRS Duration:  90 QT Interval:  342 QTC Calculation: 436 R Axis:   -10  Text Interpretation: Sinus rhythm with occasional Premature ventricular complexes Low voltage QRS Right bundle branch block When compared with ECG of 28-Mar-2023 08:23, Premature ventricular complexes are now Present Borderline criteria for Anterior infarct are no longer Present Borderline criteria for Anterolateral infarct are no longer Present Confirmed by Shelly Simpson [id number]) on 07/04/2024 10:48:49 AM   CV Studies: Cardiac studies reviewed are outlined and summarized above. Otherwise please see EMR for full report. Cardiac Studies & Procedures   ______________________________________________________________________________________________     ECHOCARDIOGRAM  ECHOCARDIOGRAM COMPLETE 11/21/2021  Narrative ECHOCARDIOGRAM REPORT    Patient Name:   Shelly Simpson Date of Exam: 11/21/2021 Medical Rec #:  [id number]       Height:       65.0 in Accession #:  [id number]      Weight:       224.4 lb Date of Birth:  1945-11-17      BSA:          2.077 m Patient Age:    75 years        BP:           126/78 mmHg Patient Gender: F               HR:           82 bpm. Exam Location:  [address]  Procedure: 2D Echo, Cardiac  Doppler, Color Doppler and Strain Analysis  Indications:    Z92.21 H/o chemo  History:        Patient has prior history of Echocardiogram examinations, most recent 05/17/2009. Abnormal ECG, Breast cancer, Signs/Symptoms:H/o chemo; Risk Factors:Dyslipidemia.  Sonographer:    Elsie Bohr RDCS Referring Phys: [id number] POWELL FORBES SORROW   Sonographer Comments: Image acquisition challenging due to respiratory motion and Image acquisition challenging due to patient body habitus. IMPRESSIONS   1. Left ventricular ejection fraction, by estimation, is 60 to 65%. The left ventricle has normal function. The left ventricle has no regional wall motion abnormalities. There is severe asymmetric left ventricular hypertrophy of the basal-septal segment. Left ventricular diastolic parameters are consistent with Grade I diastolic dysfunction (impaired relaxation). 2. Right ventricular systolic function is normal. The right ventricular size is normal. 3. Normal RA size. Possible chiari network visualized. 4. The mitral valve is normal in structure. Trivial mitral valve regurgitation. 5. The aortic valve is tricuspid. There is mild calcification of the aortic valve. There is mild thickening of the aortic valve. Aortic valve regurgitation is not visualized. Aortic valve sclerosis/calcification is present, without any evidence of aortic stenosis. 6. The inferior vena cava is normal in size with greater than 50% respiratory variability, suggesting right atrial pressure of 3 mmHg.  Comparison(s): Compared to prior TTE report in 2010, there is no significant change.  FINDINGS Left Ventricle: Left ventricular ejection fraction, by estimation, is 60 to 65%. The left ventricle has normal function. The left ventricle has no regional wall motion abnormalities. Global longitudinal strain performed but not reported based on interpreter judgement due to suboptimal tracking. The left ventricular internal cavity size  was normal in size. There is severe asymmetric left ventricular hypertrophy of the basal-septal segment. Left ventricular diastolic parameters are consistent with Grade I diastolic dysfunction (impaired relaxation).  Right Ventricle: The right ventricular size is normal. No increase in right ventricular wall thickness. Right ventricular systolic function is normal.  Left Atrium: Left atrial size was normal in size.  Right Atrium: Right atrial size was normal in size. Possible chiari network seen on clip 59.  Pericardium: There is no evidence of pericardial effusion.  Mitral Valve: The mitral valve is normal in structure. There is mild thickening of the mitral valve leaflet(s). There is mild calcification of the mitral valve leaflet(s). Trivial mitral valve regurgitation.  Tricuspid Valve: The tricuspid valve is normal in structure. Tricuspid valve regurgitation is trivial.  Aortic Valve: The aortic valve is tricuspid. There is mild calcification of the aortic valve. There is mild thickening of the aortic valve. Aortic valve regurgitation is not visualized. Aortic valve sclerosis/calcification is present, without any evidence of aortic stenosis.  Pulmonic Valve: The pulmonic valve was normal in structure. Pulmonic valve regurgitation is mild.  Aorta: The aortic root is normal in size and structure.  Venous: The inferior vena cava is normal  in size with greater than 50% respiratory variability, suggesting right atrial pressure of 3 mmHg.  IAS/Shunts: The atrial septum is grossly normal.   LEFT VENTRICLE PLAX 2D LVIDd:         4.00 cm   Diastology LVIDs:         2.70 cm   LV e' medial:    7.51 cm/s LV PW:         1.10 cm   LV E/e' medial:  13.7 LV IVS:        1.10 cm   LV e' lateral:   10.70 cm/s LVOT diam:     2.10 cm   LV E/e' lateral: 9.6 LV SV:         89 LV SV Index:   43 LVOT Area:     3.46 cm   RIGHT VENTRICLE             IVC RV S prime:     14.10 cm/s  IVC diam: 1.70  cm TAPSE (M-mode): 1.6 cm RVSP:           19.6 mmHg  LEFT ATRIUM             Index        RIGHT ATRIUM           Index LA diam:        3.60 cm 1.73 cm/m   RA Pressure: 3.00 mmHg LA Vol (A2C):   49.1 ml 23.64 ml/m  RA Area:     13.20 cm LA Vol (A4C):   51.8 ml 24.94 ml/m  RA Volume:   31.80 ml  15.31 ml/m LA Biplane Vol: 53.2 ml 25.61 ml/m AORTIC VALVE LVOT Vmax:   105.00 cm/s LVOT Vmean:  75.300 cm/s LVOT VTI:    0.257 m  AORTA Ao Root diam: 3.20 cm Ao Asc diam:  3.50 cm  MITRAL VALVE                TRICUSPID VALVE MV Area (PHT): 3.42 cm     TR Peak grad:   16.6 mmHg MV Decel Time: 222 msec     TR Vmax:        204.00 cm/s MV E velocity: 103.00 cm/s  Estimated RAP:  3.00 mmHg MV A velocity: 119.00 cm/s  RVSP:           19.6 mmHg MV E/A ratio:  0.87 SHUNTS Systemic VTI:  0.26 m Systemic Diam: 2.10 cm  Powell Sorrow MD Electronically signed by Powell Sorrow MD Signature Date/Time: 11/21/2021/11:34:22 AM    Final      CT SCANS  CT CARDIAC SCORING (SELF PAY ONLY) 11/21/2021  Addendum 11/22/2021  8:45 AM ADDENDUM REPORT: 11/22/2021 08:42  EXAM: OVER-READ INTERPRETATION  CT CHEST  The following report is an over-read performed by radiologist Dr. Marcey Diones [facility] on 11/22/2021. This over-read does not include interpretation of cardiac or coronary anatomy or pathology. The coronary calcium  score interpretation by the cardiologist is attached.  COMPARISON:  CT of the chest on 04/25/2008  FINDINGS: No significant noncardiac vascular findings. Visualized mediastinum and hilar regions demonstrate no lymphadenopathy or masses. Visualized lungs show no evidence of pulmonary edema, consolidation, pneumothorax, nodule or pleural fluid. Visualized upper abdomen and bony structures are unremarkable. There is evidence of prior right breast surgery.  IMPRESSION: No significant incidental findings.   Electronically Signed By: Marcey Moan M.D. On: 11/22/2021 08:42  Narrative CLINICAL DATA:  Cardiovascular Disease Risk stratification  EXAM: Coronary Calcium  Score  TECHNIQUE: A gated, non-contrast computed tomography scan of the heart was performed using 3mm slice thickness. Axial images were analyzed on a dedicated workstation. Calcium  scoring of the coronary arteries was performed using the Agatston method.  MEDICATIONS: MEDICATIONS None  FINDINGS: Coronary arteries: Normal origins.  Coronary Calcium  Score:  Left main: 0  Left anterior descending artery: 240  Left circumflex artery: 0  Right coronary artery: 0  Total: 240  Percentile: 79th  Pericardium: Normal.  Ascending Aorta: Normal caliber.  Non-cardiac: See separate report from [facility].  IMPRESSION: Coronary calcium  score of 240 Agatston units. This was 79th percentile for age-, race-, and sex-matched controls.  RECOMMENDATIONS: Coronary artery calcium  (CAC) score is a strong predictor of incident coronary heart disease (CHD) and provides predictive information beyond traditional risk factors. CAC scoring is reasonable to use in the decision to withhold, postpone, or initiate statin therapy in intermediate-risk or selected borderline-risk asymptomatic adults (age 62-75 years and LDL-C >=70 to <190 mg/dL) who do not have diabetes or established atherosclerotic cardiovascular disease (ASCVD).* In intermediate-risk (10-year ASCVD risk >=7.5% to <20%) adults or selected borderline-risk (10-year ASCVD risk >=5% to <7.5%) adults in whom a CAC score is measured for the purpose of making a treatment decision the following recommendations have been made:  If CAC=0, it is reasonable to withhold statin therapy and reassess in 5 to 10 years, as long as higher risk conditions are absent (diabetes mellitus, family history of premature CHD in first degree relatives (males <55 years; females <65 years), cigarette smoking, or  LDL >=190 mg/dL).  If CAC is 1 to 99, it is reasonable to initiate statin therapy for patients >=28 years of age.  If CAC is >=100 or >=75th percentile, it is reasonable to initiate statin therapy at any age.  Cardiology referral should be considered for patients with CAC scores >=400 or >=75th percentile.  *2018 AHA/ACC/AACVPR/AAPA/ABC/ACPM/ADA/AGS/APhA/ASPC/NLA/PCNA Guideline on the Management of Blood Cholesterol: A Report of the American College of Cardiology/American Heart Association Task Force on Clinical Practice Guidelines. J Am Coll Cardiol. 2019;73(24):3168-3209.  Ezra Shuck, MD  Electronically Signed: By: Ezra Shuck M.D. On: 11/21/2021 15:19     ______________________________________________________________________________________________       Current Reported Medications:.    Current Meds  Medication Sig   albuterol  (PROAIR  HFA) 108 (90 Base) MCG/ACT inhaler Inhale 2 puffs into the lungs every 6 (six) hours as needed.   Multiple Vitamin (MULTIVITAMIN) capsule Take 1 capsule by mouth daily.   [DISCONTINUED] atorvastatin  (LIPITOR) 20 MG tablet Take 1 tablet (20 mg total) by mouth daily.   [DISCONTINUED] co-enzyme Q-10 30 MG capsule Take 30 mg by mouth daily.   [DISCONTINUED] olmesartan -hydrochlorothiazide (BENICAR  HCT) 20-12.5 MG tablet Take 1 tablet by mouth daily.   Physical Exam:    VS:  BP 110/66   Pulse 89   Ht 5' 5 (1.651 m)   Wt 211 lb (95.7 kg)   SpO2 97%   BMI 35.11 kg/m    Wt Readings from Last 3 Encounters:  07/04/24 211 lb (95.7 kg)  09/18/23 209 lb (94.8 kg)  03/28/23 209 lb 6.4 oz (95 kg)    GEN: Well nourished, well developed in no acute distress NECK: No JVD; No carotid bruits CARDIAC: RRR, no murmurs, rubs, gallops RESPIRATORY:  Clear to auscultation without rales, wheezing or rhonchi  ABDOMEN: Soft, non-tender, non-distended EXTREMITIES:  No edema; No acute deformity     Asessement and Plan:.    Coronary artery  calcification:CT calcium  score indicated a coronary calcium  score of 240 agitation units, 79th percentile for age, race and sex matched control. Stable with no anginal symptoms. No indication for ischemic evaluation.  Heart healthy diet and regular cardiovascular exercise encouraged. Reviewed ED precautions. Continue Lipitor 20 mg daily and olmesartan -hydrochlorothiazide 20-12.5 mg daily.  LVH: Echocardiogram on 11/21/2021 indicated LVEF 66 5%, no RWMA, severe asymmetric LVH of the basal septal segment, G1 DD. Today she denies any increased shortness of breath, lower extremity edema, presyncope or syncope.  Will repeat echocardiogram for further surveillance.  Hypertension: Blood pressure today 110/66.  Continue current antihypertensive regimen. Check BMET today.   Hyperlipidemia: Last lipid profile on 09/18/2023 indicated total cholesterol 141, HDL 49, triglycerides 143 and LDL 63.  Continue Lipitor 20 mg daily.  History of breast cancer s/p chemo: Last treatment in 2009. Patient underwent chemo and radiation.  She has yearly monitoring.   Disposition: F/u with Leul Narramore, NP in 8 weeks.   Signed, Idania Desouza D Denise Washburn, NP

## 2024-07-04 ENCOUNTER — Ambulatory Visit: Attending: Cardiology | Admitting: Cardiology

## 2024-07-04 ENCOUNTER — Encounter: Payer: Self-pay | Admitting: Cardiology

## 2024-07-04 VITALS — BP 110/66 | HR 89 | Ht 65.0 in | Wt 211.0 lb

## 2024-07-04 DIAGNOSIS — R931 Abnormal findings on diagnostic imaging of heart and coronary circulation: Secondary | ICD-10-CM

## 2024-07-04 DIAGNOSIS — E785 Hyperlipidemia, unspecified: Secondary | ICD-10-CM

## 2024-07-04 DIAGNOSIS — Z853 Personal history of malignant neoplasm of breast: Secondary | ICD-10-CM | POA: Diagnosis not present

## 2024-07-04 DIAGNOSIS — I10 Essential (primary) hypertension: Secondary | ICD-10-CM

## 2024-07-04 DIAGNOSIS — I517 Cardiomegaly: Secondary | ICD-10-CM

## 2024-07-04 LAB — BASIC METABOLIC PANEL WITH GFR
BUN/Creatinine Ratio: 20 (ref 12–28)
BUN: 19 mg/dL (ref 8–27)
CO2: 23 mmol/L (ref 20–29)
Calcium: 10.4 mg/dL — ABNORMAL HIGH (ref 8.7–10.3)
Chloride: 106 mmol/L (ref 96–106)
Creatinine, Ser: 0.96 mg/dL (ref 0.57–1.00)
Glucose: 128 mg/dL — ABNORMAL HIGH (ref 70–99)
Potassium: 4.3 mmol/L (ref 3.5–5.2)
Sodium: 144 mmol/L (ref 134–144)
eGFR: 61 mL/min/1.73 (ref >59)

## 2024-07-04 MED ORDER — ATORVASTATIN CALCIUM 20 MG PO TABS: 20.0000 mg | ORAL_TABLET | Freq: Every day | ORAL | 3 refills | Status: AC

## 2024-07-04 MED ORDER — OLMESARTAN MEDOXOMIL-HCTZ 20-12.5 MG PO TABS: 1.0000 | ORAL_TABLET | Freq: Every day | ORAL | 3 refills | Status: AC

## 2024-07-04 NOTE — Patient Instructions (Signed)
 Medication Instructions:  Continue all medications *If you need a refill on your cardiac medications before your next appointment, please call your pharmacy*  Lab Work: Bmet  today  Testing/Procedures: Echo   first available  Follow-Up: At [facility], you and your health needs are our priority.  As part of our continuing mission to provide you with exceptional heart care, our providers are all part of one team.  This team includes your primary Cardiologist (physician) and Advanced Practice Providers or APPs (Physician Assistants and Nurse Practitioners) who all work together to provide you with the care you need, when you need it.  Your next appointment:  8 weeks    Provider:  Katlyn West NP   S   Schedule appointment with Dr.Tobb in 5 months   We recommend signing up for the patient portal called MyChart.  Sign up information is provided on this After Visit Summary.  MyChart is used to connect with patients for Virtual Visits (Telemedicine).  Patients are able to view lab/test results, encounter notes, upcoming appointments, etc.  Non-urgent messages can be sent to your provider as well.   To learn more about what you can do with MyChart, go to [email or web address].

## 2024-07-07 ENCOUNTER — Ambulatory Visit: Payer: Self-pay | Admitting: Cardiology

## 2024-08-11 DIAGNOSIS — M25561 Pain in right knee: Secondary | ICD-10-CM | POA: Diagnosis not present

## 2024-08-12 ENCOUNTER — Ambulatory Visit (HOSPITAL_COMMUNITY): Admission: RE | Admit: 2024-08-12 | Discharge: 2024-08-12 | Attending: Internal Medicine | Admitting: Internal Medicine

## 2024-08-12 DIAGNOSIS — Z853 Personal history of malignant neoplasm of breast: Secondary | ICD-10-CM

## 2024-08-12 DIAGNOSIS — E785 Hyperlipidemia, unspecified: Secondary | ICD-10-CM

## 2024-08-12 DIAGNOSIS — I10 Essential (primary) hypertension: Secondary | ICD-10-CM

## 2024-08-12 DIAGNOSIS — I517 Cardiomegaly: Secondary | ICD-10-CM | POA: Insufficient documentation

## 2024-08-12 DIAGNOSIS — R9431 Abnormal electrocardiogram [ECG] [EKG]: Secondary | ICD-10-CM

## 2024-08-12 DIAGNOSIS — R931 Abnormal findings on diagnostic imaging of heart and coronary circulation: Secondary | ICD-10-CM

## 2024-08-12 LAB — ECHOCARDIOGRAM COMPLETE
Area-P 1/2: 4.06 cm2
S' Lateral: 2.45 cm

## 2024-08-26 NOTE — Progress Notes (Unsigned)
 "  Cardiology Office Note    Date:  08/27/2024  ID:  Shelly Simpson, Shelly Simpson October 04, 1945, MRN [id number] PCP:  Purcell Emil Schanz, MD  Cardiologist:  Georganna Archer, MD  Electrophysiologist:  None   Chief Complaint: Follow up for LVH  History of Present Illness: Shelly Simpson   Shelly Simpson is a 78 y.o. female with visit-pertinent history of  hypertension, seizure order and breast cancer who was referred by PCP for further evaluation of abnormal's EKG.   In 07/2021 patient was seen in the ER after episodes of sharp chest pain at home, found to have ECG with normal sinus rhythm, possible inferior Q waves and poor R wave progression concerning for possible prior MI.  In the ER troponins were negative and chest x-ray without acute pathology.  She was discharged home with plans to follow-up with cardiology for further management.  She was seen by Dr. Hobart on 11/10/2021.  Patient reported she was doing well, denied any cardiovascular symptoms.  It was noted that she had breast cancer in 2009, was treated with chemotherapy and radiation.  She is in remission.  Echocardiogram on 11/21/2021 indicated LVEF 66 5%, no RWMA, severe asymmetric LVH of the basal septal segment, G1 DD, RV systolic function and size is normal, trivial mitral valve regurgitation, mild calcification of the aortic valve, mild thickening of the aortic valve.  CT calcium  score indicated a coronary calcium  score of 240 agitation units, 79th percentile for age, race and sex matched control.   Patient was last seen in clinic on 03/28/2023, reported that she had had her knee replaced and her left knee was also having problems.  No medication changes were made at time.  Patient was last seen in clinic on 07/04/2024 for follow-up.  She remained stable from a cardiac standpoint.  Echocardiogram was ordered for further evaluation of prior severe LVH.  Echocardiogram on 08/12/2024 indicated LV EF 60 to 65%, no RWMA, moderate asymmetric LVH of the basal  septal segment, G1 DD, RV systolic function and size is normal, trivial mitral valve regurgitation, aortic valve regurgitation not well-visualized, no stenosis present.  Today she presents for follow-up with her husband.  She reports that she is doing well overall.  She denies any chest pain, shortness of breath, lower extremity edema, orthopnea or PND.  She denies any palpitations, presyncope or syncope.  Patient's does not typically do intentional exercise however does walk her dogs.  She and her husband deny any cardiac concerns or complaints today. ROS: .   Today she denies chest pain, shortness of breath, lower extremity edema, fatigue, palpitations, melena, hematuria, hemoptysis, diaphoresis, weakness, presyncope, syncope, orthopnea, and PND.  All other systems are reviewed and otherwise negative. Studies Reviewed: Shelly Simpson   EKG:  EKG is not ordered today.  CV Studies: Cardiac studies reviewed are outlined and summarized above. Otherwise please see EMR for full report. Cardiac Studies & Procedures   ______________________________________________________________________________________________     ECHOCARDIOGRAM  ECHOCARDIOGRAM COMPLETE 08/12/2024  Narrative ECHOCARDIOGRAM REPORT    Patient Name:   Shelly Simpson Date of Exam: 08/12/2024 Medical Rec #:  [id number]       Height:       65.0 in Accession #:    [id number]      Weight:       211.0 lb Date of Birth:  1946-02-22      BSA:          2.024 m Patient Age:    60 years  BP:           110/66 mmHg Patient Gender: F               HR:           87 bpm. Exam Location:  [address]  Procedure: 2D Echo, Cardiac Doppler and Color Doppler (Both Spectral and Color Flow Doppler were utilized during procedure).  Indications:    Abnormal ECG R94.31  History:        Patient has prior history of Echocardiogram examinations, most recent 11/21/2021. Risk Factors:Hypertension.  Sonographer:    Augustin Seals RDCS Referring Phys:  [id number] [facility] D Karry Causer   Sonographer Comments: Global longitudinal strain was attempted. IMPRESSIONS   1. Left ventricular ejection fraction, by estimation, is 60 to 65%. The left ventricle has normal function. The left ventricle has no regional wall motion abnormalities. There is moderate asymmetric left ventricular hypertrophy of the basal-septal segment. Left ventricular diastolic parameters are consistent with Grade I diastolic dysfunction (impaired relaxation). 2. Right ventricular systolic function is normal. The right ventricular size is normal. Tricuspid regurgitation signal is inadequate for assessing PA pressure. 3. The mitral valve is normal in structure. Trivial mitral valve regurgitation. 4. The aortic valve was not well visualized. Aortic valve regurgitation is not visualized. No aortic stenosis is present. 5. The inferior vena cava is normal in size with greater than 50% respiratory variability, suggesting right atrial pressure of 3 mmHg.  FINDINGS Left Ventricle: Left ventricular ejection fraction, by estimation, is 60 to 65%. The left ventricle has normal function. The left ventricle has no regional wall motion abnormalities. The left ventricular internal cavity size was small. There is moderate asymmetric left ventricular hypertrophy of the basal-septal segment. Left ventricular diastolic parameters are consistent with Grade I diastolic dysfunction (impaired relaxation).  Right Ventricle: The right ventricular size is normal. No increase in right ventricular wall thickness. Right ventricular systolic function is normal. Tricuspid regurgitation signal is inadequate for assessing PA pressure.  Left Atrium: Left atrial size was normal in size.  Right Atrium: Right atrial size was normal in size.  Pericardium: There is no evidence of pericardial effusion.  Mitral Valve: The mitral valve is normal in structure. Trivial mitral valve regurgitation.  Tricuspid Valve: The  tricuspid valve is normal in structure. Tricuspid valve regurgitation is trivial.  Aortic Valve: The aortic valve was not well visualized. Aortic valve regurgitation is not visualized. No aortic stenosis is present.  Pulmonic Valve: The pulmonic valve was not well visualized. Pulmonic valve regurgitation is not visualized.  Aorta: The aortic root and ascending aorta are structurally normal, with no evidence of dilitation.  Venous: The inferior vena cava is normal in size with greater than 50% respiratory variability, suggesting right atrial pressure of 3 mmHg.  IAS/Shunts: The interatrial septum was not well visualized.   LEFT VENTRICLE PLAX 2D LVIDd:         3.65 cm   Diastology LVIDs:         2.45 cm   LV e' medial:    6.31 cm/s LV PW:         1.10 cm   LV E/e' medial:  13.8 LV IVS:        1.10 cm   LV e' lateral:   9.46 cm/s LVOT diam:     2.10 cm   LV E/e' lateral: 9.2 LV SV:         50 LV SV Index:   25 LVOT Area:  3.46 cm   RIGHT VENTRICLE RV Basal diam:  2.55 cm RV S prime:     10.20 cm/s TAPSE (M-mode): 2.4 cm  LEFT ATRIUM             Index        RIGHT ATRIUM           Index LA diam:        3.25 cm 1.61 cm/m   RA Area:     11.90 cm LA Vol (A2C):   27.8 ml 13.74 ml/m  RA Volume:   25.00 ml  12.35 ml/m LA Vol (A4C):   38.9 ml 19.22 ml/m LA Biplane Vol: 35.7 ml 17.64 ml/m AORTIC VALVE LVOT Vmax:   73.90 cm/s LVOT Vmean:  48.700 cm/s LVOT VTI:    0.145 m  AORTA Ao Root diam: 3.10 cm Ao Asc diam:  3.50 cm  MITRAL VALVE MV Area (PHT): 4.06 cm     SHUNTS MV Decel Time: 187 msec     Systemic VTI:  0.14 m MV E velocity: 87.00 cm/s   Systemic Diam: 2.10 cm MV A velocity: 116.00 cm/s MV E/A ratio:  0.75  Lonni Nanas MD Electronically signed by Lonni Nanas MD Signature Date/Time: 08/12/2024/10:27:23 AM    Final      CT SCANS  CT CARDIAC SCORING (SELF PAY ONLY) 11/21/2021  Addendum 11/22/2021  8:45 AM ADDENDUM REPORT: 11/22/2021  08:42  EXAM: OVER-READ INTERPRETATION  CT CHEST  The following report is an over-read performed by radiologist Dr. Marcey Diones [facility] on 11/22/2021. This over-read does not include interpretation of cardiac or coronary anatomy or pathology. The coronary calcium  score interpretation by the cardiologist is attached.  COMPARISON:  CT of the chest on 04/25/2008  FINDINGS: No significant noncardiac vascular findings. Visualized mediastinum and hilar regions demonstrate no lymphadenopathy or masses. Visualized lungs show no evidence of pulmonary edema, consolidation, pneumothorax, nodule or pleural fluid. Visualized upper abdomen and bony structures are unremarkable. There is evidence of prior right breast surgery.  IMPRESSION: No significant incidental findings.   Electronically Signed By: Marcey Moan M.D. On: 11/22/2021 08:42  Narrative CLINICAL DATA:  Cardiovascular Disease Risk stratification  EXAM: Coronary Calcium  Score  TECHNIQUE: A gated, non-contrast computed tomography scan of the heart was performed using 3mm slice thickness. Axial images were analyzed on a dedicated workstation. Calcium  scoring of the coronary arteries was performed using the Agatston method.  MEDICATIONS: MEDICATIONS None  FINDINGS: Coronary arteries: Normal origins.  Coronary Calcium  Score:  Left main: 0  Left anterior descending artery: 240  Left circumflex artery: 0  Right coronary artery: 0  Total: 240  Percentile: 79th  Pericardium: Normal.  Ascending Aorta: Normal caliber.  Non-cardiac: See separate report from [facility].  IMPRESSION: Coronary calcium  score of 240 Agatston units. This was 79th percentile for age-, race-, and sex-matched controls.  RECOMMENDATIONS: Coronary artery calcium  (CAC) score is a strong predictor of incident coronary heart disease (CHD) and provides predictive information beyond traditional risk  factors. CAC scoring is reasonable to use in the decision to withhold, postpone, or initiate statin therapy in intermediate-risk or selected borderline-risk asymptomatic adults (age 69-75 years and LDL-C >=70 to <190 mg/dL) who do not have diabetes or established atherosclerotic cardiovascular disease (ASCVD).* In intermediate-risk (10-year ASCVD risk >=7.5% to <20%) adults or selected borderline-risk (10-year ASCVD risk >=5% to <7.5%) adults in whom a CAC score is measured for the purpose of making a treatment decision the following recommendations have been  made:  If CAC=0, it is reasonable to withhold statin therapy and reassess in 5 to 10 years, as long as higher risk conditions are absent (diabetes mellitus, family history of premature CHD in first degree relatives (males <55 years; females <65 years), cigarette smoking, or LDL >=190 mg/dL).  If CAC is 1 to 99, it is reasonable to initiate statin therapy for patients >=32 years of age.  If CAC is >=100 or >=75th percentile, it is reasonable to initiate statin therapy at any age.  Cardiology referral should be considered for patients with CAC scores >=400 or >=75th percentile.  *2018 AHA/ACC/AACVPR/AAPA/ABC/ACPM/ADA/AGS/APhA/ASPC/NLA/PCNA Guideline on the Management of Blood Cholesterol: A Report of the American College of Cardiology/American Heart Association Task Force on Clinical Practice Guidelines. J Am Coll Cardiol. 2019;73(24):3168-3209.  Ezra Shuck, MD  Electronically Signed: By: Ezra Shuck M.D. On: 11/21/2021 15:19     ______________________________________________________________________________________________       Current Reported Medications:.    Active Medications[1]  Physical Exam:    VS:  BP 122/66   Pulse 88   Ht 5' 5 (1.651 m)   Wt 212 lb (96.2 kg)   SpO2 99%   BMI 35.28 kg/m    Wt Readings from Last 3 Encounters:  08/27/24 212 lb (96.2 kg)  07/04/24 211 lb (95.7 kg)  09/18/23  209 lb (94.8 kg)    GEN: Well nourished, well developed in no acute distress NECK: No JVD; No carotid bruits CARDIAC: RRR, no murmurs, rubs, gallops RESPIRATORY:  Clear to auscultation without rales, wheezing or rhonchi  ABDOMEN: Soft, non-tender, non-distended EXTREMITIES:  No edema; No acute deformity     Asessement and Plan:.    Coronary artery calcification: CT calcium  score indicated a coronary calcium  score of 240 agitation units, 79th percentile for age, race and sex matched control. Stable with no anginal symptoms. No indication for ischemic evaluation.  Heart healthy diet and regular cardiovascular exercise encouraged.  Reviewed ED precautions. Continue Lipitor 20 mg daily, Benicar -HCTZ 20-12.5 mg daily.  LVH: Echo in 10/2021 indicated LVEF 60 to 65%, no RWMA, severe asymmetric LVH of the basal septal segment. Echocardiogram on 08/12/2024 indicated EF 66 5%, moderate asymmetric LVH with basal septal segment. Today she denies shortness of breath, lower extremity edema, presyncope or syncope.  Continue Benicar -HCTZ.  Hypertension: Blood pressure today 122/66. Continue current antihypertensive regimen.  Hyperlipidemia: Last with profile on 09/18/2023 indicated total cholesterol 141, HDL 49, triglycerides 143 and LDL 63.  Continue Lipitor 20 mg daily.   History of breast cancer s/p chemo:  Last treatment in 2009. Patient underwent chemo and radiation.  She has yearly monitoring.    Disposition: F/u with Dr. Floretta in one year or sooner if needed.   Signed, Dannielle Baskins D Daiki Dicostanzo, NP       [1]  Current Meds  Medication Sig   albuterol  (PROAIR  HFA) 108 (90 Base) MCG/ACT inhaler Inhale 2 puffs into the lungs every 6 (six) hours as needed.   atorvastatin  (LIPITOR) 20 MG tablet Take 1 tablet (20 mg total) by mouth daily.   Multiple Vitamin (MULTIVITAMIN) capsule Take 1 capsule by mouth daily.   olmesartan -hydrochlorothiazide (BENICAR  HCT) 20-12.5 MG tablet Take 1 tablet by mouth daily.   "

## 2024-08-27 ENCOUNTER — Encounter: Payer: Self-pay | Admitting: Cardiology

## 2024-08-27 ENCOUNTER — Ambulatory Visit: Admitting: Cardiology

## 2024-08-27 VITALS — BP 122/66 | HR 88 | Ht 65.0 in | Wt 212.0 lb

## 2024-08-27 DIAGNOSIS — Z853 Personal history of malignant neoplasm of breast: Secondary | ICD-10-CM

## 2024-08-27 DIAGNOSIS — I10 Essential (primary) hypertension: Secondary | ICD-10-CM

## 2024-08-27 DIAGNOSIS — R931 Abnormal findings on diagnostic imaging of heart and coronary circulation: Secondary | ICD-10-CM | POA: Diagnosis not present

## 2024-08-27 DIAGNOSIS — E785 Hyperlipidemia, unspecified: Secondary | ICD-10-CM | POA: Diagnosis not present

## 2024-08-27 DIAGNOSIS — I517 Cardiomegaly: Secondary | ICD-10-CM

## 2024-08-27 NOTE — Patient Instructions (Signed)
 Follow-Up: At [facility], you and your health needs are our priority.  As part of our continuing mission to provide you with exceptional heart care, our providers are all part of one team.  This team includes your primary Cardiologist (physician) and Advanced Practice Providers or APPs (Physician Assistants and Nurse Practitioners) who all work together to provide you with the care you need, when you need it.  Your next appointment:   1 year(s)  Provider:   Christena Covert, MD

## 2024-09-02 ENCOUNTER — Ambulatory Visit (INDEPENDENT_AMBULATORY_CARE_PROVIDER_SITE_OTHER)

## 2024-09-02 ENCOUNTER — Ambulatory Visit (HOSPITAL_BASED_OUTPATIENT_CLINIC_OR_DEPARTMENT_OTHER): Admitting: Student

## 2024-09-02 DIAGNOSIS — G8929 Other chronic pain: Secondary | ICD-10-CM | POA: Diagnosis not present

## 2024-09-02 DIAGNOSIS — Z96651 Presence of right artificial knee joint: Secondary | ICD-10-CM

## 2024-09-02 DIAGNOSIS — M25561 Pain in right knee: Secondary | ICD-10-CM | POA: Diagnosis not present

## 2024-09-02 NOTE — Progress Notes (Signed)
 "                                Chief Complaint: Right knee pain    Discussed the use of AI scribe software for clinical note transcription with the patient, who gave verbal consent to proceed.  History of Present Illness Shelly Simpson is a 79 year old female with prior right total knee arthroplasty who presents with right knee pain and swelling.  Right knee pain and swelling began about three weeks ago after increased activity with frequent stair climbing and household tasks. Pain is localized to the superior aspect of the right knee with mild swelling in the same region, described as soreness that worsens with activity, especially stairs. She denies recent trauma or injury and denies significant posterior or medial knee pain. She has rested the knee and has not used medications or topical treatments. She had a right total knee arthroplasty in 2006 and has not had follow-up or imaging for many years. She is concerned about overuse and possible damage to the prosthetic knee.   Surgical History:   Right knee TKA 2006  PMH/PSH/Family History/Social History/Meds/Allergies:    Past Medical History:  Diagnosis Date   Allergy    Breast cancer (HCC)    Cancer (HCC)    Family history of breast cancer in female    Family history of ovarian cancer    History of breast cancer    Hypertension    Personal history of chemotherapy    Personal history of radiation therapy    Seizures (HCC)    Past Surgical History:  Procedure Laterality Date   ABDOMINAL HYSTERECTOMY     BREAST EXCISIONAL BIOPSY Right    BREAST LUMPECTOMY Right 2009   BREAST SURGERY     JOINT REPLACEMENT     SPINE SURGERY     laminectomy   Social History   Socioeconomic History   Marital status: Married    Spouse name: Not on file   Number of children: 2   Years of education: Not on file   Highest education level: Not on file  Occupational History   Not on file  Tobacco Use   Smoking status: Never   Smokeless  tobacco: Never  Vaping Use   Vaping status: Never Used  Substance and Sexual Activity   Alcohol use: No   Drug use: No   Sexual activity: Not on file  Other Topics Concern   Not on file  Social History Narrative   Not on file   Social Drivers of Health   Tobacco Use: Low Risk (08/27/2024)   Patient History    Smoking Tobacco Use: Never    Smokeless Tobacco Use: Never    Passive Exposure: Not on file  Financial Resource Strain: Low Risk (02/16/2023)   Overall Financial Resource Strain (CARDIA)    Difficulty of Paying Living Expenses: Not hard at all  Food Insecurity: No Food Insecurity (02/16/2023)   Hunger Vital Sign    Worried About Running Out of Food in the Last Year: Never true    Ran Out of Food in the Last Year: Never true  Transportation Needs: No Transportation Needs (02/16/2023)   PRAPARE - Administrator, Civil Service (Medical): No    Lack of Transportation (Non-Medical): No  Physical Activity: Unknown (02/16/2023)   Exercise Vital Sign    Days of Exercise per Week: 3 days  Minutes of Exercise per Session: Not on file  Stress: No Stress Concern Present (02/16/2023)   [employer] of Occupational Health - Occupational Stress Questionnaire    Feeling of Stress : Not at all  Social Connections: Unknown (02/16/2023)   Social Connection and Isolation Panel    Frequency of Communication with Friends and Family: More than three times a week    Frequency of Social Gatherings with Friends and Family: Patient declined    Attends Religious Services: Patient declined    Database Administrator or Organizations: Yes    Attends Engineer, Structural: More than 4 times per year    Marital Status: Married  Depression (PHQ2-9): Low Risk (09/18/2023)   Depression (PHQ2-9)    PHQ-2 Score: 0  Alcohol Screen: Not on file  Housing: Low Risk (02/16/2023)   Housing    Last Housing Risk Score: 0  Utilities: Not on file  Health Literacy: Not on file   Family  History  Problem Relation Age of Onset   Ovarian cancer Mother 29       also uterine sarcoma   Other Father    Breast cancer Daughter 46   Breast cancer Maternal Aunt    Breast cancer Maternal Uncle        female breast cancer; deceased 33   Cancer Maternal Grandmother    Heart disease Maternal Grandfather    Hypertension Maternal Grandfather    Hypertension Paternal Grandfather    Breast cancer Cousin 70       Currently 66   Allergies[1] Current Outpatient Medications  Medication Sig Dispense Refill   albuterol  (PROAIR  HFA) 108 (90 Base) MCG/ACT inhaler Inhale 2 puffs into the lungs every 6 (six) hours as needed. 18 g 3   atorvastatin  (LIPITOR) 20 MG tablet Take 1 tablet (20 mg total) by mouth daily. 90 tablet 3   Multiple Vitamin (MULTIVITAMIN) capsule Take 1 capsule by mouth daily.     olmesartan -hydrochlorothiazide (BENICAR  HCT) 20-12.5 MG tablet Take 1 tablet by mouth daily. 90 tablet 3   No current facility-administered medications for this visit.   No results found.  Review of Systems:   A ROS was performed including pertinent positives and negatives as documented in the HPI.  Physical Exam :   Constitutional: NAD and appears stated age Neurological: Alert and oriented Psych: Appropriate affect and cooperative There were no vitals taken for this visit.   Comprehensive Musculoskeletal Exam:    Exam of the right knee demonstrates previous TKA scar anteriorly.  Active range of motion is from 0 to 120 degrees.  Stable collaterals with varus and valgus stress.  Patient reports tenderness more in the region of the quadriceps tendon without medial and lateral tenderness.  5/5 strength with resisted flexion and extension.  Minimal effusion without erythema or warmth.  Imaging:   Xray (right knee 4 views): Total knee arthroplasty components in good positioning without evidence of complication.  No acute fracture.   I personally reviewed and interpreted the radiographs.       Assessment & Plan Right knee pain Patient has a history of a right knee TKA in 2006.  The prosthesis is stable, and her symptoms are likely due to muscular or soft tissue inflammation from increased activity without recent injury. Conservative management is expected to improve symptoms. Recommend rest and activity modification to reduce mechanical strain. Discussed using topical diclofenac  gel for symptomatic relief, which is available over the counter. She should report any worsening pain, persistent symptoms,  or difficulty with weight-bearing. A conditional plan for further evaluation, including referral to a joint replacement specialist and additional imaging, is outlined if symptoms do not improve or worsen. Provided reassurance based on stable examination and imaging findings.     I personally saw and evaluated the patient, and participated in the management and treatment plan.  Leonce Reveal, PA-C Orthopedics     [1]  Allergies Allergen Reactions   Iohexol      Desc: pt states in '80's wheezing; dyspnea w/ contrast--pt needs pre meds in future; slg 04/24/2008    Sulfa Antibiotics    "
